# Patient Record
Sex: Female | Born: 1960 | Race: Black or African American | Hispanic: No | Marital: Single | Smoking: Never smoker
Health system: Southern US, Community
[De-identification: ages and names within clinical notes are randomized; demographics above are authoritative.]

## PROBLEM LIST (undated history)

## (undated) DIAGNOSIS — A048 Other specified bacterial intestinal infections: Secondary | ICD-10-CM

## (undated) DIAGNOSIS — M1990 Unspecified osteoarthritis, unspecified site: Secondary | ICD-10-CM

## (undated) DIAGNOSIS — Z8711 Personal history of peptic ulcer disease: Secondary | ICD-10-CM

## (undated) DIAGNOSIS — Z5189 Encounter for other specified aftercare: Secondary | ICD-10-CM

## (undated) DIAGNOSIS — I639 Cerebral infarction, unspecified: Secondary | ICD-10-CM

## (undated) DIAGNOSIS — E119 Type 2 diabetes mellitus without complications: Secondary | ICD-10-CM

## (undated) DIAGNOSIS — M5430 Sciatica, unspecified side: Secondary | ICD-10-CM

## (undated) DIAGNOSIS — E785 Hyperlipidemia, unspecified: Secondary | ICD-10-CM

## (undated) DIAGNOSIS — K219 Gastro-esophageal reflux disease without esophagitis: Secondary | ICD-10-CM

## (undated) DIAGNOSIS — Z8719 Personal history of other diseases of the digestive system: Secondary | ICD-10-CM

## (undated) HISTORY — PX: TUBAL LIGATION: SHX77

## (undated) HISTORY — DX: Encounter for other specified aftercare: Z51.89

## (undated) HISTORY — DX: Hyperlipidemia, unspecified: E78.5

## (undated) HISTORY — DX: Unspecified osteoarthritis, unspecified site: M19.90

---

## 2000-08-05 ENCOUNTER — Emergency Department (HOSPITAL_COMMUNITY): Admission: EM | Admit: 2000-08-05 | Discharge: 2000-08-05 | Payer: Self-pay | Admitting: Internal Medicine

## 2000-08-05 ENCOUNTER — Encounter: Payer: Self-pay | Admitting: Internal Medicine

## 2000-08-15 ENCOUNTER — Emergency Department (HOSPITAL_COMMUNITY): Admission: EM | Admit: 2000-08-15 | Discharge: 2000-08-15 | Payer: Self-pay | Admitting: Emergency Medicine

## 2004-04-22 ENCOUNTER — Emergency Department (HOSPITAL_COMMUNITY): Admission: EM | Admit: 2004-04-22 | Discharge: 2004-04-22 | Payer: Self-pay | Admitting: Emergency Medicine

## 2007-02-17 ENCOUNTER — Emergency Department (HOSPITAL_COMMUNITY): Admission: EM | Admit: 2007-02-17 | Discharge: 2007-02-17 | Payer: Self-pay | Admitting: Emergency Medicine

## 2007-10-08 ENCOUNTER — Emergency Department (HOSPITAL_COMMUNITY): Admission: EM | Admit: 2007-10-08 | Discharge: 2007-10-08 | Payer: Self-pay | Admitting: Emergency Medicine

## 2008-08-24 ENCOUNTER — Emergency Department (HOSPITAL_COMMUNITY): Admission: EM | Admit: 2008-08-24 | Discharge: 2008-08-24 | Payer: Self-pay | Admitting: Emergency Medicine

## 2010-05-11 ENCOUNTER — Emergency Department (HOSPITAL_COMMUNITY)
Admission: EM | Admit: 2010-05-11 | Discharge: 2010-05-11 | Payer: Self-pay | Source: Home / Self Care | Admitting: Emergency Medicine

## 2010-06-09 ENCOUNTER — Emergency Department (HOSPITAL_COMMUNITY)
Admission: EM | Admit: 2010-06-09 | Discharge: 2010-06-10 | Payer: Self-pay | Source: Home / Self Care | Admitting: Emergency Medicine

## 2010-06-14 LAB — CBC
HCT: 37.8 % (ref 36.0–46.0)
Hemoglobin: 12.4 g/dL (ref 12.0–15.0)
MCH: 27.8 pg (ref 26.0–34.0)
MCHC: 32.8 g/dL (ref 30.0–36.0)
MCV: 84.8 fL (ref 78.0–100.0)
Platelets: 233 10*3/uL (ref 150–400)
RBC: 4.46 MIL/uL (ref 3.87–5.11)
RDW: 13.3 % (ref 11.5–15.5)
WBC: 7.7 10*3/uL (ref 4.0–10.5)

## 2010-06-14 LAB — COMPREHENSIVE METABOLIC PANEL
ALT: 14 U/L (ref 0–35)
AST: 19 U/L (ref 0–37)
Albumin: 3.7 g/dL (ref 3.5–5.2)
Alkaline Phosphatase: 43 U/L (ref 39–117)
BUN: 6 mg/dL (ref 6–23)
CO2: 26 mEq/L (ref 19–32)
Calcium: 9.2 mg/dL (ref 8.4–10.5)
Chloride: 103 mEq/L (ref 96–112)
Creatinine, Ser: 0.81 mg/dL (ref 0.4–1.2)
GFR calc Af Amer: >60 mL/min (ref >60)
GFR calc non Af Amer: >60 mL/min (ref >60)
Glucose, Bld: 139 mg/dL — ABNORMAL HIGH (ref 70–99)
Potassium: 3.9 mEq/L (ref 3.5–5.1)
Sodium: 136 mEq/L (ref 135–145)
Total Bilirubin: 0.4 mg/dL (ref 0.3–1.2)
Total Protein: 7.2 g/dL (ref 6.0–8.3)

## 2010-06-14 LAB — DIFFERENTIAL
Basophils Absolute: 0 10*3/uL (ref 0.0–0.1)
Basophils Relative: 0 % (ref 0–1)
Eosinophils Absolute: 0.1 10*3/uL (ref 0.0–0.7)
Eosinophils Relative: 1 % (ref 0–5)
Lymphocytes Relative: 14 % (ref 12–46)
Lymphs Abs: 1.1 10*3/uL (ref 0.7–4.0)
Monocytes Absolute: 0.5 10*3/uL (ref 0.1–1.0)
Monocytes Relative: 7 % (ref 3–12)
Neutro Abs: 6.1 10*3/uL (ref 1.7–7.7)
Neutrophils Relative %: 79 % — ABNORMAL HIGH (ref 43–77)

## 2010-06-14 LAB — LIPASE, BLOOD: Lipase: 19 U/L (ref 11–59)

## 2011-03-10 LAB — COMPREHENSIVE METABOLIC PANEL
ALT: 21
AST: 32
Albumin: 4.3
Alkaline Phosphatase: 45
BUN: 4 — ABNORMAL LOW
CO2: 26
Calcium: 9.3
Chloride: 100
Creatinine, Ser: 0.88
GFR calc Af Amer: >60
GFR calc non Af Amer: >60
Glucose, Bld: 127 — ABNORMAL HIGH
Potassium: 4.6
Sodium: 134 — ABNORMAL LOW
Total Bilirubin: 1.1
Total Protein: 7.4

## 2011-03-10 LAB — DIFFERENTIAL
Basophils Absolute: 0
Basophils Relative: 0
Eosinophils Absolute: 0
Eosinophils Relative: 1
Lymphocytes Relative: 13
Lymphs Abs: 0.8
Monocytes Absolute: 0.2
Monocytes Relative: 4
Neutro Abs: 4.9
Neutrophils Relative %: 82 — ABNORMAL HIGH

## 2011-03-10 LAB — URINALYSIS, ROUTINE W REFLEX MICROSCOPIC
Bilirubin Urine: NEGATIVE
Glucose, UA: NEGATIVE
Hgb urine dipstick: NEGATIVE
Ketones, ur: >80 — AB
Nitrite: NEGATIVE
Protein, ur: 30 — AB
Specific Gravity, Urine: 1.03
Urobilinogen, UA: 1
pH: 7.5

## 2011-03-10 LAB — CBC
HCT: 40
Hemoglobin: 13.1
MCHC: 32.7
MCV: 85.1
Platelets: 214
RBC: 4.71
RDW: 13.8
WBC: 6

## 2011-03-10 LAB — LIPASE, BLOOD: Lipase: 16

## 2011-03-10 LAB — URINE MICROSCOPIC-ADD ON

## 2012-01-10 ENCOUNTER — Emergency Department (HOSPITAL_COMMUNITY)
Admission: EM | Admit: 2012-01-10 | Discharge: 2012-01-10 | Disposition: A | Discharge status: Home / Self Care | Payer: Self-pay | Attending: Emergency Medicine | Admitting: Emergency Medicine

## 2012-01-10 ENCOUNTER — Encounter (HOSPITAL_COMMUNITY): Payer: Self-pay | Admitting: Emergency Medicine

## 2012-01-10 ENCOUNTER — Emergency Department (HOSPITAL_COMMUNITY): Payer: Self-pay

## 2012-01-10 DIAGNOSIS — K219 Gastro-esophageal reflux disease without esophagitis: Secondary | ICD-10-CM | POA: Insufficient documentation

## 2012-01-10 DIAGNOSIS — J189 Pneumonia, unspecified organism: Secondary | ICD-10-CM | POA: Insufficient documentation

## 2012-01-10 HISTORY — DX: Sciatica, unspecified side: M54.30

## 2012-01-10 HISTORY — DX: Personal history of other diseases of the digestive system: Z87.19

## 2012-01-10 HISTORY — DX: Gastro-esophageal reflux disease without esophagitis: K21.9

## 2012-01-10 HISTORY — DX: Personal history of peptic ulcer disease: Z87.11

## 2012-01-10 LAB — CBC WITH DIFFERENTIAL/PLATELET
Basophils Absolute: 0.1 10*3/uL (ref 0.0–0.1)
Basophils Relative: 1 % (ref 0–1)
Eosinophils Absolute: 0.4 10*3/uL (ref 0.0–0.7)
Eosinophils Relative: 5 % (ref 0–5)
HCT: 34.9 % — ABNORMAL LOW (ref 36.0–46.0)
Hemoglobin: 11.4 g/dL — ABNORMAL LOW (ref 12.0–15.0)
Lymphocytes Relative: 17 % (ref 12–46)
Lymphs Abs: 1.4 10*3/uL (ref 0.7–4.0)
MCH: 27.5 pg (ref 26.0–34.0)
MCHC: 32.7 g/dL (ref 30.0–36.0)
MCV: 84.1 fL (ref 78.0–100.0)
Monocytes Absolute: 0.7 10*3/uL (ref 0.1–1.0)
Monocytes Relative: 9 % (ref 3–12)
Neutro Abs: 5.8 10*3/uL (ref 1.7–7.7)
Neutrophils Relative %: 69 % (ref 43–77)
Platelets: 394 10*3/uL (ref 150–400)
RBC: 4.15 MIL/uL (ref 3.87–5.11)
RDW: 13.3 % (ref 11.5–15.5)
WBC: 8.4 10*3/uL (ref 4.0–10.5)

## 2012-01-10 LAB — BASIC METABOLIC PANEL
BUN: 6 mg/dL (ref 6–23)
CO2: 27 mEq/L (ref 19–32)
Calcium: 9.3 mg/dL (ref 8.4–10.5)
Chloride: 103 mEq/L (ref 96–112)
Creatinine, Ser: 0.75 mg/dL (ref 0.50–1.10)
GFR calc Af Amer: >90 mL/min (ref >90)
GFR calc non Af Amer: >90 mL/min (ref >90)
Glucose, Bld: 92 mg/dL (ref 70–99)
Potassium: 4.2 mEq/L (ref 3.5–5.1)
Sodium: 138 mEq/L (ref 135–145)

## 2012-01-10 MED ORDER — AZITHROMYCIN 250 MG PO TABS
500.0000 mg | ORAL_TABLET | Freq: Once | ORAL | Status: AC
  Administered 2012-01-10: 500 mg via ORAL
  Filled 2012-01-10: qty 2

## 2012-01-10 MED ORDER — AZITHROMYCIN 250 MG PO TABS: 500.0000 mg | ORAL_TABLET | Freq: Every day | ORAL | Status: DC

## 2012-01-10 MED ORDER — PREDNISONE 20 MG PO TABS
60.0000 mg | ORAL_TABLET | Freq: Once | ORAL | Status: AC
  Administered 2012-01-10: 60 mg via ORAL
  Filled 2012-01-10: qty 3

## 2012-01-10 MED ORDER — ALBUTEROL SULFATE HFA 108 (90 BASE) MCG/ACT IN AERS
2.0000 | INHALATION_SPRAY | RESPIRATORY_TRACT | Status: DC | PRN
  Filled 2012-01-10: qty 6.7

## 2012-01-10 MED ORDER — ALBUTEROL SULFATE (5 MG/ML) 0.5% IN NEBU
5.0000 mg | INHALATION_SOLUTION | Freq: Once | RESPIRATORY_TRACT | Status: AC
  Administered 2012-01-10: 5 mg via RESPIRATORY_TRACT
  Filled 2012-01-10: qty 1

## 2012-01-10 MED ORDER — AZITHROMYCIN 500 MG PO TABS: 500.0000 mg | ORAL_TABLET | Freq: Every day | ORAL | Status: DC

## 2012-01-10 MED ORDER — IPRATROPIUM BROMIDE 0.02 % IN SOLN
0.5000 mg | Freq: Once | RESPIRATORY_TRACT | Status: AC
  Administered 2012-01-10: 0.5 mg via RESPIRATORY_TRACT
  Filled 2012-01-10: qty 2.5

## 2012-01-10 MED ORDER — AZITHROMYCIN 500 MG PO TABS: 500.0000 mg | ORAL_TABLET | Freq: Every day | ORAL | Status: AC

## 2012-01-10 MED ORDER — DEXTROSE 5 % IV SOLN: 500.0000 mg | Freq: Once | INTRAVENOUS | Status: DC

## 2012-01-10 NOTE — ED Notes (Signed)
Tight sounding cough started 1 week ago, unable to get rid of it. Lungs diminished all lobes

## 2012-01-10 NOTE — Progress Notes (Signed)
CM spoke with pt who confirms self pay [facility] resident with no pcp. Discussed the importance of a pcp for f/u. Reviewed Health connect number to assist with finding self pay provider close to pt's residence. Reviewed resources for [facility], general medical clinics, medications-[email or web address], [facility] out patient [facility]ies, housing, DSS, health Department and other resources in [employer] [employer]. Pt voiced understanding and appreciation of resources provided Pt states she was told to return in 2 weeks for a follow up to the [facility] ED by ED staff

## 2012-01-10 NOTE — ED Notes (Signed)
Pt c/o of dry cough that started about a week ago but has progressively gotten worse with today being the worse day.  Pt denies coughing up anything.  Pt reports SOB, gasping for air, some weakness "felt like my body broke down".  Pt reports headache 10/10.

## 2012-01-10 NOTE — ED Provider Notes (Signed)
History     CSN: [id number]  Arrival date & time 01/10/12  1344   First MD Initiated Contact with Patient 01/10/12 1456      Chief Complaint  Patient presents with  . Cough    (Consider location/radiation/quality/duration/timing/severity/associated sxs/prior treatment) HPI  Patient presents to the emergency department with complaints of cough started one week ago that she has been unable to read. She states that and to attend the cough is an. However, that he she also developed a low-grade fever 100, and pick, and he can't D. She denies feeling weak or having nausea, vomiting, diarrhea, headaches, COPD. The patient informs me that she does not feel well and decided to come and get evaluated. The patient does have low-grade temp, but otherwise her for are stable.  Past Medical History  Diagnosis Date  . GERD (gastroesophageal reflux disease)   . History of stomach ulcers   . Sciatica     History reviewed. No pertinent past surgical history.  History reviewed. No pertinent family history.  History  Substance Use Topics  . Smoking status: Never Smoker   . Smokeless tobacco: Not on file  . Alcohol Use: No    OB History    Grav Para Term Preterm Abortions TAB SAB Ect Mult Living                  Review of Systems   HEENT: denies blurry vision or change in hearing PULMONARY: + wheezing CARDIAC: denies chest pain or heart palpitations MUSCULOSKELETAL:  denies being unable to ambulate ABDOMEN AL: denies abdominal pain GU: denies loss of bowel or urinary control NEURO: denies numbness and tingling in extremities SKIN: no new rashes PSYCH: patient denies anxiety or depression. NECK: Pt denies having neck pain     Allergies  Penicillins  Home Medications   Current Outpatient Rx  Name Route Sig Dispense Refill  . TYLENOL COLD/FLU SEVERE PO Oral Take 1 tablet by mouth every 6 (six) hours as needed. For cold symptoms.    . AZITHROMYCIN 250 MG PO TABS Oral Take 2  tablets (500 mg total) by mouth daily. Take first 2 tablets together, then 1 every day until finished. 2 tablet 0    BP 116/70  Pulse 107  Temp 99.8 F (37.7 C) (Oral)  Resp 18  SpO2 97%  LMP 01/02/2012  Physical Exam  Nursing note and vitals reviewed. Constitutional: She appears well-developed and well-nourished. No distress.  HENT:  Head: Normocephalic and atraumatic.  Eyes: Pupils are equal, round, and reactive to light.  Neck: Normal range of motion. Neck supple.  Cardiovascular: Normal rate, regular rhythm and normal heart sounds.   No murmur heard. Pulmonary/Chest: Effort normal. No respiratory distress. She has wheezes. She has rales (left lower lobe). She exhibits no tenderness.  Abdominal: Soft. There is no tenderness.  Neurological: She is alert.  Skin: Skin is warm and dry.    ED Course  Procedures (including critical care time)  Labs Reviewed  CBC WITH DIFFERENTIAL - Abnormal; Notable for the following:    Hemoglobin 11.4 (*)     HCT 34.9 (*)     All other components within normal limits  BASIC METABOLIC PANEL   Dg Chest 2 View  01/10/2012  *RADIOLOGY REPORT*  Clinical Data: Cough, shortness of breath.  CHEST - 2 VIEW  Comparison: 10/08/2007  Findings: Consolidation in the left lower lobe compatible with pneumonia.  Right lung is clear.  No effusions.  No acute bony abnormality.  Heart is normal size.  The  IMPRESSION: Left lower lobe pneumonia.  Original Report Authenticated By: Cyndie Chime, M.D.     1. CAP (community acquired pneumonia)       MDM  Patients xray diagnostic of pneumonia. Pt not having high fevers, SOB, her pulse ox is 100 % in room air. After resting in room she is no longer tachycardic. Patient declines IV abx, she also declines IM abx. I was concerned about her compliance but she refuses to stay. I have ordered 50mg  PO Azithromycin in the ED and Azithromycin PO 500mg  x 2 days starting tomorrow (per up-to-date guidelines) in hopes that  she will take it for two days.  Patient is in stable condition and appears well. She has been given a breathing tx in ED as well an inhaler to go home with. No PCP, will return to the ER.  Pt has been advised of the symptoms that warrant their return to the ED. Patient has voiced understanding and has agreed to follow-up with the PCP or specialist.         Dorthula Matas, PA 01/11/12 2030

## 2012-01-12 ENCOUNTER — Emergency Department (HOSPITAL_COMMUNITY): Payer: Self-pay

## 2012-01-12 ENCOUNTER — Encounter (HOSPITAL_COMMUNITY): Payer: Self-pay | Admitting: Emergency Medicine

## 2012-01-12 ENCOUNTER — Emergency Department (HOSPITAL_COMMUNITY)
Admission: EM | Admit: 2012-01-12 | Discharge: 2012-01-12 | Disposition: A | Discharge status: Home / Self Care | Payer: Self-pay | Attending: Emergency Medicine | Admitting: Emergency Medicine

## 2012-01-12 DIAGNOSIS — J189 Pneumonia, unspecified organism: Secondary | ICD-10-CM | POA: Insufficient documentation

## 2012-01-12 DIAGNOSIS — J9801 Acute bronchospasm: Secondary | ICD-10-CM | POA: Insufficient documentation

## 2012-01-12 DIAGNOSIS — K219 Gastro-esophageal reflux disease without esophagitis: Secondary | ICD-10-CM | POA: Insufficient documentation

## 2012-01-12 LAB — CBC WITH DIFFERENTIAL/PLATELET
Basophils Absolute: 0.1 10*3/uL (ref 0.0–0.1)
Basophils Relative: 1 % (ref 0–1)
Eosinophils Absolute: 0.4 10*3/uL (ref 0.0–0.7)
Eosinophils Relative: 4 % (ref 0–5)
HCT: 37 % (ref 36.0–46.0)
Hemoglobin: 12.1 g/dL (ref 12.0–15.0)
Lymphocytes Relative: 28 % (ref 12–46)
Lymphs Abs: 2.3 10*3/uL (ref 0.7–4.0)
MCH: 27.6 pg (ref 26.0–34.0)
MCHC: 32.7 g/dL (ref 30.0–36.0)
MCV: 84.3 fL (ref 78.0–100.0)
Monocytes Absolute: 0.6 10*3/uL (ref 0.1–1.0)
Monocytes Relative: 7 % (ref 3–12)
Neutro Abs: 5 10*3/uL (ref 1.7–7.7)
Neutrophils Relative %: 60 % (ref 43–77)
Platelets: 410 10*3/uL — ABNORMAL HIGH (ref 150–400)
RBC: 4.39 MIL/uL (ref 3.87–5.11)
RDW: 13.4 % (ref 11.5–15.5)
WBC: 8.4 10*3/uL (ref 4.0–10.5)

## 2012-01-12 LAB — COMPREHENSIVE METABOLIC PANEL
ALT: 16 U/L (ref 0–35)
AST: 18 U/L (ref 0–37)
Albumin: 3 g/dL — ABNORMAL LOW (ref 3.5–5.2)
Alkaline Phosphatase: 58 U/L (ref 39–117)
BUN: 9 mg/dL (ref 6–23)
CO2: 27 mEq/L (ref 19–32)
Calcium: 9.1 mg/dL (ref 8.4–10.5)
Chloride: 100 mEq/L (ref 96–112)
Creatinine, Ser: 0.74 mg/dL (ref 0.50–1.10)
GFR calc Af Amer: >90 mL/min (ref >90)
GFR calc non Af Amer: >90 mL/min (ref >90)
Glucose, Bld: 102 mg/dL — ABNORMAL HIGH (ref 70–99)
Potassium: 3.6 mEq/L (ref 3.5–5.1)
Sodium: 136 mEq/L (ref 135–145)
Total Bilirubin: 0.2 mg/dL — ABNORMAL LOW (ref 0.3–1.2)
Total Protein: 7.3 g/dL (ref 6.0–8.3)

## 2012-01-12 MED ORDER — IPRATROPIUM BROMIDE 0.02 % IN SOLN
0.5000 mg | Freq: Once | RESPIRATORY_TRACT | Status: AC
  Administered 2012-01-12: 0.5 mg via RESPIRATORY_TRACT
  Filled 2012-01-12: qty 2.5

## 2012-01-12 MED ORDER — ALBUTEROL SULFATE (5 MG/ML) 0.5% IN NEBU
5.0000 mg | INHALATION_SOLUTION | Freq: Once | RESPIRATORY_TRACT | Status: AC
  Administered 2012-01-12: 5 mg via RESPIRATORY_TRACT
  Filled 2012-01-12: qty 1

## 2012-01-12 MED ORDER — MOXIFLOXACIN HCL 400 MG PO TABS
400.0000 mg | ORAL_TABLET | Freq: Once | ORAL | Status: AC
  Administered 2012-01-12: 400 mg via ORAL
  Filled 2012-01-12: qty 1

## 2012-01-12 NOTE — ED Provider Notes (Signed)
History     CSN: [id number]  Arrival date & time 01/12/12  0222   First MD Initiated Contact with Patient 01/12/12 [phone]      Chief Complaint  Patient presents with  . Shortness of Breath    The history is provided by the patient.   the patient reports she was recently diagnosed with pneumonia and is currently on azithromycin.  She presents now because of persistent worsening cough.  She still has mild shortness of breath but this has not changed from her prior evaluation.  She reports she's been trying her albuterol inhaler that was given to her without any relief.  She only used it about 2 times yesterday.  She denies fevers and chills.  She has no unilateral leg swelling.  She has no prior history of DVT or pulmonary embolism.  She's tolerating her azithromycin without difficulty.  Her main reason for presenting today is because she had 2 episodes of posttussive emesis reported that she could not stop coughing.  Past Medical History  Diagnosis Date  . GERD (gastroesophageal reflux disease)   . History of stomach ulcers   . Sciatica     History reviewed. No pertinent past surgical history.  No family history on file.  History  Substance Use Topics  . Smoking status: Never Smoker   . Smokeless tobacco: Not on file  . Alcohol Use: No    OB History    Grav Para Term Preterm Abortions TAB SAB Ect Mult Living                  Review of Systems  Respiratory: Positive for shortness of breath.   All other systems reviewed and are negative.    Allergies  Penicillins  Home Medications   Current Outpatient Rx  Name Route Sig Dispense Refill  . ALBUTEROL SULFATE HFA 108 (90 BASE) MCG/ACT IN AERS Inhalation Inhale 2 puffs into the lungs every 6 (six) hours as needed. For shortness of breath    . AZITHROMYCIN 500 MG PO TABS Oral Take 1 tablet (500 mg total) by mouth daily. 2 tablet 0  . TYLENOL COLD/FLU SEVERE PO Oral Take 1 tablet by mouth once. For cold symptoms.       BP 129/70  Pulse 83  Temp 98.9 F (37.2 C) (Oral)  Resp 16  SpO2 99%  LMP 01/02/2012  Physical Exam  Nursing note and vitals reviewed. Constitutional: She is oriented to person, place, and time. She appears well-developed and well-nourished. No distress.  HENT:  Head: Normocephalic and atraumatic.  Eyes: EOM are normal.  Neck: Normal range of motion.  Cardiovascular: Normal rate, regular rhythm and normal heart sounds.   Pulmonary/Chest: Effort normal. No respiratory distress. She has wheezes.  Abdominal: Soft. She exhibits no distension. There is no tenderness.  Musculoskeletal: Normal range of motion.  Neurological: She is alert and oriented to person, place, and time.  Skin: Skin is warm and dry.  Psychiatric: She has a normal mood and affect. Judgment normal.    ED Course  Procedures (including critical care time)  Labs Reviewed  CBC WITH DIFFERENTIAL - Abnormal; Notable for the following:    Platelets 410 (*)     All other components within normal limits  COMPREHENSIVE METABOLIC PANEL - Abnormal; Notable for the following:    Glucose, Bld 102 (*)     Albumin 3.0 (*)     Total Bilirubin 0.2 (*)     All other components within normal limits  Dg Chest 2 View  01/12/2012  *RADIOLOGY REPORT*  Clinical Data: Short of breath.  Pneumonia.  CHEST - 2 VIEW  Comparison: 01/10/2012.  Findings: Unchanged left lower lobe pneumonia with dense retrocardiac consolidation.  Right lung appears similar. Cardiopericardial silhouette within normal limits. Follow-up to ensure radiographic clearing recommended.  Clearing is usually observed at 8 weeks.  IMPRESSION: Unchanged left lower lobe pneumonia, as expected for this time frame.  Original Report Authenticated By: Andreas Newport, M.D.   Dg Chest 2 View  01/10/2012  *RADIOLOGY REPORT*  Clinical Data: Cough, shortness of breath.  CHEST - 2 VIEW  Comparison: 10/08/2007  Findings: Consolidation in the left lower lobe compatible with  pneumonia.  Right lung is clear.  No effusions.  No acute bony abnormality.  Heart is normal size.  The  IMPRESSION: Left lower lobe pneumonia.  Original Report Authenticated By: Cyndie Chime, M.D.    I personally reviewed the imaging tests through PACS system  I reviewed available ER/hospitalization records thought the EMR   1. Bronchospasm   2. CAP (community acquired pneumonia)       MDM  The patient had a significant amount spasm on exam.  She feels much better after albuterol and Atrovent nebulizers in the emergency department.  I instructed her that she is to use her albuterol inhaler every 4 hours for the next 2 days and as needed in between.  She reports she was not using it that often.  She does not smoke cigarettes.  She's given a dose of Avelox in the emergency department for what appears to be community-acquired pneumonia.  This should length another course of antibiotics for 24 hours.  She does not have insurance and reports she would not have the ability to pay for additional antibiotics and therefore cannot prescribe the patient Levaquin or Avelox.  Azithromycin shared he has and this is the best that she'll be able to afford.  Chest x-ray is unchanged.  O2 sats are 99% on room air   Lyanne Co, MD 01/12/12 [phone]

## 2012-01-12 NOTE — ED Notes (Signed)
Pt c/o SHOB x 1 week, emesis, cough. Pt seen here 8/13, pt states she is worse now.

## 2012-01-13 NOTE — ED Provider Notes (Signed)
Medical screening examination/treatment/procedure(s) were performed by non-physician practitioner and as supervising physician I was immediately available for consultation/collaboration.   Suzi Roots, MD 01/13/12 0600

## 2013-03-14 ENCOUNTER — Encounter (HOSPITAL_COMMUNITY): Payer: Self-pay | Admitting: Emergency Medicine

## 2013-03-14 ENCOUNTER — Emergency Department (HOSPITAL_COMMUNITY)
Admission: EM | Admit: 2013-03-14 | Discharge: 2013-03-14 | Disposition: A | Discharge status: Home / Self Care | Payer: No Typology Code available for payment source | Attending: Emergency Medicine | Admitting: Emergency Medicine

## 2013-03-14 ENCOUNTER — Emergency Department (HOSPITAL_COMMUNITY): Payer: No Typology Code available for payment source

## 2013-03-14 DIAGNOSIS — S46909A Unspecified injury of unspecified muscle, fascia and tendon at shoulder and upper arm level, unspecified arm, initial encounter: Secondary | ICD-10-CM | POA: Insufficient documentation

## 2013-03-14 DIAGNOSIS — Y92410 Unspecified street and highway as the place of occurrence of the external cause: Secondary | ICD-10-CM | POA: Insufficient documentation

## 2013-03-14 DIAGNOSIS — Y9389 Activity, other specified: Secondary | ICD-10-CM | POA: Insufficient documentation

## 2013-03-14 DIAGNOSIS — IMO0002 Reserved for concepts with insufficient information to code with codable children: Secondary | ICD-10-CM | POA: Insufficient documentation

## 2013-03-14 DIAGNOSIS — Z8739 Personal history of other diseases of the musculoskeletal system and connective tissue: Secondary | ICD-10-CM | POA: Insufficient documentation

## 2013-03-14 DIAGNOSIS — Z880 Allergy status to penicillin: Secondary | ICD-10-CM | POA: Insufficient documentation

## 2013-03-14 DIAGNOSIS — S298XXA Other specified injuries of thorax, initial encounter: Secondary | ICD-10-CM | POA: Insufficient documentation

## 2013-03-14 DIAGNOSIS — S4980XA Other specified injuries of shoulder and upper arm, unspecified arm, initial encounter: Secondary | ICD-10-CM | POA: Insufficient documentation

## 2013-03-14 DIAGNOSIS — Z8719 Personal history of other diseases of the digestive system: Secondary | ICD-10-CM | POA: Insufficient documentation

## 2013-03-14 MED ORDER — NAPROXEN SODIUM 220 MG PO TABS: 220.0000 mg | ORAL_TABLET | Freq: Two times a day (BID) | ORAL | Status: DC | PRN

## 2013-03-14 MED ORDER — KETOROLAC TROMETHAMINE 60 MG/2ML IM SOLN
60.0000 mg | Freq: Once | INTRAMUSCULAR | Status: DC
  Filled 2013-03-14: qty 2

## 2013-03-14 MED ORDER — IBUPROFEN 400 MG PO TABS
600.0000 mg | ORAL_TABLET | Freq: Once | ORAL | Status: AC
  Administered 2013-03-14: 600 mg via ORAL
  Filled 2013-03-14 (×2): qty 1

## 2013-03-14 MED ORDER — CYCLOBENZAPRINE HCL 10 MG PO TABS: 10.0000 mg | ORAL_TABLET | Freq: Two times a day (BID) | ORAL | Status: DC | PRN
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
## 2013-03-14 NOTE — ED Notes (Signed)
Pt returned from xray
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
## 2013-03-19 NOTE — ED Provider Notes (Signed)
Medical screening examination/treatment/procedure(s) were performed by non-physician practitioner and as supervising physician I was immediately available for consultation/collaboration.  Severo Beber Y. Breck [place], MD 03/19/13 0043 

## 2013-10-12 ENCOUNTER — Encounter (HOSPITAL_COMMUNITY): Payer: Self-pay | Admitting: Emergency Medicine

## 2013-10-12 ENCOUNTER — Emergency Department (HOSPITAL_COMMUNITY)
Admission: EM | Admit: 2013-10-12 | Discharge: 2013-10-12 | Disposition: A | Discharge status: Home / Self Care | Payer: No Typology Code available for payment source | Attending: Emergency Medicine | Admitting: Emergency Medicine

## 2013-10-12 DIAGNOSIS — Z880 Allergy status to penicillin: Secondary | ICD-10-CM | POA: Insufficient documentation

## 2013-10-12 DIAGNOSIS — Z8739 Personal history of other diseases of the musculoskeletal system and connective tissue: Secondary | ICD-10-CM | POA: Insufficient documentation

## 2013-10-12 DIAGNOSIS — K0381 Cracked tooth: Secondary | ICD-10-CM | POA: Insufficient documentation

## 2013-10-12 DIAGNOSIS — K089 Disorder of teeth and supporting structures, unspecified: Secondary | ICD-10-CM | POA: Insufficient documentation

## 2013-10-12 DIAGNOSIS — H9209 Otalgia, unspecified ear: Secondary | ICD-10-CM | POA: Insufficient documentation

## 2013-10-12 DIAGNOSIS — H9202 Otalgia, left ear: Secondary | ICD-10-CM

## 2013-10-12 DIAGNOSIS — K0889 Other specified disorders of teeth and supporting structures: Secondary | ICD-10-CM

## 2013-10-12 DIAGNOSIS — Z8719 Personal history of other diseases of the digestive system: Secondary | ICD-10-CM | POA: Insufficient documentation

## 2013-10-12 MED ORDER — ANTIPYRINE-BENZOCAINE 5.4-1.4 % OT SOLN
3.0000 [drp] | Freq: Once | OTIC | Status: AC
  Administered 2013-10-12: 4 [drp] via OTIC
  Filled 2013-10-12: qty 10

## 2013-10-12 MED ORDER — CLINDAMYCIN HCL 150 MG PO CAPS: 300.0000 mg | ORAL_CAPSULE | Freq: Three times a day (TID) | ORAL | Status: DC

## 2013-10-12 MED ORDER — TRAMADOL HCL 50 MG PO TABS: 50.0000 mg | ORAL_TABLET | Freq: Four times a day (QID) | ORAL | Status: DC | PRN

## 2013-10-12 NOTE — Discharge Instructions (Signed)
Dental Pain A tooth ache may be caused by cavities (tooth decay). Cavities expose the nerve of the tooth to air and hot or cold temperatures. It may come from an infection or abscess (also called a boil or furuncle) around your tooth. It is also often caused by dental caries (tooth decay). This causes the pain you are having. DIAGNOSIS  Your caregiver can diagnose this problem by exam. TREATMENT   If caused by an infection, it may be treated with medications which kill germs (antibiotics) and pain medications as prescribed by your caregiver. Take medications as directed.  Only take over-the-counter or prescription medicines for pain, discomfort, or fever as directed by your caregiver.  Whether the tooth ache today is caused by infection or dental disease, you should see your dentist as soon as possible for further care. SEEK MEDICAL CARE IF: The exam and treatment you received today has been provided on an emergency basis only. This is not a substitute for complete medical or dental care. If your problem worsens or new problems (symptoms) appear, and you are unable to meet with your dentist, call or return to this location. SEEK IMMEDIATE MEDICAL CARE IF:   You have a fever.  You develop redness and swelling of your face, jaw, or neck.  You are unable to open your mouth.  You have severe pain uncontrolled by pain medicine. MAKE SURE YOU:   Understand these instructions.  Will watch your condition.  Will get help right away if you are not doing well or get worse. Document Released: 05/16/2005 Document Revised: 08/08/2011 Document Reviewed: 01/02/2008 [facility] Patient Information 2014 [place], [place].  Otalgia The most common reason for this in children is an infection of the middle ear. Pain from the middle ear is usually caused by a build-up of fluid and pressure behind the eardrum. Pain from an earache can be sharp, dull, or burning. The pain may be temporary or constant. The middle  ear is connected to the nasal passages by a short narrow tube called the Eustachian tube. The Eustachian tube allows fluid to drain out of the middle ear, and helps keep the pressure in your ear equalized. CAUSES  A cold or allergy can block the Eustachian tube with inflammation and the build-up of secretions. This is especially likely in small children, because their Eustachian tube is shorter and more horizontal. When the Eustachian tube closes, the normal flow of fluid from the middle ear is stopped. Fluid can accumulate and cause stuffiness, pain, hearing loss, and an ear infection if germs start growing in this area. SYMPTOMS  The symptoms of an ear infection may include fever, ear pain, fussiness, increased crying, and irritability. Many children will have temporary and minor hearing loss during and right after an ear infection. Permanent hearing loss is rare, but the risk increases the more infections a child has. Other causes of ear pain include retained water in the outer ear canal from swimming and bathing. Ear pain in adults is less likely to be from an ear infection. Ear pain may be referred from other locations. Referred pain may be from the joint between your jaw and the skull. It may also come from a tooth problem or problems in the neck. Other causes of ear pain include:  A foreign body in the ear.  Outer ear infection.  Sinus infections.  Impacted ear wax.  Ear injury.  Arthritis of the jaw or TMJ problems.  Middle ear infection.  Tooth infections.  Sore throat with  pain to the ears. DIAGNOSIS  Your caregiver can usually make the diagnosis by examining you. Sometimes other special studies, including x-rays and lab work may be necessary. TREATMENT   If antibiotics were prescribed, use them as directed and finish them even if you or your child's symptoms seem to be improved.  Sometimes PE tubes are needed in children. These are little plastic tubes which are put into the  eardrum during a simple surgical procedure. They allow fluid to drain easier and allow the pressure in the middle ear to equalize. This helps relieve the ear pain caused by pressure changes. HOME CARE INSTRUCTIONS   Only take over-the-counter or prescription medicines for pain, discomfort, or fever as directed by your caregiver. DO NOT GIVE CHILDREN ASPIRIN because of the association of Reye's Syndrome in children taking aspirin.  Use a cold pack applied to the outer ear for 15-20 minutes, 03-04 times per day or as needed may reduce pain. Do not apply ice directly to the skin. You may cause frost bite.  Over-the-counter ear drops used as directed may be effective. Your caregiver may sometimes prescribe ear drops.  Resting in an upright position may help reduce pressure in the middle ear and relieve pain.  Ear pain caused by rapidly descending from high altitudes can be relieved by swallowing or chewing gum. Allowing infants to suck on a bottle during airplane travel can help.  Do not smoke in the house or near children. If you are unable to quit smoking, smoke outside.  Control allergies. SEEK IMMEDIATE MEDICAL CARE IF:   You or your child are becoming sicker.  Pain or fever relief is not obtained with medicine.  You or your child's symptoms (pain, fever, or irritability) do not improve within 24 to 48 hours or as instructed.  Severe pain suddenly stops hurting. This may indicate a ruptured eardrum.  You or your children develop new problems such as severe headaches, stiff neck, difficulty swallowing, or swelling of the face or around the ear. Document Released: 01/01/2004 Document Revised: 08/08/2011 Document Reviewed: 05/07/2008 [facility]Exit[facility] Patient Information 2014 [place]ExitCare, [place].

## 2013-10-12 NOTE — ED Notes (Signed)
Pt dissatisfied with discharge instructions and prescriptions.  Pt states she has had left ear pain x 5 weeks and wants to know what the cause is.  Advised PA, PA in room to talk with pt.

## 2013-10-12 NOTE — ED Provider Notes (Signed)
CSN: [id number]     Arrival date & time 10/12/13  1442 History  This chart was scribed for non-physician practitioner Junious SilkHannah Beckett Maden, PA-C working with Juliet RudeNathan R. Rubin PayorPickering, MD by Dorothey Basemania Sutton, ED Scribe. This patient was seen in room TR11C/TR11C and the patient's care was started at 3:49 PM.    Chief Complaint  Patient presents with  . Otalgia   The history is provided by the patient. No language interpreter was used.   HPI Comments: Heather Cook is a 53 y.o. female who presents to the Emergency Department complaining of an intermittent pain to the left ear that she states has been ongoing for the past 5 weeks, but has been progressively worsening since last night. Patient states that she notices the pain most after being outside. Patient reports some pain to the left, lower dentition. She denies taking any medications at home to treat her pain. She denies ear drainage, fever, chills, rhinorrhea, congestion, cough, shortness of breath. Patient has an allergy to penicillins. Patient has a history of sciatica, GERD, and stomach ulcers.   Past Medical History  Diagnosis Date  . GERD (gastroesophageal reflux disease)   . History of stomach ulcers   . Sciatica    History reviewed. No pertinent past surgical history. History reviewed. No pertinent family history. History  Substance Use Topics  . Smoking status: Never Smoker   . Smokeless tobacco: Not on file  . Alcohol Use: No   OB History   Grav Para Term Preterm Abortions TAB SAB Ect Mult Living                 Review of Systems  Constitutional: Negative for fever and chills.  HENT: Positive for dental problem and ear pain. Negative for congestion, ear discharge and rhinorrhea.   Respiratory: Negative for cough and shortness of breath.   All other systems reviewed and are negative.  Allergies  Other; Peanuts; Penicillins; and Strawberry  Home Medications   Prior to Admission medications   Not on File   Triage Vitals: BP 125/64   Pulse 94  Temp(Src) 98.2 F (36.8 C) (Oral)  Resp 16  Ht 5' 3.5" (1.613 m)  Wt 192 lb (87.091 kg)  BMI 33.47 kg/m2  SpO2 100%  LMP 09/28/2013  Physical Exam  Nursing note and vitals reviewed. Constitutional: She is oriented to person, place, and time. She appears well-developed and well-nourished. No distress.  HENT:  Head: Normocephalic and atraumatic.  Right Ear: Hearing, tympanic membrane, external ear and ear canal normal. No mastoid tenderness.  Left Ear: Hearing, tympanic membrane, external ear and ear canal normal. No mastoid tenderness.  Nose: Nose normal.  Mouth/Throat: Oropharynx is clear and moist. No oropharyngeal exudate.  Broken lower, left molar. No trismus, submental edema, or tongue elevation.   Eyes: Conjunctivae are normal.  Neck: Normal range of motion.  Cardiovascular: Normal rate, regular rhythm and normal heart sounds.   Pulmonary/Chest: Effort normal and breath sounds normal. No stridor. No respiratory distress. She has no wheezes. She has no rales.  Abdominal: Soft. She exhibits no distension.  Musculoskeletal: Normal range of motion.  Neurological: She is alert and oriented to person, place, and time. She has normal strength.  Skin: Skin is warm and dry. She is not diaphoretic. No erythema.  Psychiatric: She has a normal mood and affect. Her behavior is normal.    ED Course  Procedures (including critical care time)  DIAGNOSTIC STUDIES: Oxygen Saturation is 100% on room air, normal by my interpretation.  COORDINATION OF CARE: 3:54 PM- Discussed that there are no signs of infection at this time. Will discharge patient with Auralgan to manage symptoms. Advised patient to follow up with the referred ENT specialist. Discussed treatment plan with patient at bedside and patient verbalized agreement.     Labs Review Labs Reviewed - No data to display  Imaging Review No results found.   EKG Interpretation None      MDM   Final diagnoses:   Left ear pain  Pain, dental    Patient with toothache.  No gross abscess.  Exam unconcerning for Ludwig's angina or spread of infection.  Will treat with penicillin and pain medicine.  Urged patient to follow-up with dentist.  Patient also with otalgia. This could be radiating from tooth. Ear does not appear infected. No mastoid tenderness. Patient is unhappy with this answer. I offered to have my attending physician to evaluate her and offer a second opinion as she was very upset, but patient declined. She was given referral to ENT. Return instructions given. Vital signs stable for discharge. Patient / Family / Caregiver informed of clinical course, understand medical decision-making process, and agree with plan.   I personally performed the services described in this documentation, which was scribed in my presence. The recorded information has been reviewed and is accurate.    Mora BellmanHannah S Nicholas Ossa, PA-C 10/14/13 1246

## 2013-10-12 NOTE — ED Notes (Signed)
Per pt sts 5 weeks of left ear pain. sts pain got bad last night. sts she noticed that it happens after she has been outside.

## 2013-10-17 NOTE — ED Provider Notes (Signed)
Medical screening examination/treatment/procedure(s) were performed by non-physician practitioner and as supervising physician I was immediately available for consultation/collaboration.   EKG Interpretation None       Juliet RudeNathan R. Rubin PayorPickering, MD 10/17/13 [phone]

## 2014-03-14 ENCOUNTER — Encounter (HOSPITAL_COMMUNITY): Payer: Self-pay | Admitting: Emergency Medicine

## 2014-03-14 ENCOUNTER — Emergency Department (HOSPITAL_COMMUNITY): Payer: No Typology Code available for payment source

## 2014-03-14 ENCOUNTER — Emergency Department (HOSPITAL_COMMUNITY)
Admission: EM | Admit: 2014-03-14 | Discharge: 2014-03-15 | Disposition: A | Discharge status: Home / Self Care | Payer: No Typology Code available for payment source | Attending: Emergency Medicine | Admitting: Emergency Medicine

## 2014-03-14 DIAGNOSIS — R1032 Left lower quadrant pain: Secondary | ICD-10-CM | POA: Insufficient documentation

## 2014-03-14 DIAGNOSIS — Z8739 Personal history of other diseases of the musculoskeletal system and connective tissue: Secondary | ICD-10-CM | POA: Insufficient documentation

## 2014-03-14 DIAGNOSIS — R109 Unspecified abdominal pain: Secondary | ICD-10-CM

## 2014-03-14 DIAGNOSIS — Z79899 Other long term (current) drug therapy: Secondary | ICD-10-CM | POA: Insufficient documentation

## 2014-03-14 DIAGNOSIS — K219 Gastro-esophageal reflux disease without esophagitis: Secondary | ICD-10-CM | POA: Insufficient documentation

## 2014-03-14 DIAGNOSIS — R112 Nausea with vomiting, unspecified: Secondary | ICD-10-CM

## 2014-03-14 DIAGNOSIS — Z880 Allergy status to penicillin: Secondary | ICD-10-CM | POA: Insufficient documentation

## 2014-03-14 DIAGNOSIS — E860 Dehydration: Secondary | ICD-10-CM

## 2014-03-14 LAB — CBC WITH DIFFERENTIAL/PLATELET
Basophils Absolute: 0 10*3/uL (ref 0.0–0.1)
Basophils Relative: 1 % (ref 0–1)
EOS PCT: 0 % (ref 0–5)
Eosinophils Absolute: 0 10*3/uL (ref 0.0–0.7)
HEMATOCRIT: 39.4 % (ref 36.0–46.0)
Hemoglobin: 12.7 g/dL (ref 12.0–15.0)
LYMPHS ABS: 0.6 10*3/uL — AB (ref 0.7–4.0)
LYMPHS PCT: 14 % (ref 12–46)
MCH: 27.4 pg (ref 26.0–34.0)
MCHC: 32.2 g/dL (ref 30.0–36.0)
MCV: 85.1 fL (ref 78.0–100.0)
MONO ABS: 0.1 10*3/uL (ref 0.1–1.0)
Monocytes Relative: 2 % — ABNORMAL LOW (ref 3–12)
Neutro Abs: 3.8 10*3/uL (ref 1.7–7.7)
Neutrophils Relative %: 83 % — ABNORMAL HIGH (ref 43–77)
Platelets: 251 10*3/uL (ref 150–400)
RBC: 4.63 MIL/uL (ref 3.87–5.11)
RDW: 13.6 % (ref 11.5–15.5)
WBC: 4.6 10*3/uL (ref 4.0–10.5)

## 2014-03-14 MED ORDER — ONDANSETRON HCL 4 MG/2ML IJ SOLN
4.0000 mg | Freq: Once | INTRAMUSCULAR | Status: AC
  Administered 2014-03-15: 4 mg via INTRAVENOUS
  Filled 2014-03-14: qty 2

## 2014-03-14 MED ORDER — SODIUM CHLORIDE 0.9 % IV SOLN
1000.0000 mL | Freq: Once | INTRAVENOUS | Status: AC
  Administered 2014-03-15: 1000 mL via INTRAVENOUS

## 2014-03-14 MED ORDER — SODIUM CHLORIDE 0.9 % IV SOLN: 1000.0000 mL | INTRAVENOUS | Status: DC

## 2014-03-14 MED ORDER — HYDROMORPHONE HCL 1 MG/ML IJ SOLN
1.0000 mg | Freq: Once | INTRAMUSCULAR | Status: AC
Start: 2014-03-14 — End: 2014-03-15
  Administered 2014-03-15: 1 mg via INTRAVENOUS
  Filled 2014-03-14: qty 1

## 2014-03-14 NOTE — ED Notes (Signed)
Pt presents w/ LLQ pain 10/10 cramping.  Pt has constant nausea, emesis x 10 with strands of blood.

## 2014-03-14 NOTE — ED Notes (Signed)
CT tech brought pt PO contrast to drink while waiting.  Pt's family reports pt did not want to drink the contrast because it's cold.

## 2014-03-15 LAB — URINALYSIS, ROUTINE W REFLEX MICROSCOPIC
BILIRUBIN URINE: NEGATIVE
GLUCOSE, UA: NEGATIVE mg/dL
KETONES UR: 40 mg/dL — AB
LEUKOCYTES UA: NEGATIVE
Nitrite: NEGATIVE
PROTEIN: NEGATIVE mg/dL
Specific Gravity, Urine: >1.046 — ABNORMAL HIGH (ref 1.005–1.030)
Urobilinogen, UA: 1 mg/dL (ref 0.0–1.0)
pH: 5.5 (ref 5.0–8.0)

## 2014-03-15 LAB — COMPREHENSIVE METABOLIC PANEL
ALT: 8 U/L (ref 0–35)
AST: 13 U/L (ref 0–37)
Albumin: 3.8 g/dL (ref 3.5–5.2)
Alkaline Phosphatase: 47 U/L (ref 39–117)
Anion gap: 14 (ref 5–15)
BILIRUBIN TOTAL: 0.3 mg/dL (ref 0.3–1.2)
BUN: 4 mg/dL — ABNORMAL LOW (ref 6–23)
CALCIUM: 9.3 mg/dL (ref 8.4–10.5)
CO2: 23 meq/L (ref 19–32)
CREATININE: 0.84 mg/dL (ref 0.50–1.10)
Chloride: 105 mEq/L (ref 96–112)
GFR, EST NON AFRICAN AMERICAN: 78 mL/min — AB (ref >90)
GLUCOSE: 144 mg/dL — AB (ref 70–99)
Potassium: 3.9 mEq/L (ref 3.7–5.3)
SODIUM: 142 meq/L (ref 137–147)
Total Protein: 7.7 g/dL (ref 6.0–8.3)

## 2014-03-15 LAB — URINE MICROSCOPIC-ADD ON

## 2014-03-15 LAB — LIPASE, BLOOD: Lipase: 16 U/L (ref 11–59)

## 2014-03-15 MED ORDER — PROMETHAZINE HCL 25 MG RE SUPP: 25.0000 mg | Freq: Four times a day (QID) | RECTAL | Status: DC | PRN

## 2014-03-15 MED ORDER — IOHEXOL 300 MG/ML  SOLN
100.0000 mL | Freq: Once | INTRAMUSCULAR | Status: AC | PRN
  Administered 2014-03-15: 100 mL via INTRAVENOUS

## 2014-03-15 MED ORDER — ONDANSETRON 8 MG PO TBDP: 8.0000 mg | ORAL_TABLET | Freq: Three times a day (TID) | ORAL | Status: DC | PRN

## 2014-03-15 MED ORDER — IOHEXOL 300 MG/ML  SOLN
50.0000 mL | Freq: Once | INTRAMUSCULAR | Status: AC | PRN
  Administered 2014-03-15: 50 mL via ORAL

## 2014-03-15 NOTE — ED Provider Notes (Signed)
CSN: [id number]     Arrival date & time 03/14/14  2249 History   First MD Initiated Contact with Patient 03/14/14 2314     Chief Complaint  Patient presents with  . Abdominal Pain  . Nausea    HPI Patient presents to the emergency department complaining of left lower quadrant abdominal pain that is crampy in nature over the past 3 days.  She's had nausea and vomiting.  She reports his been stranding of blood within the vomit itself.  No gross blood.  No blood clots.  Denies diarrhea.  She reports a mild left flank pain as well.  The pain does not radiate.  She states she was seen in outside emergency department and a CT scan was performed and did not demonstrate any pathology.  This is greater than 48 hours ago.  Patient continues to have symptoms and have decreased oral intake per the patient and per the patient's family member.  No fevers or chills.  No urinary complaints.  Symptoms are moderate to severe in severity at this time.  Patient feels weak and lightheaded.  History of gastric ulcers as well as gastroesophageal reflux disease.   Past Medical History  Diagnosis Date  . GERD (gastroesophageal reflux disease)   . History of stomach ulcers   . Sciatica    History reviewed. No pertinent past surgical history. History reviewed. No pertinent family history. History  Substance Use Topics  . Smoking status: Never Smoker   . Smokeless tobacco: Not on file  . Alcohol Use: No   OB History   Grav Para Term Preterm Abortions TAB SAB Ect Mult Living                 Review of Systems  All other systems reviewed and are negative.     Allergies  Other; Peanuts; Penicillins; and Strawberry  Home Medications   Prior to Admission medications   Medication Sig Start Date End Date Taking? Authorizing Provider  acetaminophen (TYLENOL) 500 MG tablet Take 500-1,000 mg by mouth every 6 (six) hours as needed (for pain.).   Yes Historical Provider, MD  famotidine (PEPCID) 40 MG tablet  Take 40 mg by mouth 2 (two) times daily.   Yes Historical Provider, MD  promethazine (PHENERGAN) 25 MG tablet Take 25 mg by mouth every 6 (six) hours as needed for nausea or vomiting.   Yes Historical Provider, MD   BP 139/75  Pulse 84  Temp(Src) 99.5 F (37.5 C) (Oral)  Resp 18  SpO2 100%  LMP 03/11/2014 Physical Exam  Nursing note and vitals reviewed. Constitutional: She is oriented to person, place, and time. She appears well-developed and well-nourished. No distress.  HENT:  Head: Normocephalic and atraumatic.  Eyes: EOM are normal.  Neck: Normal range of motion.  Cardiovascular: Normal rate, regular rhythm and normal heart sounds.   Pulmonary/Chest: Effort normal and breath sounds normal.  Abdominal: Soft. She exhibits no distension. There is no tenderness.  Musculoskeletal: Normal range of motion.  Neurological: She is alert and oriented to person, place, and time.  Skin: Skin is warm and dry.  Psychiatric: She has a normal mood and affect. Judgment normal.    ED Course  Procedures (including critical care time) Labs Review Labs Reviewed  CBC WITH DIFFERENTIAL - Abnormal; Notable for the following:    Neutrophils Relative % 83 (*)    Lymphs Abs 0.6 (*)    Monocytes Relative 2 (*)    All other components within normal limits  COMPREHENSIVE METABOLIC PANEL - Abnormal; Notable for the following:    Glucose, Bld 144 (*)    BUN 4 (*)    GFR calc non Af Amer 78 (*)    All other components within normal limits  URINALYSIS, ROUTINE W REFLEX MICROSCOPIC - Abnormal; Notable for the following:    Specific Gravity, Urine >1.046 (*)    Hgb urine dipstick MODERATE (*)    Ketones, ur 40 (*)    All other components within normal limits  URINE MICROSCOPIC-ADD ON - Abnormal; Notable for the following:    Squamous Epithelial / LPF FEW (*)    All other components within normal limits  LIPASE, BLOOD    Imaging Review Ct Abdomen Pelvis W Contrast  03/15/2014   CLINICAL DATA:   Abdominal pain and nausea.  Initial encounter.  EXAM: CT ABDOMEN AND PELVIS WITH CONTRAST  TECHNIQUE: Multidetector CT imaging of the abdomen and pelvis was performed using the standard protocol following bolus administration of intravenous contrast.  CONTRAST:  100mL OMNIPAQUE IOHEXOL 300 MG/ML SOLN, 50mL OMNIPAQUE IOHEXOL 300 MG/ML SOLN  COMPARISON:  03/12/2014.  FINDINGS: BODY WALL: Unremarkable.  LOWER CHEST: Mild dependent atelectasis.  ABDOMEN/PELVIS:  Liver: No focal abnormality.  Biliary: Prominence of the biliary tree caliber which is stable from prior. Noted normal bilirubin.  Pancreas: Unremarkable.  Spleen: Unremarkable.  Adrenals: Unremarkable.  Kidneys and ureters: No hydronephrosis or evidence of stone. Note there is early excretion of urinary contrast which decreases sensitivity for detecting calculus.  Bladder: Unremarkable.  Reproductive: Unremarkable.  Bowel: Borderline thickening of the gastric antral wall, but not definitive. There is no gross ulceration. No evidence for obstruction or inflammation. Normal appendix.  Retroperitoneum: No mass or adenopathy.  Peritoneum: No ascites or pneumoperitoneum.  Vascular: No acute abnormality.  OSSEOUS: No acute abnormalities.  IMPRESSION: No acute intra-abdominal findings.   Electronically Signed   By: Tiburcio PeaJonathan  Watts M.D.   On: 03/15/2014 01:39  I personally reviewed the imaging tests through PACS system I reviewed available ER/hospitalization records through the EMR    EKG Interpretation None      MDM   Final diagnoses:  Abdominal pain, unspecified abdominal location  Nausea and vomiting, vomiting of unspecified type  Dehydration    4:15 AM Pt feels better at this time. Dc home in good condition. Outpatient GI follow up    Lyanne CoKevin M Bion Todorov, MD 03/15/14 [phone]

## 2014-03-15 NOTE — Discharge Instructions (Signed)

## 2014-03-15 NOTE — ED Notes (Signed)
Patient left medications in room. Called patient, left message requesting a return phone call.

## 2014-03-21 ENCOUNTER — Encounter (HOSPITAL_COMMUNITY): Payer: Self-pay | Admitting: Emergency Medicine

## 2014-03-21 ENCOUNTER — Emergency Department (HOSPITAL_COMMUNITY): Payer: Self-pay

## 2014-03-21 ENCOUNTER — Emergency Department (HOSPITAL_COMMUNITY)
Admission: EM | Admit: 2014-03-21 | Discharge: 2014-03-22 | Disposition: A | Discharge status: Home / Self Care | Payer: Self-pay | Attending: Emergency Medicine | Admitting: Emergency Medicine

## 2014-03-21 DIAGNOSIS — Z880 Allergy status to penicillin: Secondary | ICD-10-CM | POA: Insufficient documentation

## 2014-03-21 DIAGNOSIS — R1012 Left upper quadrant pain: Secondary | ICD-10-CM | POA: Insufficient documentation

## 2014-03-21 DIAGNOSIS — R1013 Epigastric pain: Secondary | ICD-10-CM | POA: Insufficient documentation

## 2014-03-21 DIAGNOSIS — R112 Nausea with vomiting, unspecified: Secondary | ICD-10-CM | POA: Insufficient documentation

## 2014-03-21 DIAGNOSIS — R1033 Periumbilical pain: Secondary | ICD-10-CM | POA: Insufficient documentation

## 2014-03-21 DIAGNOSIS — M5430 Sciatica, unspecified side: Secondary | ICD-10-CM | POA: Insufficient documentation

## 2014-03-21 DIAGNOSIS — K219 Gastro-esophageal reflux disease without esophagitis: Secondary | ICD-10-CM | POA: Insufficient documentation

## 2014-03-21 DIAGNOSIS — R109 Unspecified abdominal pain: Secondary | ICD-10-CM

## 2014-03-21 DIAGNOSIS — R1032 Left lower quadrant pain: Secondary | ICD-10-CM | POA: Insufficient documentation

## 2014-03-21 DIAGNOSIS — Z79899 Other long term (current) drug therapy: Secondary | ICD-10-CM | POA: Insufficient documentation

## 2014-03-21 LAB — COMPREHENSIVE METABOLIC PANEL
ALK PHOS: 49 U/L (ref 39–117)
ALT: 59 U/L — ABNORMAL HIGH (ref 0–35)
ANION GAP: 17 — AB (ref 5–15)
AST: 92 U/L — ABNORMAL HIGH (ref 0–37)
Albumin: 4 g/dL (ref 3.5–5.2)
BILIRUBIN TOTAL: 0.4 mg/dL (ref 0.3–1.2)
BUN: 5 mg/dL — AB (ref 6–23)
CHLORIDE: 97 meq/L (ref 96–112)
CO2: 20 meq/L (ref 19–32)
CREATININE: 0.72 mg/dL (ref 0.50–1.10)
Calcium: 9.4 mg/dL (ref 8.4–10.5)
GFR calc Af Amer: >90 mL/min (ref >90)
Glucose, Bld: 138 mg/dL — ABNORMAL HIGH (ref 70–99)
POTASSIUM: 3.9 meq/L (ref 3.7–5.3)
Sodium: 134 mEq/L — ABNORMAL LOW (ref 137–147)
Total Protein: 7.7 g/dL (ref 6.0–8.3)

## 2014-03-21 LAB — CBC WITH DIFFERENTIAL/PLATELET
Basophils Absolute: 0 10*3/uL (ref 0.0–0.1)
Basophils Relative: 0 % (ref 0–1)
Eosinophils Absolute: 0 10*3/uL (ref 0.0–0.7)
Eosinophils Relative: 1 % (ref 0–5)
HEMATOCRIT: 39.8 % (ref 36.0–46.0)
HEMOGLOBIN: 13.2 g/dL (ref 12.0–15.0)
LYMPHS ABS: 0.7 10*3/uL (ref 0.7–4.0)
LYMPHS PCT: 13 % (ref 12–46)
MCH: 27.8 pg (ref 26.0–34.0)
MCHC: 33.2 g/dL (ref 30.0–36.0)
MCV: 84 fL (ref 78.0–100.0)
MONO ABS: 0.4 10*3/uL (ref 0.1–1.0)
MONOS PCT: 7 % (ref 3–12)
NEUTROS ABS: 4.6 10*3/uL (ref 1.7–7.7)
Neutrophils Relative %: 79 % — ABNORMAL HIGH (ref 43–77)
Platelets: 232 10*3/uL (ref 150–400)
RBC: 4.74 MIL/uL (ref 3.87–5.11)
RDW: 13.6 % (ref 11.5–15.5)
WBC: 5.7 10*3/uL (ref 4.0–10.5)

## 2014-03-21 LAB — URINALYSIS, ROUTINE W REFLEX MICROSCOPIC
Bilirubin Urine: NEGATIVE
GLUCOSE, UA: NEGATIVE mg/dL
HGB URINE DIPSTICK: NEGATIVE
LEUKOCYTES UA: NEGATIVE
Nitrite: NEGATIVE
Protein, ur: NEGATIVE mg/dL
Specific Gravity, Urine: 1.016 (ref 1.005–1.030)
Urobilinogen, UA: 1 mg/dL (ref 0.0–1.0)
pH: 7.5 (ref 5.0–8.0)

## 2014-03-21 LAB — I-STAT CG4 LACTIC ACID, ED: Lactic Acid, Venous: 1.89 mmol/L (ref 0.5–2.2)

## 2014-03-21 LAB — LIPASE, BLOOD: Lipase: 22 U/L (ref 11–59)

## 2014-03-21 MED ORDER — STERILE WATER FOR INJECTION IJ SOLN
10.0000 mL | Freq: Once | INTRAMUSCULAR | Status: AC
  Administered 2014-03-21: 10 mL via INTRAMUSCULAR

## 2014-03-21 MED ORDER — ONDANSETRON HCL 4 MG/2ML IJ SOLN
4.0000 mg | Freq: Once | INTRAMUSCULAR | Status: AC
  Administered 2014-03-21: 4 mg via INTRAVENOUS
  Filled 2014-03-21: qty 2

## 2014-03-21 MED ORDER — HYDROCODONE-ACETAMINOPHEN 5-325 MG PO TABS
1.0000 | ORAL_TABLET | Freq: Once | ORAL | Status: AC
Start: 2014-03-21 — End: 2014-03-22
  Administered 2014-03-22: 1 via ORAL
  Filled 2014-03-21: qty 1

## 2014-03-21 MED ORDER — SODIUM CHLORIDE 0.9 % IV BOLUS (SEPSIS)
1000.0000 mL | Freq: Once | INTRAVENOUS | Status: AC
  Administered 2014-03-21: 1000 mL via INTRAVENOUS

## 2014-03-21 MED ORDER — HYDROMORPHONE HCL 1 MG/ML IJ SOLN
1.0000 mg | Freq: Once | INTRAMUSCULAR | Status: AC
  Administered 2014-03-21: 1 mg via INTRAVENOUS
  Filled 2014-03-21: qty 1

## 2014-03-21 MED ORDER — PANTOPRAZOLE SODIUM 40 MG IV SOLR
40.0000 mg | Freq: Once | INTRAVENOUS | Status: AC
  Administered 2014-03-21: 40 mg via INTRAVENOUS
  Filled 2014-03-21: qty 40

## 2014-03-21 MED ORDER — STERILE WATER FOR INJECTION IJ SOLN
INTRAMUSCULAR | Status: AC
  Administered 2014-03-21: 10 mL via INTRAMUSCULAR
  Filled 2014-03-21: qty 10

## 2014-03-21 MED ORDER — ONDANSETRON 8 MG PO TBDP
8.0000 mg | ORAL_TABLET | Freq: Once | ORAL | Status: DC
  Filled 2014-03-21: qty 1

## 2014-03-21 NOTE — ED Notes (Signed)
[facility] stating she was dx with ulcer, pt states she did not get per prescriptions filled d/t wait. Pt states she chewed several Advil PM today, has not taken antiacids. Pt tearful in triage.

## 2014-03-21 NOTE — ED Provider Notes (Signed)
CSN: [id number]     Arrival date & time 03/21/14  1954 History   First MD Initiated Contact with Patient 03/21/14 2025     Chief Complaint  Patient presents with  . Abdominal Pain     (Consider location/radiation/quality/duration/timing/severity/associated sxs/prior Treatment) HPI 53 year old female presents with continued left-sided abdominal pain. She states last week she had abdominal pain and was admitted [facility] and had a CT scan at that time. She was then discharged the next day and [facility] and [facility] scan and had her pain controlled. Earlier this week her pain got worse and she went to [facility] was admitted and had an EGD that showed an ulcer. She was okay as long as she had IV pain medicines. She was discharged yesterday was unable to fill her medicines as they're too expensive at one pharmacy and she went to lay to another. She was at [facility] try to get her prescriptions today but started having too much nausea and vomiting so she came to the ER for pain and nausea control. She states this pain feels worse than the pain she had when she was admitted a couple days ago. No constipation or diarrhea. No blood in her stools or vomit.  Past Medical History  Diagnosis Date  . GERD (gastroesophageal reflux disease)   . History of stomach ulcers   . Sciatica    History reviewed. No pertinent past surgical history. No family history on file. History  Substance Use Topics  . Smoking status: Never Smoker   . Smokeless tobacco: Not on file  . Alcohol Use: No   OB History   Grav Para Term Preterm Abortions TAB SAB Ect Mult Living                 Review of Systems  Constitutional: Negative for fever.  Gastrointestinal: Positive for nausea, vomiting and abdominal pain. Negative for diarrhea, constipation and blood in stool.  All other systems reviewed and are negative.     Allergies  Other; Almond oil; Peanuts; Penicillins; and Strawberry  Home  Medications   Prior to Admission medications   Medication Sig Start Date End Date Taking? Authorizing Provider  acetaminophen (TYLENOL) 500 MG tablet Take 500-1,000 mg by mouth every 6 (six) hours as needed (for pain.).    Historical Provider, MD  famotidine (PEPCID) 40 MG tablet Take 40 mg by mouth 2 (two) times daily.    Historical Provider, MD  ondansetron (ZOFRAN ODT) 8 MG disintegrating tablet Take 1 tablet (8 mg total) by mouth every 8 (eight) hours as needed for nausea or vomiting. 03/15/14   Lyanne CoKevin M Campos, MD  promethazine (PHENERGAN) 25 MG suppository Place 1 suppository (25 mg total) rectally every 6 (six) hours as needed for nausea or vomiting. 03/15/14   Lyanne CoKevin M Campos, MD  promethazine (PHENERGAN) 25 MG tablet Take 25 mg by mouth every 6 (six) hours as needed for nausea or vomiting.    Historical Provider, MD   BP 161/82  Pulse 85  Temp(Src) 98.5 F (36.9 C) (Oral)  Resp 20  Ht 5\' 3"  (1.6 m)  Wt 180 lb (81.647 kg)  BMI 31.89 kg/m2  SpO2 100%  LMP 03/11/2014 Physical Exam  Nursing note and vitals reviewed. Constitutional: She is oriented to person, place, and time. She appears well-developed and well-nourished.  Moaning in bed, crying  HENT:  Head: Normocephalic and atraumatic.  Right Ear: External ear normal.  Left Ear: External ear normal.  Nose: Nose normal.  Eyes: Right eye exhibits no discharge. Left eye exhibits no discharge.  Cardiovascular: Normal rate, regular rhythm and normal heart sounds.   Pulmonary/Chest: Effort normal and breath sounds normal.  Abdominal: Soft. She exhibits no distension. There is tenderness in the epigastric area, periumbilical area, left upper quadrant and left lower quadrant. There is no rigidity, no rebound and no guarding.  Neurological: She is alert and oriented to person, place, and time.  Skin: Skin is warm and dry.    ED Course  Procedures (including critical care time) Labs Review Labs Reviewed  CBC WITH DIFFERENTIAL -  Abnormal; Notable for the following:    Neutrophils Relative % 79 (*)    All other components within normal limits  COMPREHENSIVE METABOLIC PANEL - Abnormal; Notable for the following:    Sodium 134 (*)    Glucose, Bld 138 (*)    BUN 5 (*)    AST 92 (*)    ALT 59 (*)    Anion gap 17 (*)    All other components within normal limits  URINALYSIS, ROUTINE W REFLEX MICROSCOPIC - Abnormal; Notable for the following:    APPearance CLOUDY (*)    Ketones, ur >80 (*)    All other components within normal limits  LIPASE, BLOOD  I-STAT CG4 LACTIC ACID, ED    Imaging Review Dg Abd Acute W/chest  03/21/2014   CLINICAL DATA:  53 year old female recently diagnosed with an ulcer. Abdominal pain.  EXAM: ACUTE ABDOMEN SERIES (ABDOMEN 2 VIEW & CHEST 1 VIEW)  COMPARISON:  CT of the abdomen and pelvis 03/15/2014.  FINDINGS: Lung volumes are normal. No consolidative airspace disease. No pleural effusions. No pneumothorax. No pulmonary nodule or mass noted. Pulmonary vasculature and the cardiomediastinal silhouette are within normal limits. Atherosclerosis in the thoracic aorta.  Gas and stool are seen scattered throughout the colon extending to the level of the distal rectum. No pathologic distension of small bowel is noted. A few nondilated loops of gas-filled small bowel are noted in the central abdomen. No gross evidence of pneumoperitoneum.  IMPRESSION: 1. Nonspecific, nonobstructive bowel gas pattern. 2. No pneumoperitoneum. 3. No radiographic evidence of acute cardiopulmonary disease.   Electronically Signed   By: Trudie Reedaniel  Entrikin M.D.   On: 03/21/2014 21:56     EKG Interpretation None      MDM   Final diagnoses:  Abdominal pain    Patient with continued and recurrent upper abd pain she attributes to ulcers found on EGD a few days ago. Has not picked up her meds from pharmacy yet, including her antacids and narcotics. Pain controlled with IV narcotics here, given IV fluids and no vomiting while in  ED. Patient is going to pick up her meds in AM, will give oral dose of narcotics here and recommend picking up meds ASAP and keeping close f/u with PCP and GI. Outside hospital records have been reviewed, and with 2 neg CT scans in past 1-2 weeks I do not feel she needs repeat CT imaging at this time.    Audree CamelScott T Kathryn Linarez, MD 03/22/14 [phone]

## 2014-03-21 NOTE — ED Notes (Signed)
Pt. Was upset that this Nurse told  the pt.'s daughter of charging her phone on the other counter instead of the pt.'s table where it can be seen so it wont drop  . Pt. Told this Nurse to " SHUT UP and just do your job and that's not necessary!'

## 2014-03-22 ENCOUNTER — Encounter (HOSPITAL_COMMUNITY): Payer: Self-pay | Admitting: Emergency Medicine

## 2014-03-22 ENCOUNTER — Emergency Department (HOSPITAL_COMMUNITY)
Admission: EM | Admit: 2014-03-22 | Discharge: 2014-03-23 | Disposition: A | Discharge status: Home / Self Care | Payer: No Typology Code available for payment source | Attending: Emergency Medicine | Admitting: Emergency Medicine

## 2014-03-22 DIAGNOSIS — R1084 Generalized abdominal pain: Secondary | ICD-10-CM

## 2014-03-22 DIAGNOSIS — R112 Nausea with vomiting, unspecified: Secondary | ICD-10-CM | POA: Insufficient documentation

## 2014-03-22 DIAGNOSIS — Z880 Allergy status to penicillin: Secondary | ICD-10-CM | POA: Insufficient documentation

## 2014-03-22 DIAGNOSIS — Z8739 Personal history of other diseases of the musculoskeletal system and connective tissue: Secondary | ICD-10-CM | POA: Insufficient documentation

## 2014-03-22 DIAGNOSIS — Z79899 Other long term (current) drug therapy: Secondary | ICD-10-CM | POA: Insufficient documentation

## 2014-03-22 DIAGNOSIS — K219 Gastro-esophageal reflux disease without esophagitis: Secondary | ICD-10-CM | POA: Insufficient documentation

## 2014-03-22 MED ORDER — SODIUM CHLORIDE 0.9 % IV BOLUS (SEPSIS)
1000.0000 mL | Freq: Once | INTRAVENOUS | Status: AC
  Administered 2014-03-22: 1000 mL via INTRAVENOUS

## 2014-03-22 MED ORDER — HYDROMORPHONE HCL 1 MG/ML IJ SOLN
1.0000 mg | Freq: Once | INTRAMUSCULAR | Status: AC
  Administered 2014-03-22: 1 mg via INTRAVENOUS
  Filled 2014-03-22: qty 1

## 2014-03-22 MED ORDER — ONDANSETRON 4 MG PO TBDP
8.0000 mg | ORAL_TABLET | Freq: Once | ORAL | Status: AC
  Administered 2014-03-22: 8 mg via ORAL
  Filled 2014-03-22: qty 2

## 2014-03-22 MED ORDER — ONDANSETRON HCL 4 MG/2ML IJ SOLN
4.0000 mg | Freq: Once | INTRAMUSCULAR | Status: AC
Start: 2014-03-22 — End: 2014-03-22
  Administered 2014-03-22: 4 mg via INTRAVENOUS
  Filled 2014-03-22: qty 2

## 2014-03-22 MED ORDER — MORPHINE SULFATE 4 MG/ML IJ SOLN: 4.0000 mg | Freq: Once | INTRAMUSCULAR | Status: DC

## 2014-03-22 NOTE — Discharge Instructions (Signed)

## 2014-03-22 NOTE — ED Provider Notes (Signed)
CSN: [id number]     Arrival date & time 03/22/14  2012 History   First MD Initiated Contact with Patient 03/22/14 2126     Chief Complaint  Patient presents with  . Emesis  . Abdominal Pain     (Consider location/radiation/quality/duration/timing/severity/associated sxs/prior Treatment) HPI Heather Cook is a 53 y.o. female with PMH of GERD and ulcer presenting with severe abdominal pain for four days with intermittent nausea and vomiting. Patient endorses multiple episodes of emesis today with small amount of red in first episode but none in subsequent ones. Patient states that she feels fine when she has IV pain medications but as soon as she is sent home the pain and the nausea returns. Patient was seen last night at [facility] and given scripts for pepcid, pain medications and zofran. Patient filled the scripts but states she was not given the pain medication by the pharmacist even though the pharmacist had spoke her her about the medications. Patient with scheduled GI appointment NOV 2. Patient with two negative CT scans in the past 2 weeks. Patient with EGD earlier this week which revealed an ulcer. Patient denies fevers, chills. Last BM today and normal without blood or melena. Patient denies vaginal or urinary complaints.   Past Medical History  Diagnosis Date  . GERD (gastroesophageal reflux disease)   . History of stomach ulcers   . Sciatica    History reviewed. No pertinent past surgical history. No family history on file. History  Substance Use Topics  . Smoking status: Never Smoker   . Smokeless tobacco: Not on file  . Alcohol Use: No   OB History   Grav Para Term Preterm Abortions TAB SAB Ect Mult Living                 Review of Systems  Constitutional: Negative for fever and chills.  HENT: Negative for congestion and rhinorrhea.   Eyes: Negative for visual disturbance.  Respiratory: Negative for cough and shortness of breath.   Cardiovascular: Negative for chest pain and  palpitations.  Gastrointestinal: Positive for nausea, vomiting and abdominal pain. Negative for diarrhea.  Genitourinary: Negative for dysuria and hematuria.  Musculoskeletal: Negative for back pain and gait problem.  Skin: Negative for rash.  Neurological: Negative for weakness and headaches.      Allergies  Other; Almond oil; Peanuts; Penicillins; and Strawberry  Home Medications   Prior to Admission medications   Medication Sig Start Date End Date Taking? Authorizing Provider  acetaminophen (TYLENOL) 500 MG tablet Take 500-1,000 mg by mouth every 6 (six) hours as needed (for pain.).   Yes Historical Provider, MD  famotidine (PEPCID) 40 MG tablet Take 40 mg by mouth 2 (two) times daily.   Yes Historical Provider, MD  Ibuprofen-Diphenhydramine Cit (ADVIL PM PO) Take 1 tablet by mouth daily as needed (pain).   Yes Historical Provider, MD  ondansetron (ZOFRAN ODT) 8 MG disintegrating tablet Take 1 tablet (8 mg total) by mouth every 8 (eight) hours as needed for nausea or vomiting. 03/15/14  Yes Lyanne Co, MD  promethazine (PHENERGAN) 25 MG tablet Take 25 mg by mouth every 6 (six) hours as needed for nausea or vomiting.   Yes Historical Provider, MD  HYDROcodone-acetaminophen (NORCO/VICODIN) 5-325 MG per tablet Take 1 tablet by mouth every 4 (four) hours as needed for moderate pain or severe pain. 03/23/14   Louann Sjogren, PA-C  promethazine (PHENERGAN) 25 MG suppository Place 1 suppository (25 mg total) rectally every 6 (six) hours  as needed for nausea or vomiting. 03/23/14   Louann SjogrenVictoria L Mikhayla Phillis, PA-C   BP 123/70  Pulse 78  Temp(Src) 99.5 F (37.5 C) (Oral)  Resp 14  Ht 5\' 3"  (1.6 m)  Wt 180 lb (81.647 kg)  BMI 31.89 kg/m2  SpO2 98%  LMP 03/11/2014 Physical Exam  Nursing note and vitals reviewed. Constitutional: She appears well-developed and well-nourished. No distress.  HENT:  Head: Normocephalic and atraumatic.  Eyes: Conjunctivae and EOM are normal. Right eye  exhibits no discharge. Left eye exhibits no discharge.  Cardiovascular: Normal rate, regular rhythm and normal heart sounds.   Pulmonary/Chest: Effort normal and breath sounds normal. No respiratory distress. She has no wheezes.  Abdominal: Soft. She exhibits no distension.  Diffuse abdominal tenderness worse in LLQ and LUQ. No rebound, guarding or rigidity. +BS in all four quadrants. No CVA tenderness.  Neurological: She is alert. She exhibits normal muscle tone. Coordination normal.  Skin: Skin is warm and dry. She is not diaphoretic.    ED Course  Procedures (including critical care time) Labs Review Labs Reviewed - No data to display  Imaging Review Dg Abd Acute W/chest  03/21/2014   CLINICAL DATA:  53 year old female recently diagnosed with an ulcer. Abdominal pain.  EXAM: ACUTE ABDOMEN SERIES (ABDOMEN 2 VIEW & CHEST 1 VIEW)  COMPARISON:  CT of the abdomen and pelvis 03/15/2014.  FINDINGS: Lung volumes are normal. No consolidative airspace disease. No pleural effusions. No pneumothorax. No pulmonary nodule or mass noted. Pulmonary vasculature and the cardiomediastinal silhouette are within normal limits. Atherosclerosis in the thoracic aorta.  Gas and stool are seen scattered throughout the colon extending to the level of the distal rectum. No pathologic distension of small bowel is noted. A few nondilated loops of gas-filled small bowel are noted in the central abdomen. No gross evidence of pneumoperitoneum.  IMPRESSION: 1. Nonspecific, nonobstructive bowel gas pattern. 2. No pneumoperitoneum. 3. No radiographic evidence of acute cardiopulmonary disease.   Electronically Signed   By: Trudie Reedaniel  Entrikin M.D.   On: 03/21/2014 21:56     EKG Interpretation   Date/Time:  Saturday March 22 2014 20:48:35 EDT Ventricular Rate:  89 PR Interval:  128 QRS Duration: 72 QT Interval:  422 QTC Calculation: 513 R Axis:   76 Text Interpretation:  Normal sinus rhythm Septal infarct , age   undetermined Prolonged QT T wave inversion more evident in Anterior leads  T wave inversion no longer evident in Lateral leads Since last tracing 17 Feb 2007 Confirmed by [facility]  MD-I, IVA ([id number]) on 03/22/2014 9:43:38 PM       MDM   Final diagnoses:  Diffuse abdominal pain  Nausea and vomiting, vomiting of unspecified type   Patient presenting with diffuse abdominal pain for the past 2 weeks with acute worsening in the last 4 days with associated nausea and vomiting. Patient pain adequately treated in ED. VSS. Diffuse abdominal tenderness without signs of peritonitis. Labs unremarkable yesterday and no indication to repeat due to the pain being identical per patient without changes in presentation. No imaging indicated at this time due to the two unremarkable CT in the last month. Patient tolerating PO fluids in ED. Patient to take zofran every 6 hours for the first 24 hours and then at the first sign of nausea. Slow progression of fluid and then brat diet. Patient given script for phenergan suppositories as needed. Pain medication script provided since it is unclear what happened to her pain medication script from yesterday  and the [facility] reporting system does not show any scripts in the past 6 months. Driving and sedation precautions provided. Patient with GI appointment November 2 and encouraged to call for an earlier appointment.   Discussed return precautions with patient. Discussed all results and patient verbalizes understanding and agrees with plan.  Case has been discussed with Dr. Lynelle DoctorKnapp who agrees with the above plan and to discharge.      Louann SjogrenVictoria L Novah Goza, PA-C 03/23/14 0115  Louann SjogrenVictoria L Yeira Gulden, PA-C 03/23/14 [phone]

## 2014-03-22 NOTE — ED Notes (Addendum)
[facility] dx'd with ulcer. C/o  abd pain and nv witgh "blood", currently spitting clear into emesis bag, no blood noted. Prescribed medicines, here for same sx, "medicines aren't helping, not able to keep meds down". Rates pain 10/10, pt tearful. "meds obtained at 1400 today, has had only one dose of nv med, multiple doses of muscle relaxant".

## 2014-03-22 NOTE — ED Notes (Signed)
Two nurses have attempted IVs unsuccessfully, paged IV team.

## 2014-03-22 NOTE — ED Notes (Signed)
IV team at bedside 

## 2014-03-23 MED ORDER — PROMETHAZINE HCL 25 MG RE SUPP: 25.0000 mg | Freq: Four times a day (QID) | RECTAL | Status: DC | PRN

## 2014-03-23 MED ORDER — HYDROCODONE-ACETAMINOPHEN 5-325 MG PO TABS: 1.0000 | ORAL_TABLET | ORAL | Status: DC | PRN

## 2014-03-23 MED ORDER — HYDROCODONE-ACETAMINOPHEN 5-325 MG PO TABS
2.0000 | ORAL_TABLET | Freq: Once | ORAL | Status: AC
  Administered 2014-03-23: 2 via ORAL
  Filled 2014-03-23: qty 2

## 2014-03-23 NOTE — ED Provider Notes (Signed)
Medical screening examination/treatment/procedure(s) were performed by non-physician practitioner and as supervising physician I was immediately available for consultation/collaboration.   EKG Interpretation   Date/Time:  Saturday March 22 2014 20:48:35 EDT Ventricular Rate:  89 PR Interval:  128 QRS Duration: 72 QT Interval:  422 QTC Calculation: 513 R Axis:   76 Text Interpretation:  Normal sinus rhythm Septal infarct , age  undetermined Prolonged QT T wave inversion more evident in Anterior leads  T wave inversion no longer evident in Lateral leads Since last tracing 17 Feb 2007 Confirmed by [facility]  MD-I, Naleigha Raimondi ([id number]) on 03/22/2014 9:43:38 PM      Devoria AlbeIva Vega Stare, MD, Franz DellFACEP   Marcelene Weidemann L Alvaro Aungst, MD 03/23/14 1459

## 2014-03-23 NOTE — Discharge Instructions (Signed)
Return to the emergency room with worsening of symptoms, new symptoms or with symptoms that are concerning, especially fevers, severe abdominal pain, vomiting and unable to keep fluids down. Call your gastroenterologist to set up an earlier appointment. Small sips. Take your zofran every 4-6 hours for the first 24 hours and then at the first sign of nausea.  Take small sips of fluids and then advance to brat diet. BRAT diet: bananas, rice, applesauce, toast. Advance diet as tolerated. norco only for severe pain. Do not operate machinery, drive or drink alcohol while taking narcotics or muscle relaxers. If you start to vomit. Take phenergan suppository for nausea and vomiting.    Abdominal Pain, Women Abdominal (stomach, pelvic, or belly) pain can be caused by many things. It is important to tell your doctor:  The location of the pain.  Does it come and go or is it present all the time?  Are there things that start the pain (eating certain foods, exercise)?  Are there other symptoms associated with the pain (fever, nausea, vomiting, diarrhea)? All of this is helpful to know when trying to find the cause of the pain. CAUSES   Stomach: virus or bacteria infection, or ulcer.  Intestine: appendicitis (inflamed appendix), regional ileitis (Crohn's disease), ulcerative colitis (inflamed colon), irritable bowel syndrome, diverticulitis (inflamed diverticulum of the colon), or cancer of the stomach or intestine.  Gallbladder disease or stones in the gallbladder.  Kidney disease, kidney stones, or infection.  Pancreas infection or cancer.  Fibromyalgia (pain disorder).  Diseases of the female organs:  Uterus: fibroid (non-cancerous) tumors or infection.  Fallopian tubes: infection or tubal pregnancy.  Ovary: cysts or tumors.  Pelvic adhesions (scar tissue).  Endometriosis (uterus lining tissue growing in the pelvis and on the pelvic organs).  Pelvic congestion syndrome (female  organs filling up with blood just before the menstrual period).  Pain with the menstrual period.  Pain with ovulation (producing an egg).  Pain with an IUD (intrauterine device, birth control) in the uterus.  Cancer of the female organs.  Functional pain (pain not caused by a disease, may improve without treatment).  Psychological pain.  Depression. DIAGNOSIS  Your doctor will decide the seriousness of your pain by doing an examination.  Blood tests.  X-rays.  Ultrasound.  CT scan (computed tomography, special type of X-ray).  MRI (magnetic resonance imaging).  Cultures, for infection.  Barium enema (dye inserted in the large intestine, to better view it with X-rays).  Colonoscopy (looking in intestine with a lighted tube).  Laparoscopy (minor surgery, looking in abdomen with a lighted tube).  Major abdominal exploratory surgery (looking in abdomen with a large incision). TREATMENT  The treatment will depend on the cause of the pain.   Many cases can be observed and treated at home.  Over-the-counter medicines recommended by your caregiver.  Prescription medicine.  Antibiotics, for infection.  Birth control pills, for painful periods or for ovulation pain.  Hormone treatment, for endometriosis.  Nerve blocking injections.  Physical therapy.  Antidepressants.  Counseling with a psychologist or psychiatrist.  Minor or major surgery. HOME CARE INSTRUCTIONS   Do not take laxatives, unless directed by your caregiver.  Take over-the-counter pain medicine only if ordered by your caregiver. Do not take aspirin because it can cause an upset stomach or bleeding.  Try a clear liquid diet (broth or water) as ordered by your caregiver. Slowly move to a bland diet, as tolerated, if the pain is related to the stomach or intestine.  Have a thermometer and take your temperature several times a day, and record it.  Bed rest and sleep, if it helps the  pain.  Avoid sexual intercourse, if it causes pain.  Avoid stressful situations.  Keep your follow-up appointments and tests, as your caregiver orders.  If the pain does not go away with medicine or surgery, you may try:  Acupuncture.  Relaxation exercises (yoga, meditation).  Group therapy.  Counseling. SEEK MEDICAL CARE IF:   You notice certain foods cause stomach pain.  Your home care treatment is not helping your pain.  You need stronger pain medicine.  You want your IUD removed.  You feel faint or lightheaded.  You develop nausea and vomiting.  You develop a rash.  You are having side effects or an allergy to your medicine. SEEK IMMEDIATE MEDICAL CARE IF:   Your pain does not go away or gets worse.  You have a fever.  Your pain is felt only in portions of the abdomen. The right side could possibly be appendicitis. The left lower portion of the abdomen could be colitis or diverticulitis.  You are passing blood in your stools (bright red or black tarry stools, with or without vomiting).  You have blood in your urine.  You develop chills, with or without a fever.  You pass out. MAKE SURE YOU:   Understand these instructions.  Will watch your condition.  Will get help right away if you are not doing well or get worse. Document Released: 03/13/2007 Document Revised: 09/30/2013 Document Reviewed: 04/02/2009 [facility] Patient Information 2015 [place], [place]. This information is not intended to replace advice given to you by your health care provider. Make sure you discuss any questions you have with your health care provider.

## 2014-08-12 ENCOUNTER — Emergency Department (HOSPITAL_COMMUNITY): Payer: Self-pay

## 2014-08-12 ENCOUNTER — Encounter (HOSPITAL_COMMUNITY): Payer: Self-pay | Admitting: Emergency Medicine

## 2014-08-12 ENCOUNTER — Emergency Department (HOSPITAL_COMMUNITY)
Admission: EM | Admit: 2014-08-12 | Discharge: 2014-08-12 | Disposition: A | Discharge status: Home / Self Care | Payer: Self-pay | Attending: Emergency Medicine | Admitting: Emergency Medicine

## 2014-08-12 DIAGNOSIS — N63 Unspecified lump in unspecified breast: Secondary | ICD-10-CM

## 2014-08-12 DIAGNOSIS — Z8719 Personal history of other diseases of the digestive system: Secondary | ICD-10-CM | POA: Insufficient documentation

## 2014-08-12 DIAGNOSIS — Z880 Allergy status to penicillin: Secondary | ICD-10-CM | POA: Insufficient documentation

## 2014-08-12 DIAGNOSIS — Z8739 Personal history of other diseases of the musculoskeletal system and connective tissue: Secondary | ICD-10-CM | POA: Insufficient documentation

## 2014-08-12 DIAGNOSIS — R0602 Shortness of breath: Secondary | ICD-10-CM | POA: Insufficient documentation

## 2014-08-12 LAB — BASIC METABOLIC PANEL
Anion gap: 12 (ref 5–15)
BUN: 11 mg/dL (ref 6–23)
CALCIUM: 9.2 mg/dL (ref 8.4–10.5)
CO2: 21 mmol/L (ref 19–32)
CREATININE: 0.85 mg/dL (ref 0.50–1.10)
Chloride: 102 mmol/L (ref 96–112)
GFR calc non Af Amer: 76 mL/min — ABNORMAL LOW (ref >90)
GFR, EST AFRICAN AMERICAN: 88 mL/min — AB (ref >90)
Glucose, Bld: 94 mg/dL (ref 70–99)
Potassium: 3.9 mmol/L (ref 3.5–5.1)
Sodium: 135 mmol/L (ref 135–145)

## 2014-08-12 LAB — I-STAT TROPONIN, ED: Troponin i, poc: 0.01 ng/mL (ref 0.00–0.08)

## 2014-08-12 LAB — CBC
HCT: 39.9 % (ref 36.0–46.0)
HEMOGLOBIN: 12.8 g/dL (ref 12.0–15.0)
MCH: 27.5 pg (ref 26.0–34.0)
MCHC: 32.1 g/dL (ref 30.0–36.0)
MCV: 85.6 fL (ref 78.0–100.0)
Platelets: 201 10*3/uL (ref 150–400)
RBC: 4.66 MIL/uL (ref 3.87–5.11)
RDW: 13.7 % (ref 11.5–15.5)
WBC: 8.3 10*3/uL (ref 4.0–10.5)

## 2014-08-12 LAB — D-DIMER, QUANTITATIVE (NOT AT ARMC): D-Dimer, Quant: 0.58 ug/mL-FEU — ABNORMAL HIGH (ref 0.00–0.48)

## 2014-08-12 MED ORDER — AZITHROMYCIN 250 MG PO TABS: 250.0000 mg | ORAL_TABLET | Freq: Every day | ORAL | Status: DC

## 2014-08-12 MED ORDER — ACETAMINOPHEN 325 MG PO TABS
650.0000 mg | ORAL_TABLET | Freq: Four times a day (QID) | ORAL | Status: DC | PRN
  Administered 2014-08-12: 650 mg via ORAL
  Filled 2014-08-12: qty 2

## 2014-08-12 MED ORDER — IOHEXOL 350 MG/ML SOLN
100.0000 mL | Freq: Once | INTRAVENOUS | Status: AC | PRN
  Administered 2014-08-12: 100 mL via INTRAVENOUS

## 2014-08-12 NOTE — ED Notes (Signed)
Patient is in x ray I will collect her labs when patient return.

## 2014-08-12 NOTE — Discharge Instructions (Signed)
Your exam shows no evidence of blood clot or infection.  Your CT scan shows a breast nodule.    It is recommended that you have an ultrasound and mammogram of your left breast in the next month.  Please follow-up with your Primary Care Provider, or see the attached resource guide.  Shortness of Breath Shortness of breath means you have trouble breathing. It could also mean that you have a medical problem. You should get immediate medical care for shortness of breath. CAUSES   Not enough oxygen in the air such as with high altitudes or a smoke-filled room.  Certain lung diseases, infections, or problems.  Heart disease or conditions, such as angina or heart failure.  Low red blood cells (anemia).  Poor physical fitness, which can cause shortness of breath when you exercise.  Chest or back injuries or stiffness.  Being overweight.  Smoking.  Anxiety, which can make you feel like you are not getting enough air. DIAGNOSIS  Serious medical problems can often be found during your physical exam. Tests may also be done to determine why you are having shortness of breath. Tests may include:  Chest X-rays.  Lung function tests.  Blood tests.  An electrocardiogram (ECG).  An ambulatory electrocardiogram. An ambulatory ECG records your heartbeat patterns over a 24-hour period.  Exercise testing.  A transthoracic echocardiogram (TTE). During echocardiography, sound waves are used to evaluate how blood flows through your heart.  A transesophageal echocardiogram (TEE).  Imaging scans. Your health care provider may not be able to find a cause for your shortness of breath after your exam. In this case, it is important to have a follow-up exam with your health care provider as directed.  TREATMENT  Treatment for shortness of breath depends on the cause of your symptoms and can vary greatly. HOME CARE INSTRUCTIONS   Do not smoke. Smoking is a common cause of shortness of breath. If you  smoke, ask for help to quit.  Avoid being around chemicals or things that may bother your breathing, such as paint fumes and dust.  Rest as needed. Slowly resume your usual activities.  If medicines were prescribed, take them as directed for the full length of time directed. This includes oxygen and any inhaled medicines.  Keep all follow-up appointments as directed by your health care provider. SEEK MEDICAL CARE IF:   Your condition does not improve in the time expected.  You have a hard time doing your normal activities even with rest.  You have any new symptoms. SEEK IMMEDIATE MEDICAL CARE IF:   Your shortness of breath gets worse.  You feel light-headed, faint, or develop a cough not controlled with medicines.  You start coughing up blood.  You have pain with breathing.  You have chest pain or pain in your arms, shoulders, or abdomen.  You have a fever.  You are unable to walk up stairs or exercise the way you normally do. MAKE SURE YOU:  Understand these instructions.  Will watch your condition.  Will get help right away if you are not doing well or get worse. Document Released: 02/08/2001 Document Revised: 05/21/2013 Document Reviewed: 08/01/2011 [facility] Patient Information 2015 [place], [place]. This information is not intended to replace advice given to you by your health care provider. Make sure you discuss any questions you have with your health care provider.

## 2014-08-12 NOTE — ED Notes (Signed)
Pt reports sob since 0100 yesterday along with lightheadness and non-productive cough. Pt speaking in full sentences without difficulty. Also reports intermittent L abd pain since October. No n/v/d.

## 2014-08-12 NOTE — ED Provider Notes (Signed)
CSN: [id number]     Arrival date & time 08/12/14  1355 History   First MD Initiated Contact with Patient 08/12/14 1552     Chief Complaint  Patient presents with  . Shortness of Breath     (Consider location/radiation/quality/duration/timing/severity/associated sxs/prior Treatment) HPI Comments: Patient presents to the ED with a chief complaint of SOB.  Patient states that the symptoms started yesterday.  She reports associated lightheadedness and nonproductive cough.  She states that this feel similar to when she had pneumonia.  She denies any history of ACS or PE/DVT.  She states that she has been under a lot of stress in dealing with the death of a family member this weekend.  She denies any chest pain.  She reports that her symptoms of SOB worsened when she ran up the stairs.    The history is provided by the patient. No language interpreter was used.    Past Medical History  Diagnosis Date  . GERD (gastroesophageal reflux disease)   . History of stomach ulcers   . Sciatica    History reviewed. No pertinent past surgical history. History reviewed. No pertinent family history. History  Substance Use Topics  . Smoking status: Never Smoker   . Smokeless tobacco: Not on file  . Alcohol Use: No   OB History    No data available     Review of Systems  Constitutional: Negative for fever and chills.  Respiratory: Positive for cough and shortness of breath.   Cardiovascular: Negative for chest pain.  Gastrointestinal: Negative for nausea, vomiting, diarrhea and constipation.  Genitourinary: Negative for dysuria.  All other systems reviewed and are negative.     Allergies  Other; Almond oil; Peanuts; Penicillins; and Strawberry  Home Medications   Prior to Admission medications   Medication Sig Start Date End Date Taking? Authorizing Provider  HYDROcodone-acetaminophen (NORCO/VICODIN) 5-325 MG per tablet Take 1 tablet by mouth every 4 (four) hours as needed for moderate  pain or severe pain. Patient not taking: Reported on 08/12/2014 03/23/14   Oswaldo Conroy, PA-C  ondansetron (ZOFRAN ODT) 8 MG disintegrating tablet Take 1 tablet (8 mg total) by mouth every 8 (eight) hours as needed for nausea or vomiting. Patient not taking: Reported on 08/12/2014 03/15/14   Azalia Bilis, MD  promethazine (PHENERGAN) 25 MG suppository Place 1 suppository (25 mg total) rectally every 6 (six) hours as needed for nausea or vomiting. Patient not taking: Reported on 08/12/2014 03/23/14   Oswaldo Conroy, PA-C   BP 122/67 mmHg  Pulse 105  Temp(Src) 99.6 F (37.6 C) (Oral)  Resp 20  SpO2 95%  LMP 08/12/2014 (Exact Date) Physical Exam  Constitutional: She is oriented to person, place, and time. She appears well-developed and well-nourished.  HENT:  Head: Normocephalic and atraumatic.  Eyes: Conjunctivae and EOM are normal. Pupils are equal, round, and reactive to light.  Neck: Normal range of motion. Neck supple.  Cardiovascular: Regular rhythm.  Exam reveals no gallop and no friction rub.   No murmur heard. Mildly tachycardic  Pulmonary/Chest: Effort normal and breath sounds normal. No respiratory distress. She has no wheezes. She has no rales. She exhibits no tenderness.  Abdominal: Soft. Bowel sounds are normal. She exhibits no distension and no mass. There is no tenderness. There is no rebound and no guarding.  Musculoskeletal: Normal range of motion. She exhibits no edema or tenderness.  Neurological: She is alert and oriented to person, place, and time.  Skin: Skin is warm and dry.  Psychiatric: She has a normal mood and affect. Her behavior is normal. Judgment and thought content normal.  Nursing note and vitals reviewed.   ED Course  Procedures (including critical care time) Results for orders placed or performed during the hospital encounter of 08/12/14  CBC     (if pt has PMH of COPD)  Result Value Ref Range   WBC 8.3 4.0 - 10.5 K/uL   RBC 4.66 3.87 - 5.11  MIL/uL   Hemoglobin 12.8 12.0 - 15.0 g/dL   HCT 82.9 56.2 - 13.0 %   MCV 85.6 78.0 - 100.0 fL   MCH 27.5 26.0 - 34.0 pg   MCHC 32.1 30.0 - 36.0 g/dL   RDW 86.5 78.4 - 69.6 %   Platelets 201 150 - 400 K/uL  Basic metabolic panel    (if pt has PMH of COPD)  Result Value Ref Range   Sodium 135 135 - 145 mmol/L   Potassium 3.9 3.5 - 5.1 mmol/L   Chloride 102 96 - 112 mmol/L   CO2 21 19 - 32 mmol/L   Glucose, Bld 94 70 - 99 mg/dL   BUN 11 6 - 23 mg/dL   Creatinine, Ser 2.95 0.50 - 1.10 mg/dL   Calcium 9.2 8.4 - 28.4 mg/dL   GFR calc non Af Amer 76 (L) >90 mL/min   GFR calc Af Amer 88 (L) >90 mL/min   Anion gap 12 5 - 15  D-dimer, quantitative  Result Value Ref Range   D-Dimer, Quant 0.58 (H) 0.00 - 0.48 ug/mL-FEU  I-stat troponin, ED (if patient has history of COPD)  Result Value Ref Range   Troponin i, poc 0.01 0.00 - 0.08 ng/mL   Comment 3           Dg Chest 2 View (if Patient Has Fever And/or Copd)  08/12/2014   CLINICAL DATA:  Shortness of breath and chest tightness since yesterday. Trouble breathing.  EXAM: CHEST  2 VIEW  COMPARISON:  03/21/2014  FINDINGS: The heart size and mediastinal contours are within normal limits. Both lungs are clear. Thoracic spondylosis noted. Mild convex right scoliosis.  IMPRESSION: No active cardiopulmonary disease.   Electronically Signed   By: Norva Pavlov M.D.   On: 08/12/2014 15:00   Ct Angio Chest Pe W/cm &/or Wo Cm  08/12/2014   CLINICAL DATA:  Shortness of breath since 1 a.m. yesterday. Lightheadedness and nonproductive cough. Elevated D-dimer.  EXAM: CT ANGIOGRAPHY CHEST WITH CONTRAST  TECHNIQUE: Multidetector CT imaging of the chest was performed using the standard protocol during bolus administration of intravenous contrast. Multiplanar CT image reconstructions and MIPs were obtained to evaluate the vascular anatomy.  CONTRAST:  OMNIPAQUE IOHEXOL 350 MG/ML SOLN  COMPARISON:  Chest x-ray 08/12/2014  FINDINGS: Heart: Heart size is  normal. No pericardial effusions. No significant coronary artery disease.  Vascular structures: Pulmonary arteries are well opacified. No pulmonary embolus. No thoracic aortic aneurysm or dissection.  Mediastinum/thyroid: No mediastinal, hilar, or axillary adenopathy. The visualized portion of the thyroid gland has a normal appearance.  Lungs/Airways: Mild dependent changes are identified within the lung bases. No focal consolidations or pleural effusions. No pulmonary nodules.  Upper abdomen: Unremarkable.  Chest wall/osseous structures: In the lower outer quadrant of the left breast there is a small nodule measuring 1.4 cm. This is not further characterized.  Review of the MIP images confirms the above findings.  IMPRESSION: 1. Technically adequate exam showing no pulmonary embolus. 2. Mild dependent changes in the  lung bases. No focal consolidations. 3. Left breast nodule warranting further evaluation. Diagnostic mammogram and left breast ultrasound recommended.   Electronically Signed   By: Norva PavlovElizabeth  Brown M.D.   On: 08/12/2014 19:55      EKG Interpretation None      MDM   Final diagnoses:  SOB (shortness of breath)  Breast nodule    Patient with cough, SOB.  Labs are reassuring.  Troponin is negative. Low suspicion for ACS.  Patient thinks symptoms are related to anxiety with the death of a family member.  Patient did have a temp to 101 and was tachycardic.  Will check D-dimer to attempt to rule out PE.  5:43 PM D-dimer is elevated, will proceed with CT PE study to rule out pulmonary embolus.  8:02 PM CT is negative for PE. It is remarkable for a small rest nodule, will recommend outpatient ultrasound and mammogram. Patient understands and agrees with the plan.    Roxy Horsemanobert Roshanda Balazs, PA-C 08/12/14 2005  Linwood DibblesJon Knapp, MD 08/13/14 [id number]

## 2014-08-12 NOTE — Progress Notes (Addendum)
  CARE MANAGEMENT ED NOTE 08/12/2014  Patient:  Heather Cook,Heather Cook   Account Number:  [id number]  Date Initiated:  08/12/2014  Documentation initiated by:  Radford PaxFERRERO,Zonia Caplin  Subjective/Objective Assessment:   Patient presents to Ed with sob and lightheadedness with dry cough     Subjective/Objective Assessment Detail:     Action/Plan:   Action/Plan Detail:   Anticipated DC Date:  08/12/2014     Status Recommendation to Physician:   Result of Recommendation:    Other ED Services  Consult Working Plan    DC Planning Services  Other  PCP issues    Choice offered to / List presented to:            Status of service:  Completed, signed off  ED Comments:   ED Comments Detail:  EDCM spoke to patient at bedside.  Patient confirms she [place] nto have a pcp or [place]. Patient has recently lost her job.  EDCM spoke to patient at bedside. Patient confirms she does not have a pcp or [place].  EDCM [facility], informed patient of services there and walk in times.  EDCM also provided patient with list of pcps who accept self pay patients, list of discount pharmacies and websites needymeds.org and GoodRX.com for medication assistance, phone number to inquire about the orange card, phone number to inquire about Mediciad, phone number to inquire about the Affordable Care Act, financial resources in the community such as local churches, [employer], urban ministries, and dental assistance for uninsured patients.  Patient thankfulf or resources.  No further EDCM needs at this time.

## 2015-07-18 ENCOUNTER — Encounter (HOSPITAL_COMMUNITY): Payer: Self-pay | Admitting: Nurse Practitioner

## 2015-07-18 ENCOUNTER — Emergency Department (HOSPITAL_BASED_OUTPATIENT_CLINIC_OR_DEPARTMENT_OTHER)
Admit: 2015-07-18 | Discharge: 2015-07-18 | Disposition: A | Discharge status: Home / Self Care | Payer: BC Managed Care – PPO | Attending: Physician Assistant | Admitting: Physician Assistant

## 2015-07-18 ENCOUNTER — Emergency Department (HOSPITAL_COMMUNITY)
Admission: EM | Admit: 2015-07-18 | Discharge: 2015-07-18 | Disposition: A | Discharge status: Home / Self Care | Payer: BC Managed Care – PPO | Attending: Physician Assistant | Admitting: Physician Assistant

## 2015-07-18 DIAGNOSIS — M79662 Pain in left lower leg: Secondary | ICD-10-CM | POA: Insufficient documentation

## 2015-07-18 DIAGNOSIS — Z8719 Personal history of other diseases of the digestive system: Secondary | ICD-10-CM | POA: Insufficient documentation

## 2015-07-18 DIAGNOSIS — M79609 Pain in unspecified limb: Secondary | ICD-10-CM

## 2015-07-18 DIAGNOSIS — Z880 Allergy status to penicillin: Secondary | ICD-10-CM | POA: Insufficient documentation

## 2015-07-18 LAB — CBC WITH DIFFERENTIAL/PLATELET
Basophils Absolute: 0 10*3/uL (ref 0.0–0.1)
Basophils Relative: 1 %
EOS PCT: 7 %
Eosinophils Absolute: 0.4 10*3/uL (ref 0.0–0.7)
HCT: 40.3 % (ref 36.0–46.0)
Hemoglobin: 12.6 g/dL (ref 12.0–15.0)
LYMPHS PCT: 41 %
Lymphs Abs: 2.3 10*3/uL (ref 0.7–4.0)
MCH: 27 pg (ref 26.0–34.0)
MCHC: 31.3 g/dL (ref 30.0–36.0)
MCV: 86.5 fL (ref 78.0–100.0)
MONO ABS: 0.4 10*3/uL (ref 0.1–1.0)
MONOS PCT: 7 %
Neutro Abs: 2.5 10*3/uL (ref 1.7–7.7)
Neutrophils Relative %: 44 %
Platelets: 233 10*3/uL (ref 150–400)
RBC: 4.66 MIL/uL (ref 3.87–5.11)
RDW: 13.4 % (ref 11.5–15.5)
WBC: 5.7 10*3/uL (ref 4.0–10.5)

## 2015-07-18 LAB — BASIC METABOLIC PANEL
Anion gap: 11 (ref 5–15)
BUN: 11 mg/dL (ref 6–20)
CO2: 25 mmol/L (ref 22–32)
Calcium: 9.8 mg/dL (ref 8.9–10.3)
Chloride: 104 mmol/L (ref 101–111)
Creatinine, Ser: 0.81 mg/dL (ref 0.44–1.00)
GFR calc Af Amer: >60 mL/min (ref >60)
GFR calc non Af Amer: >60 mL/min (ref >60)
GLUCOSE: 95 mg/dL (ref 65–99)
POTASSIUM: 4.1 mmol/L (ref 3.5–5.1)
Sodium: 140 mmol/L (ref 135–145)

## 2015-07-18 NOTE — ED Notes (Signed)
Patient is alert and orientedx4.  Patient was explained discharge instructions and they understood them with no questions.   

## 2015-07-18 NOTE — Discharge Instructions (Signed)
Your blood work is normal and your ultrasound study shows no blood clot. Follow up with your doctor for further evaluation of your leg cramps. Take tylenol as needed for pain.

## 2015-07-18 NOTE — ED Provider Notes (Signed)
CSN: [id number]     Arrival date & time 07/18/15  1559 History  By signing my name below, I, Elon Spanner, attest that this documentation has been prepared under the direction and in the presence of Kerrie Buffalo, NP. Electronically Signed: Elon Spanner ED Scribe. 07/18/2015. 4:14 PM.    Chief Complaint  Patient presents with  . Leg Pain   Patient is a 55 y.o. female presenting with leg pain. The history is provided by the patient. No language interpreter was used.  Leg Pain Location:  Leg Time since incident:  2 weeks Injury: no   Leg location:  L leg Pain details:    Quality: intermittently sharp.   Severity:  Moderate   Onset quality:  Sudden   Duration:  2 weeks Chronicity:  New Dislocation: no   Foreign body present:  No foreign bodies Prior injury to area:  No Relieved by:  Nothing Exacerbated by: sitting after long periods of standing. Ineffective treatments:  Acetaminophen  HPI Comments: Kaidance Pantoja is a 55 y.o. female with no chronic conditions who presents to the Emergency Department complaining of left leg pain onset two weeks ago that wakes her from sleep.  She reports she initally awoke in the night with cramping pain in bilateral calves unrelieved by stretching.  The right calf pain resolved but the left has remained waxing and waning since onset.  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  She now now notes additional pain in the anterior thigh as well as intermittent sharp radiation from the left thigh to ankle.  The pain has been unrelieved by Tylenol.  She reports a hx of intermittent cramping in the legs that resolves with stretching but has not had these episodes in a long time and has never had episodes similar to today's complaint.  She reports hx of sciatica but states this pain feels differently and denies lower back pain.  She denies leg swelling, CP, SOB,  fever, chills.  NKA. Past Medical History  Diagnosis Date  . GERD (gastroesophageal reflux disease)   . History of stomach ulcers   . Sciatica    History reviewed. No pertinent past surgical history. History reviewed. No pertinent family history. Social History  Substance Use Topics  . Smoking status: Never Smoker   . Smokeless tobacco: None  . Alcohol Use: No   OB History    No data available     Review of Systems A complete 10 system review of systems was obtained and all systems are negative except as noted in the HPI and PMH.   Allergies  Other; Almond oil; Peanuts; Penicillins; and Strawberry extract  Home Medications   Prior to Admission medications   Medication Sig Start Date End Date Taking? Authorizing Provider  azithromycin (ZITHROMAX Z-PAK) 250 MG tablet Take 1 tablet (250 mg total) by mouth daily.  PO day 1, then  PO days 205 08/12/14   Roxy Horseman, PA-C  HYDROcodone-acetaminophen (NORCO/VICODIN) 5-325 MG per tablet Take 1 tablet by mouth every 4 (four) hours as needed for moderate pain or severe pain. Patient not taking: Reported on 08/12/2014 03/23/14   Oswaldo Conroy, PA-C  ondansetron (ZOFRAN ODT) 8 MG disintegrating tablet Take 1 tablet (8 mg total) by mouth every 8 (eight) hours as needed for nausea or vomiting. Patient not taking: Reported on 08/12/2014 03/15/14   Azalia Bilis, MD  promethazine [facility])  25 MG suppository Place 1 suppository (25 mg total) rectally every 6 (six) hours as needed for nausea or vomiting. Patient not taking: Reported on 08/12/2014 03/23/14   Oswaldo Conroy, PA-C   BP 130/77 mmHg  Pulse 72  Temp(Src) 98 F (36.7 C) (Oral)  Resp 18  SpO2 100% Physical Exam  Constitutional: She is oriented to person, place, and time. She appears well-developed and well-nourished. No distress.  HENT:  Head: Normocephalic and atraumatic.  Eyes: Conjunctivae and EOM are normal.  Neck: Neck supple. No tracheal deviation present.   Cardiovascular: Normal rate.   Pulmonary/Chest: Effort normal. No respiratory distress.  Musculoskeletal: Normal range of motion.  Pedal pulses 2+.  Adequate circulation.  No pitting edema of the LE's.  calves measured 36.5 cm bilaterally.  Left calf tenderness  Neurological: She is alert and oriented to person, place, and time.  Skin: Skin is warm and dry.  Psychiatric: She has a normal mood and affect. Her behavior is normal.  Nursing note and vitals reviewed.   ED Course  Procedures (including critical care time)  DIAGNOSTIC STUDIES: Oxygen Saturation is 97% on RA, normal by my interpretation.    COORDINATION OF CARE:  4:51 PM Will order DVT study of left calf.  Patient acknowledges and agrees with plan.     MDM  55 y.o. female with left calf pain and bilateral leg cramps off and on x 2 weeks stable for d/c without DVT on ultrasound. She will follow up with her PCP or return here as needed.   Final diagnoses:  Pain of left lower leg   I personally performed the services described in this documentation, which was scribed in my presence. The recorded information has been reviewed and is accurate.    [place]o, NP 07/23/15 1917  Abelino Derrick, MD 07/28/15 [phone]

## 2015-07-18 NOTE — ED Notes (Signed)
US tech at bedside

## 2015-07-18 NOTE — ED Notes (Signed)
Pt states she woke in the middle of the night 2 weeks ago with BLE pain that has persisted since. The R leg pain has decreased but the L leg pain has increased severely since yesterday and now she feels she can only stand or walk short distances due to pain. She denies any injuries or back pain. She has tried tylenol with no relief

## 2015-07-18 NOTE — Progress Notes (Signed)
Preliminary results by tech - Left Lower Ext. Venous Duplex Completed. Negative for deep and superficial vein thrombosis.  Raylen Ken, BS, RDMS, RVT  

## 2015-10-01 ENCOUNTER — Encounter (HOSPITAL_COMMUNITY): Payer: Self-pay

## 2015-10-01 ENCOUNTER — Emergency Department (HOSPITAL_COMMUNITY)
Admission: EM | Admit: 2015-10-01 | Discharge: 2015-10-01 | Disposition: A | Discharge status: Home / Self Care | Payer: BC Managed Care – PPO | Attending: Emergency Medicine | Admitting: Emergency Medicine

## 2015-10-01 ENCOUNTER — Emergency Department (HOSPITAL_COMMUNITY): Payer: BC Managed Care – PPO

## 2015-10-01 DIAGNOSIS — Z880 Allergy status to penicillin: Secondary | ICD-10-CM | POA: Diagnosis not present

## 2015-10-01 DIAGNOSIS — M79662 Pain in left lower leg: Secondary | ICD-10-CM | POA: Diagnosis present

## 2015-10-01 DIAGNOSIS — Z8719 Personal history of other diseases of the digestive system: Secondary | ICD-10-CM | POA: Insufficient documentation

## 2015-10-01 DIAGNOSIS — M5442 Lumbago with sciatica, left side: Secondary | ICD-10-CM

## 2015-10-01 DIAGNOSIS — M79605 Pain in left leg: Secondary | ICD-10-CM

## 2015-10-01 MED ORDER — PREDNISONE 20 MG PO TABS: ORAL_TABLET | ORAL | Status: DC

## 2015-10-01 MED ORDER — ACETAMINOPHEN 500 MG PO TABS
1000.0000 mg | ORAL_TABLET | Freq: Once | ORAL | Status: AC
  Administered 2015-10-01: 1000 mg via ORAL
  Filled 2015-10-01: qty 2

## 2015-10-01 NOTE — ED Notes (Signed)
Pt verbalized understanding of discharge instructions and follow up care

## 2015-10-01 NOTE — Discharge Instructions (Signed)
1. Medications: Acetaminophen 1000mg  4x/day for pain control; prednisone taper as directed, usual home medications 2. Treatment: rest, drink plenty of fluids, gentle stretching as discussed, alternate ice and heat 3. Follow Up: Please followup with orthopedics in 7-10 days for discussion of your diagnoses and further evaluation after today's visit; if you do not have a primary care doctor use the resource guide provided to find one;  Return to the ER for worsening back pain, difficulty walking, loss of bowel or bladder control or other concerning symptoms     Back Exercises The following exercises strengthen the muscles that help to support the back. They also help to keep the lower back flexible. Doing these exercises can help to prevent back pain or lessen existing pain. If you have back pain or discomfort, try doing these exercises 2-3 times each day or as told by your health care provider. When the pain goes away, do them once each day, but increase the number of times that you repeat the steps for each exercise (do more repetitions). If you do not have back pain or discomfort, do these exercises once each day or as told by your health care provider. EXERCISES Single Knee to Chest Repeat these steps 3-5 times for each leg: 1. Lie on your back on a firm bed or the floor with your legs extended. 2. Bring one knee to your chest. Your other leg should stay extended and in contact with the floor. 3. Hold your knee in place by grabbing your knee or thigh. 4. Pull on your knee until you feel a gentle stretch in your lower back. 5. Hold the stretch for 10-30 seconds. 6. Slowly release and straighten your leg. Pelvic Tilt Repeat these steps 5-10 times: 1. Lie on your back on a firm bed or the floor with your legs extended. 2. Bend your knees so they are pointing toward the ceiling and your feet are flat on the floor. 3. Tighten your lower abdominal muscles to press your lower back against the  floor. This motion will tilt your pelvis so your tailbone points up toward the ceiling instead of pointing to your feet or the floor. 4. With gentle tension and even breathing, hold this position for 5-10 seconds. Cat-Cow Repeat these steps until your lower back becomes more flexible: 1. Get into a hands-and-knees position on a firm surface. Keep your hands under your shoulders, and keep your knees under your hips. You may place padding under your knees for comfort. 2. Let your head hang down, and point your tailbone toward the floor so your lower back becomes rounded like the back of a cat. 3. Hold this position for 5 seconds. 4. Slowly lift your head and point your tailbone up toward the ceiling so your back forms a sagging arch like the back of a cow. 5. Hold this position for 5 seconds. Press-Ups Repeat these steps 5-10 times: 1. Lie on your abdomen (face-down) on the floor. 2. Place your palms near your head, about shoulder-width apart. 3. While you keep your back as relaxed as possible and keep your hips on the floor, slowly straighten your arms to raise the top half of your body and lift your shoulders. Do not use your back muscles to raise your upper torso. You may adjust the placement of your hands to make yourself more comfortable. 4. Hold this position for 5 seconds while you keep your back relaxed. 5. Slowly return to lying flat on the floor. Bridges Repeat these steps  10 times: 1. Lie on your back on a firm surface. 2. Bend your knees so they are pointing toward the ceiling and your feet are flat on the floor. 3. Tighten your buttocks muscles and lift your buttocks off of the floor until your waist is at almost the same height as your knees. You should feel the muscles working in your buttocks and the back of your thighs. If you do not feel these muscles, slide your feet 1-2 inches farther away from your buttocks. 4. Hold this position for 3-5 seconds. 5. Slowly lower your hips to  the starting position, and allow your buttocks muscles to relax completely. If this exercise is too easy, try doing it with your arms crossed over your chest. Abdominal Crunches Repeat these steps 5-10 times: 1. Lie on your back on a firm bed or the floor with your legs extended. 2. Bend your knees so they are pointing toward the ceiling and your feet are flat on the floor. 3. Cross your arms over your chest. 4. Tip your chin slightly toward your chest without bending your neck. 5. Tighten your abdominal muscles and slowly raise your trunk (torso) high enough to lift your shoulder blades a tiny bit off of the floor. Avoid raising your torso higher than that, because it can put too much stress on your low back and it does not help to strengthen your abdominal muscles. 6. Slowly return to your starting position. Back Lifts Repeat these steps 5-10 times: 1. Lie on your abdomen (face-down) with your arms at your sides, and rest your forehead on the floor. 2. Tighten the muscles in your legs and your buttocks. 3. Slowly lift your chest off of the floor while you keep your hips pressed to the floor. Keep the back of your head in line with the curve in your back. Your eyes should be looking at the floor. 4. Hold this position for 3-5 seconds. 5. Slowly return to your starting position. SEEK MEDICAL CARE IF:  Your back pain or discomfort gets much worse when you do an exercise.  Your back pain or discomfort does not lessen within 2 hours after you exercise. If you have any of these problems, stop doing these exercises right away. Do not do them again unless your health care provider says that you can. SEEK IMMEDIATE MEDICAL CARE IF:  You develop sudden, severe back pain. If this happens, stop doing the exercises right away. Do not do them again unless your health care provider says that you can.   This information is not intended to replace advice given to you by your health care provider. Make  sure you discuss any questions you have with your health care provider.   Document Released: 06/23/2004 Document Revised: 02/04/2015 Document Reviewed: 07/10/2014 Elsevier Interactive Patient Education [employer]

## 2015-10-01 NOTE — ED Notes (Signed)
Pt reports bilateral leg pain, worse on left leg. She was seen here a month ago for same and DVT was ruled out and with told her potassium was within defined limits. She states the pain just has not went away and is too painful to walk over the past 2 days.

## 2015-10-01 NOTE — ED Provider Notes (Signed)
CSN: [id number]     Arrival date & time 10/01/15  1631 History   First MD Initiated Contact with Patient 10/01/15 2058     Chief Complaint  Patient presents with  . Leg Pain     (Consider location/radiation/quality/duration/timing/severity/associated sxs/prior Treatment) The history is provided by the patient and medical records. No language interpreter was used.     Heather Cook is a 55 y.o. female  with a hx of GERD, Sciatica, PUD presents to the Emergency Department complaining of gradual, persistent, progressively worsening bilateral lower leg pain onset several months ago.  Pt describes the pain as a "cramp" in the calf, worse in the left leg.  She reports it was initially at night, but now it is all the time.  She reports standing and walking makes the pain significantly worse. She reports the pain is now from the middle of the plantar surface of the foot to the proximal calf.  She reports the pain begins in the leg and radiates to the feet.  Pt reports that resting the legs makes it better.  She reports mild swelling at the ankles of bilateral legs onset 1 week ago.  Associated symptoms include intermittent bilateral lower back pain worse with bending onset several weeks ago, but after the onset of the leg pain.  Pt denies fever, chills, headaches, neck pain, chest pain, SOB, abd pain, N/V/D, weakness, dizziness, numbness, dysuria, hematuria.  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    Record review shows that pt was seen on 07/18/15 for the same pain.  At that time a venous duplex was negative and her electrolytes were normal.  She reports no treatments have been tried since that time and she did not ever follow-up with anyone about the pain.   She reports that she "cannot" take medication because it makes her "sick."    Past Medical History  Diagnosis Date  . GERD (gastroesophageal reflux disease)   . History of stomach ulcers   . Sciatica    History reviewed.  No pertinent past surgical history. No family history on file. Social History  Substance Use Topics  . Smoking status: Never Smoker   . Smokeless tobacco: None  . Alcohol Use: No   OB History    No data available     Review of Systems  Constitutional: Negative for fever, diaphoresis, appetite change, fatigue and unexpected weight change.  HENT: Negative for mouth sores.   Eyes: Negative for visual disturbance.  Respiratory: Negative for cough, chest tightness, shortness of breath and wheezing.   Cardiovascular: Negative for chest pain.  Gastrointestinal: Negative for nausea, vomiting, abdominal pain, diarrhea and constipation.  Endocrine: Negative for polydipsia, polyphagia and polyuria.  Genitourinary: Negative for dysuria, urgency, frequency and hematuria.  Musculoskeletal: Positive for myalgias (bilateral leg pain) and back pain. Negative for neck stiffness.  Skin: Negative for rash.  Allergic/Immunologic: Negative for immunocompromised state.  Neurological: Negative for syncope, light-headedness and headaches.  Hematological: Does not bruise/bleed easily.  Psychiatric/Behavioral: Negative for sleep disturbance. The patient is not nervous/anxious.       Allergies  Other; Almond oil; Peanuts; Penicillins; and Strawberry extract  Home Medications   Prior to Admission medications   Medication Sig Start Date End Date Taking? Authorizing Provider  predniSONE (DELTASONE) 20 MG tablet 3 tabs po daily x 3 days, then 2 tabs x 3 days, then 1.5 tabs x 3 days, then 1 tab x 3 days, then 0.5  tabs x 3 days 10/01/15   Dahlia Client Calleen Alvis, PA-C   BP 127/65 mmHg  Pulse 62  Temp(Src) 98.5 F (36.9 C) (Oral)  Resp 16  SpO2 100%  LMP 08/12/2014 (Exact Date) Physical Exam  Constitutional: She appears well-developed and well-nourished. No distress.  HENT:  Head: Normocephalic and atraumatic.  Mouth/Throat: Oropharynx is clear and moist. No oropharyngeal exudate.  Eyes: Conjunctivae are  normal.  Neck: Normal range of motion. Neck supple.  Full ROM without pain  Cardiovascular: Normal rate, regular rhythm, normal heart sounds and intact distal pulses.   Pulmonary/Chest: Effort normal and breath sounds normal. No respiratory distress. She has no wheezes.  Abdominal: Soft. She exhibits no distension. There is no tenderness.  Musculoskeletal: Normal range of motion. She exhibits edema. She exhibits no tenderness.  Full range of motion of the T-spine and L-spine No tenderness to palpation of the spinous processes of the T-spine;  Mild tenderness to the midline spinous processes L-spine No tenderness to palpation of the paraspinous muscles of the L-spine Trace edema to the bilateral ankles without pitting No TTP of the calfs bilaterally  Lymphadenopathy:    She has no cervical adenopathy.  Neurological: She is alert. She has normal reflexes.  Reflex Scores:      Bicep reflexes are 2+ on the right side and 2+ on the left side.      Brachioradialis reflexes are 2+ on the right side and 2+ on the left side.      Patellar reflexes are 2+ on the right side and 2+ on the left side.      Achilles reflexes are 2+ on the right side and 2+ on the left side. Speech is clear and goal oriented, follows commands Normal 5/5 strength in upper and lower extremities bilaterally including dorsiflexion and plantar flexion, strong and equal grip strength Sensation normal to light and sharp touch Moves extremities without ataxia, coordination intact Normal gait Normal balance No Clonus   Skin: Skin is warm and dry. No rash noted. She is not diaphoretic. No erythema.  Psychiatric: She has a normal mood and affect. Her behavior is normal.  Nursing note and vitals reviewed.   ED Course  Procedures (including critical care time) Labs Review Labs Reviewed - No data to display  Imaging Review Dg Lumbar Spine Complete  10/01/2015  CLINICAL DATA:  Low back and leg pain for the past several  months. EXAM: LUMBAR SPINE - COMPLETE 4+ VIEW COMPARISON:  CT abdomen pelvis - 03/15/2014 FINDINGS: There are 5 non rib-bearing lumbar type vertebral bodies with diminutive ribs seen bilaterally at T12. There is a mild scoliotic curvature of the thoracolumbar spine with dominant caudal component convex to the left. There is mild straightening of the expected lumbar lordosis. No anterolisthesis or retrolisthesis. No definite pars defects. Lumbar vertebral body heights appear preserved. 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 Limited visualization of the bilateral SI joints and hips is normal. Moderate colonic stool burden without evidence of enteric obstruction. IMPRESSION: 1. No acute findings. 2. 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 Electronically Signed   By: Simonne Come M.D.   On: 10/01/2015 22:15   I have personally reviewed and evaluated these images and lab results as part of my medical decision-making.   MDM   Final diagnoses:  Left leg pain  Midline low back pain with left-sided sciatica    Heather Cook presents  for persistent bilateral leg pain, worse in the left.  Pt with Midline spinal tenderness. Pain does radiate from the low back through the buttock, posterior thigh, posterior calf and into the foot.  Full range of motion of the T-spine and L-spine.  Neurologic exam. Symptoms consistent with sciatica.    Record reviewed from previous visit with negative venous duplex and normal potassium.  History and physical is not consistent with DVT. Patient without increased risk for same.  Plain film of the L-spine shows degenerative disc disease but no cancerous lesions. Patient will need outpatient MRI. Recommend follow-up with orthopedics.  Patient can walk without difficulty or gait disturbance.  No loss of bowel or bladder control.  No concern for cauda equina.  No fever,  night sweats, weight loss, h/o cancer, IVDU.  RICE protocol, steroids and acetaminophen indicated and discussed with patient.   The patient was discussed with and seen by Dr. Criss Alvine who agrees with the treatment plan.   BP 125/78 mmHg  Pulse 79  Temp(Src) 98.5 F (36.9 C) (Oral)  Resp 18  SpO2 100%  LMP 08/12/2014 (Exact Date)   Dierdre Forth, PA-C 10/01/15 2254  Pricilla Loveless, MD 10/02/15 [phone]

## 2015-11-15 ENCOUNTER — Encounter (HOSPITAL_COMMUNITY): Payer: Self-pay | Admitting: Emergency Medicine

## 2015-11-15 ENCOUNTER — Emergency Department (HOSPITAL_COMMUNITY)
Admission: EM | Admit: 2015-11-15 | Discharge: 2015-11-16 | Disposition: A | Discharge status: Home / Self Care | Payer: BC Managed Care – PPO | Attending: Emergency Medicine | Admitting: Emergency Medicine

## 2015-11-15 DIAGNOSIS — M5431 Sciatica, right side: Secondary | ICD-10-CM

## 2015-11-15 DIAGNOSIS — M545 Low back pain: Secondary | ICD-10-CM | POA: Diagnosis present

## 2015-11-15 DIAGNOSIS — M5441 Lumbago with sciatica, right side: Secondary | ICD-10-CM | POA: Insufficient documentation

## 2015-11-15 MED ORDER — DEXAMETHASONE SODIUM PHOSPHATE 4 MG/ML IJ SOLN
10.0000 mg | Freq: Once | INTRAMUSCULAR | Status: DC
  Filled 2015-11-15: qty 3

## 2015-11-15 MED ORDER — MORPHINE SULFATE (PF) 4 MG/ML IV SOLN
4.0000 mg | Freq: Once | INTRAVENOUS | Status: DC
  Filled 2015-11-15: qty 1

## 2015-11-15 MED ORDER — GABAPENTIN 300 MG PO CAPS
300.0000 mg | ORAL_CAPSULE | Freq: Once | ORAL | Status: AC
  Administered 2015-11-16: 300 mg via ORAL
  Filled 2015-11-15: qty 1

## 2015-11-15 NOTE — ED Provider Notes (Signed)
CSN: [id number]     Arrival date & time 11/15/15  2127 History  By signing my name below, I, [name], attest that this documentation has been prepared under the direction and in the presence of Shon Batonourtney F Horton, MD. Electronically Signed: [name], ED Scribe. 11/15/2015. 11:33 PM.     Chief Complaint  Patient presents with  . Back Pain   The history is provided by the patient. No language interpreter was used.   HPI Comments: Heather Cook is a 55 y.o. female with h/o sciatica who presents to the Emergency Department complaining of moderate, 10/10 lower back pain with radiation to the right hip onset 2 months ago and worsened 3 days ago. Pt denies recent trauma, injury, heavy lifting, falls, twisting, bending. Pt states that pain is worsened with movement, ambulation and in certain positions. She reports that she was diagnosed with sciatica when she had right leg pain 2 months ago, but has not yet followed up with orthopedics. She states she was given a course of steroids for this pain that provided temporary relief. Per pt, her current pain feels similar to the pain she previously felt. She states she has been taking Tylenol Arthritis with temporary relief of pain. Pt is ambulatory with minimal difficulty. No h/o cancer, IVDU, DM, back surgery. Denies bowel or bladder incontinence, saddle anesthesia, fever, cough, abdominal pain, nausea, emesis, dysuria, hematuria, frequency, rash. Denies numbness, focal weakness or paresthesia of the lower extremities. Pt is not currently followed by a PCP.    Past Medical History  Diagnosis Date  . GERD (gastroesophageal reflux disease)   . History of stomach ulcers   . Sciatica    History reviewed. No pertinent past surgical history. Family History  Problem Relation Age of Onset  . Adopted: Yes  . Family history unknown: Yes   Social History  Substance Use Topics  . Smoking status: Never Smoker   . Smokeless tobacco: Never Used  . Alcohol Use:  No   OB History    Gravida Para Term Preterm AB TAB SAB Ectopic Multiple Living   3 2 2  1  1         Review of Systems  Constitutional: Negative for fever.  Respiratory: Negative for cough.   Gastrointestinal: Negative for nausea, vomiting and abdominal pain.       Negative for bowel incontinence.   Genitourinary: Negative for dysuria, frequency and hematuria.       Negative for bladder incontinence and saddle anesthesia.   Musculoskeletal: Positive for back pain.  Skin: Negative for rash.  Neurological: Negative for weakness and numbness.       Negative for paresthesias.   All other systems reviewed and are negative.  Allergies  Other; Almond oil; Peanuts; Penicillins; and Strawberry extract  Home Medications   Prior to Admission medications   Medication Sig Start Date End Date Taking? Authorizing Provider  gabapentin (NEURONTIN) 300 MG capsule Take 1 capsule (300 mg total) by mouth 2 (two) times daily. 11/16/15   Shon Batonourtney F Horton, MD  oxyCODONE-acetaminophen (PERCOCET/ROXICET) 5-325 MG tablet Take 1 tablet by mouth every 6 (six) hours as needed for moderate pain or severe pain. 11/16/15   Shon Batonourtney F Horton, MD  predniSONE (DELTASONE) 20 MG tablet 3 tabs po daily x 3 days, then 2 tabs x 3 days, then 1.5 tabs x 3 days, then 1 tab x 3 days, then 0.5 tabs x 3 days 11/16/15   Shon Batonourtney F Horton, MD   BP 146/63 mmHg  Pulse 79  Temp(Src) 98.5 F (36.9 C) (Oral)  Resp 18  Ht  (1.549 m)  Wt 180 lb (81.647 kg)  BMI 34.03 kg/m2  SpO2 100%  LMP 08/12/2014 (Exact Date) Physical Exam  Constitutional: She is oriented to person, place, and time. She appears well-developed and well-nourished.  HENT:  Head: Normocephalic and atraumatic.  Cardiovascular: Normal rate, regular rhythm and normal heart sounds.   No murmur heard. Pulmonary/Chest: Effort normal and breath sounds normal. No respiratory distress. She has no wheezes.  Abdominal: Soft. There is no tenderness.   Musculoskeletal:  Tenderness to palpation lower lumbar spine in the midline, no step-off or deformity noted, positive right straight leg raise  Neurological: She is alert and oriented to person, place, and time.  5 out of 5 strength bilateral lower extremities with plantar and dorsiflexion, hip flexion and extension, knee flexion and extension, symmetric reflexes bilaterally, no clonus  Skin: Skin is warm and dry.  Psychiatric: She has a normal mood and affect.  Nursing note and vitals reviewed.   ED Course  Procedures (including critical care time) DIAGNOSTIC STUDIES: Oxygen Saturation is 100% on RA, normal by my interpretation.    COORDINATION OF CARE: 11:31 PM Discussed treatment plan with pt at bedside which includes steroids, orthopedics f/u and pt agreed to plan.   Labs Review Labs Reviewed - No data to display  Imaging Review No results found. I have personally reviewed and evaluated these images and lab results as part of my medical decision-making.   EKG Interpretation None      MDM   Final diagnoses:  Sciatica of right side    She presents with back pain. Acute on chronic. Worsening of the last 3 days. History of sciatica. Has not followed up with orthopedics. No signs or symptoms at this time of cauda equina. Neurologically intact. Does not tolerate NSAIDs. Discussed at length with the patient that she needs to follow-up with orthopedics and likely need an outpatient MRI. Will discharge home with a Medrol Dosepak, gabapentin, And 2 day supply of oxycodone.  After history, exam, and medical workup I feel the patient has been appropriately medically screened and is safe for discharge home. Pertinent diagnoses were discussed with the patient. Patient was given return precautions.  I personally performed the services described in this documentation, which was scribed in my presence. The recorded information has been reviewed and is accurate.   Shon Baton,  MD 11/16/15 [phone]

## 2015-11-15 NOTE — ED Notes (Signed)
Pt c/o chronic back pain that has gotten worse and right hip pain, denies any injury, denies any problems with urination, pain is worse with movement,

## 2015-11-15 NOTE — ED Notes (Signed)
Pt with chronic back pain, states she has been worse over the last 3 days.

## 2015-11-16 MED ORDER — PREDNISONE 20 MG PO TABS: ORAL_TABLET | ORAL | Status: DC

## 2015-11-16 MED ORDER — OXYCODONE-ACETAMINOPHEN 5-325 MG PO TABS: 1.0000 | ORAL_TABLET | Freq: Four times a day (QID) | ORAL | Status: DC | PRN

## 2015-11-16 MED ORDER — OXYCODONE-ACETAMINOPHEN 5-325 MG PO TABS
1.0000 | ORAL_TABLET | Freq: Once | ORAL | Status: AC
  Administered 2015-11-16: 1 via ORAL
  Filled 2015-11-16: qty 1

## 2015-11-16 MED ORDER — GABAPENTIN 300 MG PO CAPS: 300.0000 mg | ORAL_CAPSULE | Freq: Two times a day (BID) | ORAL | Status: DC

## 2015-11-16 NOTE — Discharge Instructions (Signed)
It is very important that you follow up with orthopedics and have an outpatient MRI if pain persist.  Radicular Pain Radicular pain in either the arm or leg is usually from a bulging or herniated disk in the spine. A piece of the herniated disk may press against the nerves as the nerves exit the spine. This causes pain which is felt at the tips of the nerves down the arm or leg. Other causes of radicular pain may include:  Fractures.  Heart disease.  Cancer.  An abnormal and usually degenerative state of the nervous system or nerves (neuropathy). Diagnosis may require CT or MRI scanning to determine the primary cause.  Nerves that start at the neck (nerve roots) may cause radicular pain in the outer shoulder and arm. It can spread down to the thumb and fingers. The symptoms vary depending on which nerve root has been affected. In most cases radicular pain improves with conservative treatment. Neck problems may require physical therapy, a neck collar, or cervical traction. Treatment may take many weeks, and surgery may be considered if the symptoms do not improve.  Conservative treatment is also recommended for sciatica. Sciatica causes pain to radiate from the lower back or buttock area down the leg into the foot. Often there is a history of back problems. Most patients with sciatica are better after 2 to 4 weeks of rest and other supportive care. Short term bed rest can reduce the disk pressure considerably. Sitting, however, is not a good position since this increases the pressure on the disk. You should avoid bending, lifting, and all other activities which make the problem worse. Traction can be used in severe cases. Surgery is usually reserved for patients who do not improve within the first months of treatment. Only take over-the-counter or prescription medicines for pain, discomfort, or fever as directed by your caregiver. Narcotics and muscle relaxants may help by relieving more severe pain  and spasm and by providing mild sedation. Cold or massage can give significant relief. Spinal manipulation is not recommended. It can increase the degree of disc protrusion. Epidural steroid injections are often effective treatment for radicular pain. These injections deliver medicine to the spinal nerve in the space between the protective covering of the spinal cord and back bones (vertebrae). Your caregiver can give you more information about steroid injections. These injections are most effective when given within two weeks of the onset of pain.  You should see your caregiver for follow up care as recommended. A program for neck and back injury rehabilitation with stretching and strengthening exercises is an important part of management.  SEEK IMMEDIATE MEDICAL CARE IF:  You develop increased pain, weakness, or numbness in your arm or leg.  You develop difficulty with bladder or bowel control.  You develop abdominal pain.   This information is not intended to replace advice given to you by your health care provider. Make sure you discuss any questions you have with your health care provider.   Document Released: 06/23/2004 Document Revised: 06/06/2014 Document Reviewed: 12/10/2014 Elsevier Interactive Patient Education [employer]2016 [employer]

## 2016-04-24 IMAGING — CT CT ANGIO CHEST
1 of 2 series · 19 of 32 positions shown · IV contrast (OMNIPAQUE 350)
Comparison: Chest x-ray 08/12/2014

CLINICAL DATA: Shortness of breath since 1 a.m. yesterday.
Lightheadedness and nonproductive cough. Elevated D-dimer.

EXAM:
CT ANGIOGRAPHY CHEST WITH CONTRAST
TECHNIQUE: Multidetector CT imaging of the chest was performed using the
standard protocol during bolus administration of intravenous
contrast. Multiplanar CT image reconstructions and MIPs were
obtained to evaluate the vascular anatomy.
CONTRAST:  100mL OMNIPAQUE IOHEXOL 350 MG/ML SOLN

[Series 10: thins for pacs · axial · 0.65mm/px · z∈[-172,+6]mm · 19 of 198 slices shown]
[im 10/198  lung]
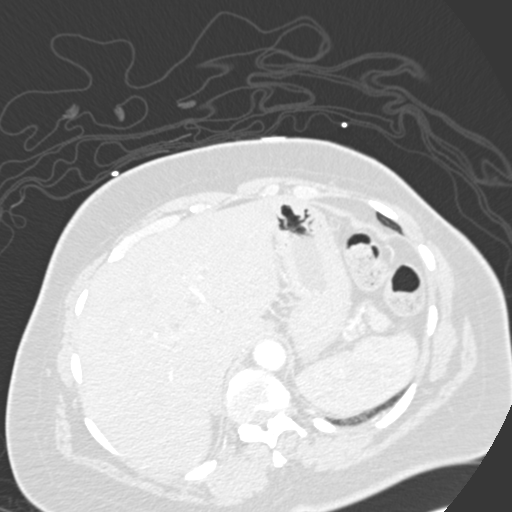
[im 20/198  mediastinal]
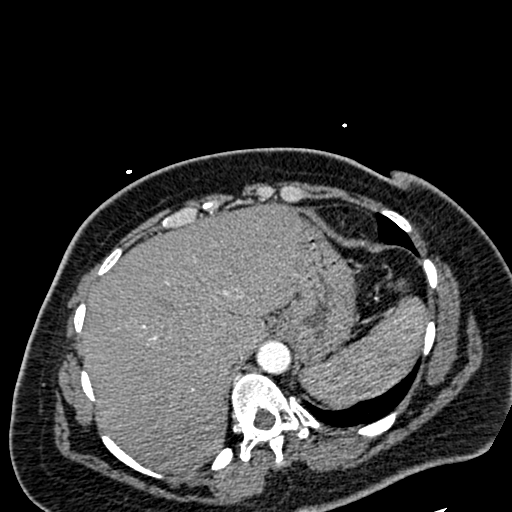
[im 30/198  lung]
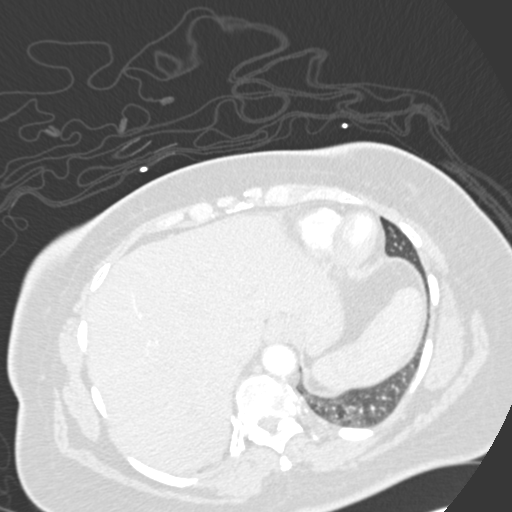
[im 50/198  mediastinal]
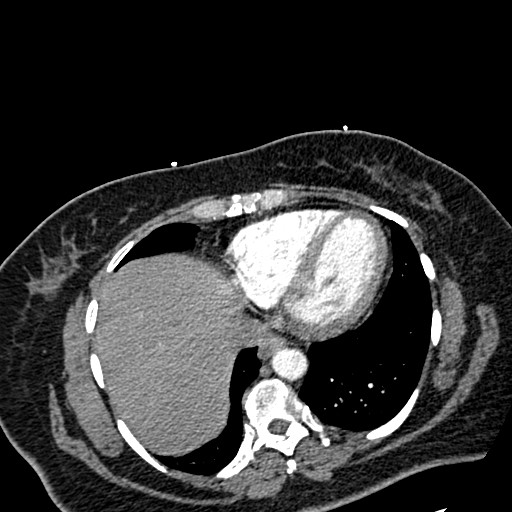
[im 60/198  lung]
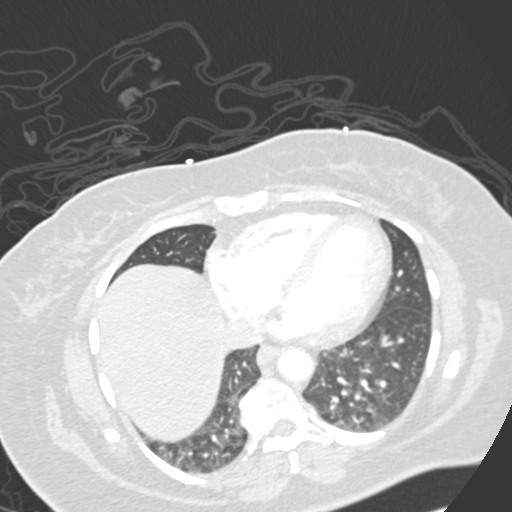
[im 66/198  mediastinal]
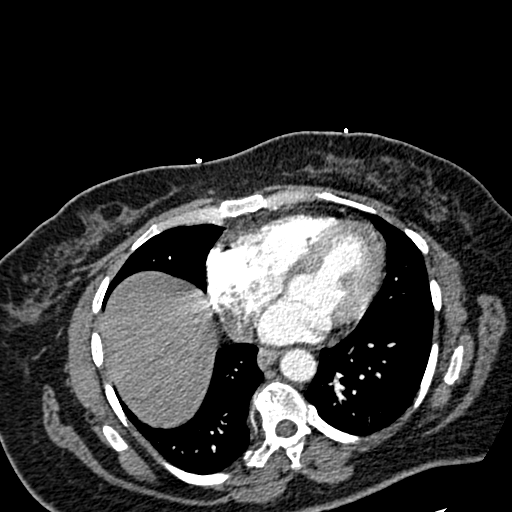
[im 69/198  lung]
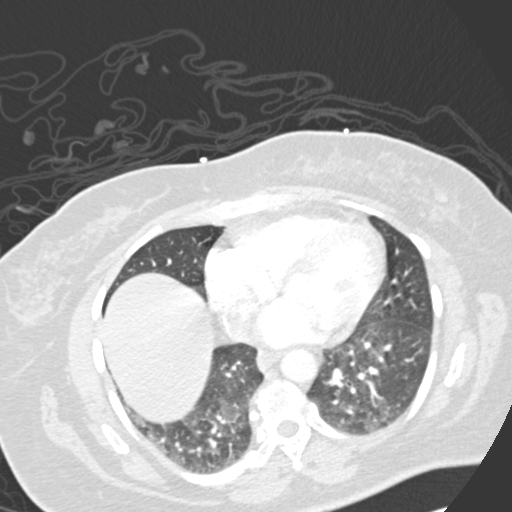
[im 79/198  mediastinal]
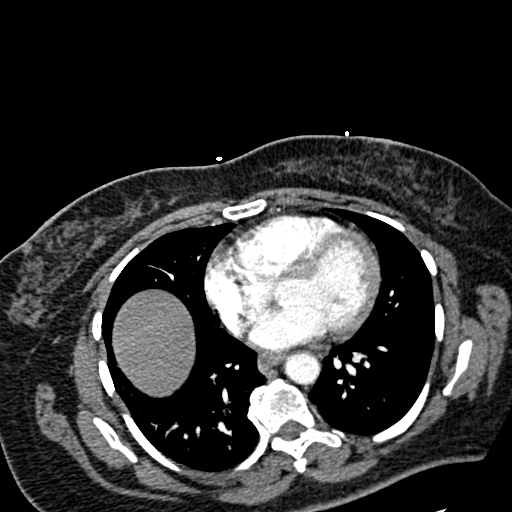
[im 89/198  lung]
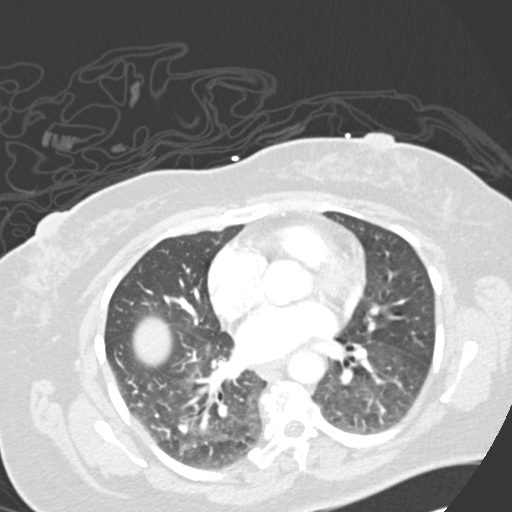
[im 99/198  mediastinal]
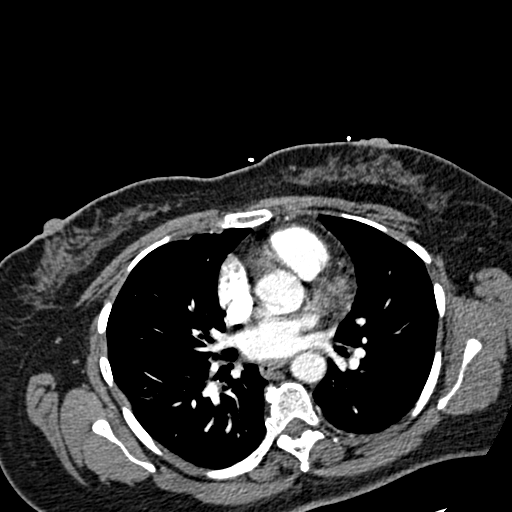
[im 109/198  lung]
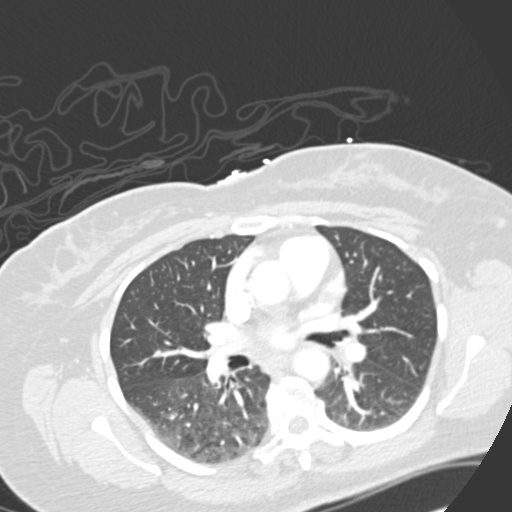
[im 119/198  mediastinal]
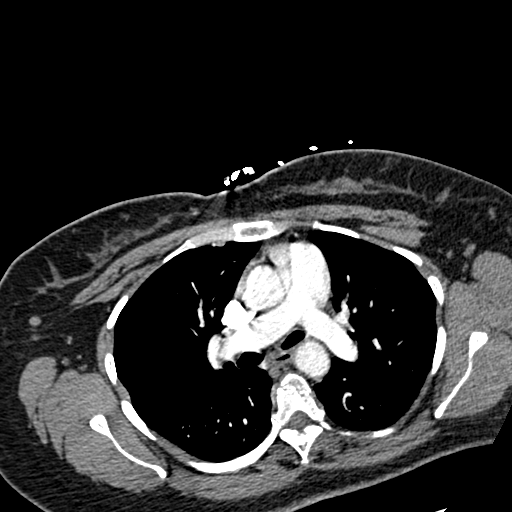
[im 129/198  lung]
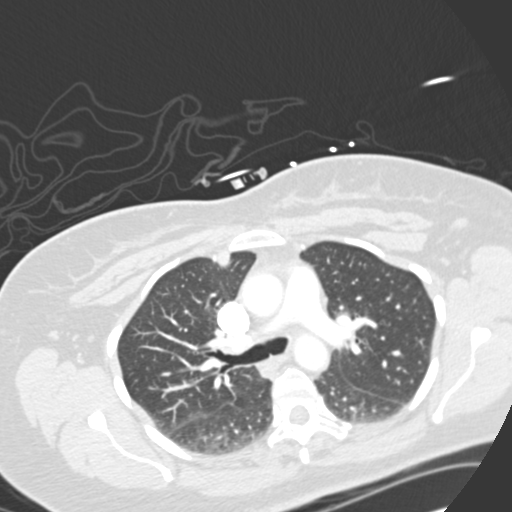
[im 132/198  mediastinal]
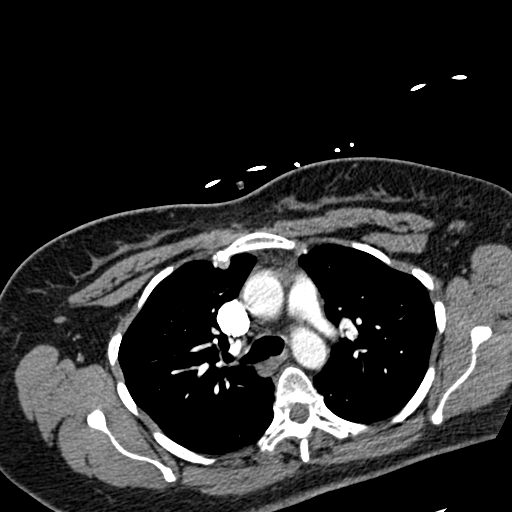
[im 138/198  lung]
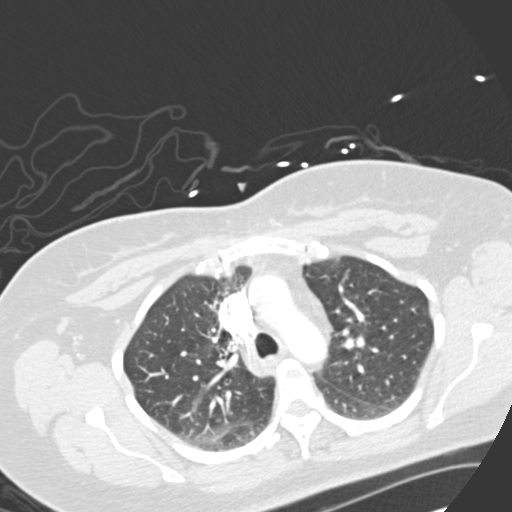
[im 148/198  mediastinal]
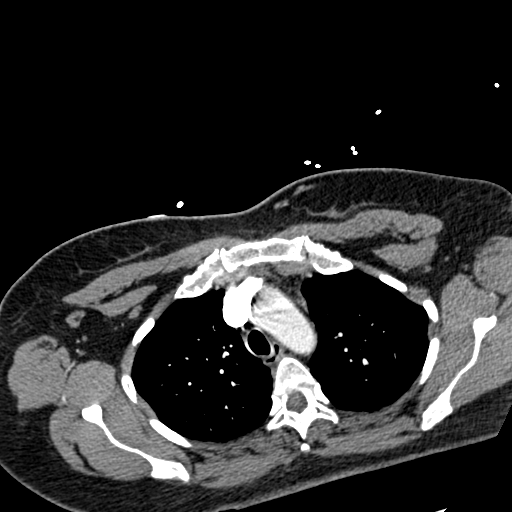
[im 168/198  lung]
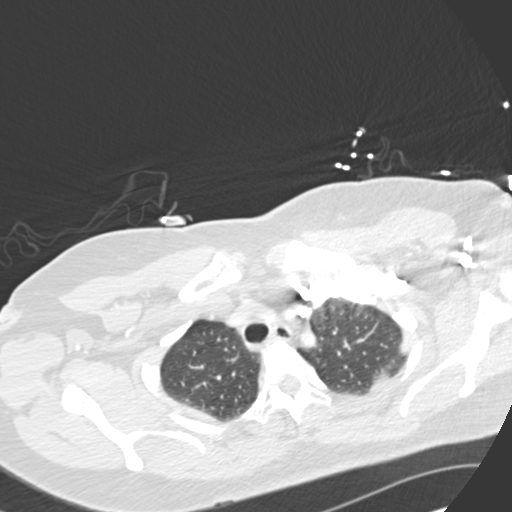
[im 178/198  mediastinal]
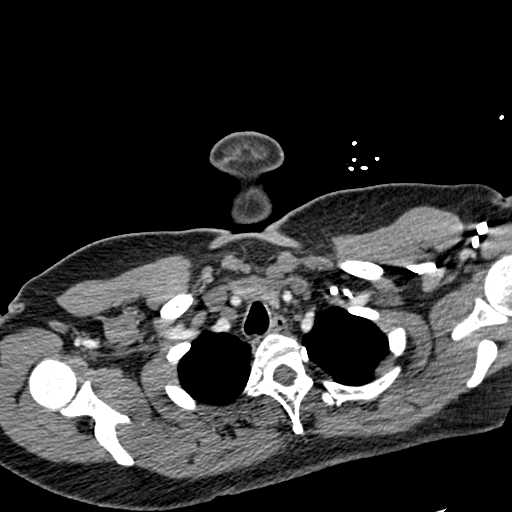
[im 188/198  lung]
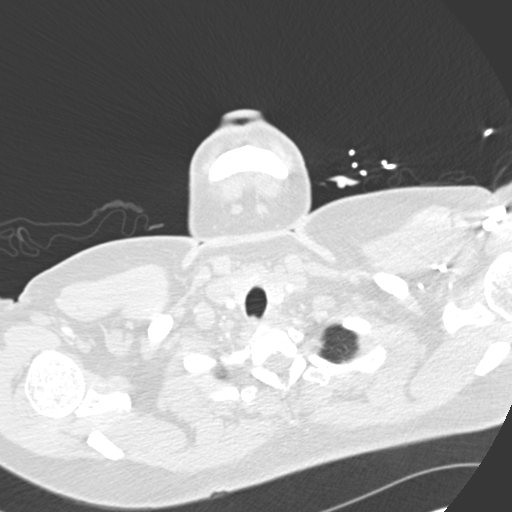

[19 of 32 positions shown; findings below may reference images not displayed]

FINDINGS: Heart: Heart size is normal. No pericardial effusions. No
significant coronary artery disease.

Vascular structures: Pulmonary arteries are well opacified. No
pulmonary embolus. No thoracic aortic aneurysm or dissection.

Mediastinum/thyroid: No mediastinal, hilar, or axillary adenopathy.
The visualized portion of the thyroid gland has a normal appearance.

Lungs/Airways: Mild dependent changes are identified within the lung
bases. No focal consolidations or pleural effusions. No pulmonary
nodules.

Upper abdomen: Unremarkable.

Chest wall/osseous structures: In the lower outer quadrant of the
left breast there is a small nodule measuring 1.4 cm. This is not
further characterized.

Review of the MIP images confirms the above findings.
IMPRESSION: 1. Technically adequate exam showing no pulmonary embolus.
2. Mild dependent changes in the lung bases. No focal
consolidations.
3. Left breast nodule warranting further evaluation. Diagnostic
mammogram and left breast ultrasound recommended.

## 2016-07-06 ENCOUNTER — Encounter (HOSPITAL_COMMUNITY): Payer: Self-pay | Admitting: *Deleted

## 2016-07-06 ENCOUNTER — Emergency Department (HOSPITAL_COMMUNITY)
Admission: EM | Admit: 2016-07-06 | Discharge: 2016-07-06 | Disposition: A | Discharge status: Home / Self Care | Payer: BC Managed Care – PPO | Attending: Emergency Medicine | Admitting: Emergency Medicine

## 2016-07-06 DIAGNOSIS — B029 Zoster without complications: Secondary | ICD-10-CM | POA: Diagnosis not present

## 2016-07-06 DIAGNOSIS — R21 Rash and other nonspecific skin eruption: Secondary | ICD-10-CM | POA: Diagnosis present

## 2016-07-06 DIAGNOSIS — B019 Varicella without complication: Secondary | ICD-10-CM

## 2016-07-06 MED ORDER — OXYCODONE-ACETAMINOPHEN 5-325 MG PO TABS: 1.0000 | ORAL_TABLET | ORAL | 0 refills | Status: DC | PRN

## 2016-07-06 MED ORDER — VALACYCLOVIR HCL 1 G PO TABS: 1000.0000 mg | ORAL_TABLET | Freq: Three times a day (TID) | ORAL | 0 refills | Status: DC

## 2016-07-06 NOTE — ED Provider Notes (Signed)
[facility]-EMERGENCY DEPT Provider Note   CSN: [id number] Arrival date & time: 07/06/16  1721     History   Chief Complaint Chief Complaint  Patient presents with  . Rash    HPI Heather Cook is a 56 y.o. female.  Pt presents to the ED today with a rash on the left side of her body.  The pt said it is very painful.  She denies any other sx.      Past Medical History:  Diagnosis Date  . GERD (gastroesophageal reflux disease)   . History of stomach ulcers   . Sciatica     There are no active problems to display for this patient.   Past Surgical History:  Procedure Laterality Date  . TUBAL LIGATION      OB History    Gravida Para Term Preterm AB Living   3 2 2   1      SAB TAB Ectopic Multiple Live Births   1               Home Medications    Prior to Admission medications   Medication Sig Start Date End Date Taking? Authorizing Provider  gabapentin (NEURONTIN) 300 MG capsule Take 1 capsule (300 mg total) by mouth 2 (two) times daily. 11/16/15   Shon Baton, MD  oxyCODONE-acetaminophen (PERCOCET/ROXICET) 5-325 MG tablet Take 1 tablet by mouth every 4 (four) hours as needed for severe pain. 07/06/16   Jacalyn Lefevre, MD  predniSONE (DELTASONE) 20 MG tablet 3 tabs po daily x 3 days, then 2 tabs x 3 days, then 1.5 tabs x 3 days, then 1 tab x 3 days, then 0.5 tabs x 3 days 11/16/15   Shon Baton, MD  valACYclovir (VALTREX) 1000 MG tablet Take 1 tablet (1,000 mg total) by mouth 3 (three) times daily. 07/06/16   Jacalyn Lefevre, MD    Family History Family History  Problem Relation Age of Onset  . Adopted: Yes  . Family history unknown: Yes    Social History Social History  Substance Use Topics  . Smoking status: Never Smoker  . Smokeless tobacco: Never Used  . Alcohol use No     Allergies   Other; Almond oil; Peanuts [peanut oil]; Penicillins; and Strawberry extract   Review of Systems Review of Systems  Skin: Positive for rash.  All other systems  reviewed and are negative.    Physical Exam Updated Vital Signs BP 132/75 (BP Location: Left Arm)   Pulse 87   Temp 98.3 F (36.8 C) (Oral)   Resp 18   Ht 5\' 4"  (1.626 m)   Wt 180 lb (81.6 kg)   LMP 08/12/2014 (Exact Date)   SpO2 100%   BMI 30.90 kg/m   Physical Exam  Constitutional: She is oriented to person, place, and time. She appears well-developed and well-nourished.  HENT:  Head: Normocephalic and atraumatic.  Right Ear: External ear normal.  Left Ear: External ear normal.  Nose: Nose normal.  Mouth/Throat: Oropharynx is clear and moist.  Eyes: Conjunctivae and EOM are normal. Pupils are equal, round, and reactive to light.  Neck: Normal range of motion. Neck supple.  Cardiovascular: Normal rate, regular rhythm, normal heart sounds and intact distal pulses.   Pulmonary/Chest: Effort normal and breath sounds normal.  Abdominal: Soft. Bowel sounds are normal.  Musculoskeletal: Normal range of motion.  Neurological: She is alert and oriented to person, place, and time.  Skin:     Psychiatric: She has a normal mood  and affect. Her behavior is normal. Judgment and thought content normal.  Nursing note and vitals reviewed.    ED Treatments / Results  Labs (all labs ordered are listed, but only abnormal results are displayed) Labs Reviewed - No data to display  EKG  EKG Interpretation None       Radiology No results found.  Procedures Procedures (including critical care time)  Medications Ordered in ED Medications - No data to display   Initial Impression / Assessment and Plan / ED Course  I have reviewed the triage vital signs and the nursing notes.  Pertinent labs & imaging results that were available during my care of the patient were reviewed by me and considered in my medical decision making (see chart for details).     Pt to be treated with valtrex and oxycodone.  She knows to return if worse.  Final Clinical Impressions(s) / ED Diagnoses     Final diagnoses:  Varicella zoster    New Prescriptions New Prescriptions   OXYCODONE-ACETAMINOPHEN (PERCOCET/ROXICET) 5-325 MG TABLET    Take 1 tablet by mouth every 4 (four) hours as needed for severe pain.   VALACYCLOVIR (VALTREX) 1000 MG TABLET    Take 1 tablet (1,000 mg total) by mouth 3 (three) times daily.     Jacalyn LefevreJulie Kanyon Seibold, MD 07/06/16 [phone]

## 2016-07-06 NOTE — ED Triage Notes (Signed)
Pt comes in with rash on left side starting 1 week ago. Pt states the area started as bumps and are now more whelp like. NAD noted.

## 2017-02-28 ENCOUNTER — Encounter (HOSPITAL_COMMUNITY): Payer: Self-pay | Admitting: *Deleted

## 2017-02-28 ENCOUNTER — Emergency Department (HOSPITAL_COMMUNITY)
Admission: EM | Admit: 2017-02-28 | Discharge: 2017-02-28 | Disposition: A | Discharge status: Home / Self Care | Payer: BC Managed Care – PPO | Attending: Emergency Medicine | Admitting: Emergency Medicine

## 2017-02-28 DIAGNOSIS — R5383 Other fatigue: Secondary | ICD-10-CM

## 2017-02-28 DIAGNOSIS — H9202 Otalgia, left ear: Secondary | ICD-10-CM | POA: Diagnosis present

## 2017-02-28 DIAGNOSIS — M25473 Effusion, unspecified ankle: Secondary | ICD-10-CM

## 2017-02-28 DIAGNOSIS — R2243 Localized swelling, mass and lump, lower limb, bilateral: Secondary | ICD-10-CM | POA: Insufficient documentation

## 2017-02-28 LAB — BASIC METABOLIC PANEL
Anion gap: 7 (ref 5–15)
BUN: 14 mg/dL (ref 6–20)
CALCIUM: 9 mg/dL (ref 8.9–10.3)
CO2: 25 mmol/L (ref 22–32)
Chloride: 102 mmol/L (ref 101–111)
Creatinine, Ser: 0.93 mg/dL (ref 0.44–1.00)
GFR calc Af Amer: >60 mL/min (ref >60)
GLUCOSE: 103 mg/dL — AB (ref 65–99)
Potassium: 3.7 mmol/L (ref 3.5–5.1)
Sodium: 134 mmol/L — ABNORMAL LOW (ref 135–145)

## 2017-02-28 LAB — CBC WITH DIFFERENTIAL/PLATELET
BASOS ABS: 0 10*3/uL (ref 0.0–0.1)
BASOS PCT: 0 %
EOS ABS: 0.3 10*3/uL (ref 0.0–0.7)
Eosinophils Relative: 4 %
HCT: 35.9 % — ABNORMAL LOW (ref 36.0–46.0)
Hemoglobin: 11.6 g/dL — ABNORMAL LOW (ref 12.0–15.0)
Lymphocytes Relative: 40 %
Lymphs Abs: 2.5 10*3/uL (ref 0.7–4.0)
MCH: 26.9 pg (ref 26.0–34.0)
MCHC: 32.3 g/dL (ref 30.0–36.0)
MCV: 83.3 fL (ref 78.0–100.0)
MONO ABS: 0.5 10*3/uL (ref 0.1–1.0)
Monocytes Relative: 7 %
Neutro Abs: 3 10*3/uL (ref 1.7–7.7)
Neutrophils Relative %: 49 %
PLATELETS: 208 10*3/uL (ref 150–400)
RBC: 4.31 MIL/uL (ref 3.87–5.11)
RDW: 13.7 % (ref 11.5–15.5)
WBC: 6.2 10*3/uL (ref 4.0–10.5)

## 2017-02-28 NOTE — Discharge Instructions (Signed)
You were seen in the ED today with ear pain, ankle swelling, and generalized fatigue. You lab work was normal. You have no obvious infection in the ear to explain your symptoms. Take Tylenol and/or Motrin for pain relief. Follow up with your PCP in the coming week to review your symptoms.   Return to the ED with any drainage from the ear, fever, chills, chest pain, or difficulty breathing.

## 2017-02-28 NOTE — ED Notes (Signed)
ED Provider at bedside. 

## 2017-02-28 NOTE — ED Triage Notes (Signed)
LEFT EARACHE, SWELLING RIGHT LEG

## 2017-02-28 NOTE — ED Provider Notes (Signed)
Emergency Department Provider Note   I have reviewed the triage vital signs and the nursing notes.   HISTORY  Chief Complaint Otalgia   HPI Heather Cook is a 56 y.o. female with PMH of GERD presents to the emergency department for evaluation of left earache starting today with new onset swelling in bilateral ankles. Patient states that her ear suddenly began to feel sore. She denies any drainage or bleeding from the ear. No ear trauma. No tenderness behind ear. She denies any vision changes, fevers, chills. Denies headache. Patient also notices mild swelling to both ankles but states it seems slightly worse on the right. No calf pain or swelling. No redness. No injury to the ankles. She denies any chest pain or shortness of breath. She also notes some generalized fatigue and cites the recent loss of her son.    Past Medical History:  Diagnosis Date  . GERD (gastroesophageal reflux disease)   . History of stomach ulcers   . Sciatica     There are no active problems to display for this patient.   Past Surgical History:  Procedure Laterality Date  . TUBAL LIGATION      Current Outpatient Rx  . Order #: [id number] Class: Historical Med    Allergies Other; Almond oil; Peanuts [peanut oil]; Penicillins; and Strawberry extract  Family History  Problem Relation Age of Onset  . Adopted: Yes  . Family history unknown: Yes    Social History Social History  Substance Use Topics  . Smoking status: Never Smoker  . Smokeless tobacco: Never Used  . Alcohol use No    Review of Systems  Constitutional: No fever/chills. Positive generalized fatigue.  Eyes: No visual changes. ENT: No sore throat. Positive left ear pain.  Cardiovascular: Denies chest pain. Respiratory: Denies shortness of breath. Gastrointestinal: No abdominal pain.  No nausea, no vomiting.  No diarrhea.  No constipation. Genitourinary: Negative for dysuria. Musculoskeletal: Negative for back pain. Positive  bilateral ankle swelling.  Skin: Negative for rash. Neurological: Negative for headaches, focal weakness or numbness.  10-point ROS otherwise negative.  ____________________________________________   PHYSICAL EXAM:  VITAL SIGNS: ED Triage Vitals [02/28/17 2111]  Enc Vitals Group     BP (!) 132/59     Pulse Rate 95     Resp 20     Temp 98.3 F (36.8 C)     Temp Source Oral     SpO2 95 %     Weight 190 lb (86.2 kg)     Height  (1.575 m)     Pain Score 6   Constitutional: Alert and oriented. Well appearing and in no acute distress. Eyes: Conjunctivae are normal. PERRL. EOMI. Head: Atraumatic. Ears:  Healthy appearing ear canals and TMs bilaterally. No mastoid tenderness.  Nose: No congestion/rhinnorhea. Mouth/Throat: Mucous membranes are moist.  Oropharynx non-erythematous. Neck: No stridor. Cardiovascular: Normal rate, regular rhythm. Good peripheral circulation. Grossly normal heart sounds.   Respiratory: Normal respiratory effort.  No retractions. Lungs CTAB. Gastrointestinal: Soft and nontender. No distention.  Musculoskeletal: No lower extremity tenderness. Mild swelling to both ankles with no leg/calf swelling or erythema. No gross deformities of extremities. Neurologic:  Normal speech and language. No gross focal neurologic deficits are appreciated.  Skin:  Skin is warm, dry and intact. No rash noted.  ____________________________________________   LABS (all labs ordered are listed, but only abnormal results are displayed)  Labs Reviewed  BASIC METABOLIC PANEL - Abnormal; Notable for the following:  Result Value   Sodium 134 (*)    Glucose, Bld 103 (*)    All other components within normal limits  CBC WITH DIFFERENTIAL/PLATELET - Abnormal; Notable for the following:    Hemoglobin 11.6 (*)    HCT 35.9 (*)    All other components within normal limits     ____________________________________________  RADIOLOGY  None ____________________________________________   PROCEDURES  Procedure(s) performed:   Procedures  None ____________________________________________   INITIAL IMPRESSION / ASSESSMENT AND PLAN / ED COURSE  Pertinent labs & imaging results that were available during my care of the patient were reviewed by me and considered in my medical decision making (see chart for details).  Patient presents to the emergency department with multiple medical complaints including generalized fatigue, left ear pain, bilateral ankle swelling. No signs or symptoms to suggest DVT. Edema is mild isolated to the ankles. Patient's ENT exam is unremarkable. No mastoid tenderness. No cranial nerve deficits. For baseline labs with the patient's complaint of generalized weakness and will reassess.   Labs reviewed and unremarkable. Plan for elevation of legs and compression stockings. Will establish care with PCP.   At this time, I do not feel there is any life-threatening condition present. I have reviewed and discussed all results (EKG, imaging, lab, urine as appropriate), exam findings with patient. I have reviewed nursing notes and appropriate previous records.  I feel the patient is safe to be discharged home without further emergent workup. Discussed usual and customary return precautions. Patient and family (if present) verbalize understanding and are comfortable with this plan.  Patient will follow-up with their primary care provider. If they do not have a primary care provider, information for follow-up has been provided to them. All questions have been answered.  ____________________________________________  FINAL CLINICAL IMPRESSION(S) / ED DIAGNOSES  Final diagnoses:  Otalgia of left ear  Ankle swelling, unspecified laterality  Fatigue, unspecified type     MEDICATIONS GIVEN DURING THIS VISIT:  Medications - No data to  display   NEW OUTPATIENT MEDICATIONS STARTED DURING THIS VISIT:  None   Note:  This document was prepared using Dragon voice recognition software and may include unintentional dictation errors.  Alona Bene, MD Emergency Medicine    Lexee Brashears, Arlyss Repress, MD 02/28/17 [phone]

## 2017-07-06 ENCOUNTER — Encounter (HOSPITAL_COMMUNITY): Payer: Self-pay

## 2017-07-06 ENCOUNTER — Other Ambulatory Visit: Payer: Self-pay

## 2017-07-06 ENCOUNTER — Emergency Department (HOSPITAL_COMMUNITY): Payer: BC Managed Care – PPO

## 2017-07-06 ENCOUNTER — Emergency Department (HOSPITAL_COMMUNITY)
Admission: EM | Admit: 2017-07-06 | Discharge: 2017-07-06 | Disposition: A | Discharge status: Home / Self Care | Payer: BC Managed Care – PPO | Attending: Emergency Medicine | Admitting: Emergency Medicine

## 2017-07-06 DIAGNOSIS — Y939 Activity, unspecified: Secondary | ICD-10-CM | POA: Insufficient documentation

## 2017-07-06 DIAGNOSIS — Z79899 Other long term (current) drug therapy: Secondary | ICD-10-CM | POA: Diagnosis not present

## 2017-07-06 DIAGNOSIS — Y929 Unspecified place or not applicable: Secondary | ICD-10-CM | POA: Insufficient documentation

## 2017-07-06 DIAGNOSIS — S39012A Strain of muscle, fascia and tendon of lower back, initial encounter: Secondary | ICD-10-CM | POA: Insufficient documentation

## 2017-07-06 DIAGNOSIS — Y999 Unspecified external cause status: Secondary | ICD-10-CM | POA: Insufficient documentation

## 2017-07-06 DIAGNOSIS — W010XXA Fall on same level from slipping, tripping and stumbling without subsequent striking against object, initial encounter: Secondary | ICD-10-CM | POA: Diagnosis not present

## 2017-07-06 DIAGNOSIS — S3992XA Unspecified injury of lower back, initial encounter: Secondary | ICD-10-CM | POA: Diagnosis present

## 2017-07-06 MED ORDER — IBUPROFEN 800 MG PO TABS: 800.0000 mg | ORAL_TABLET | Freq: Three times a day (TID) | ORAL | 0 refills | Status: DC

## 2017-07-06 MED ORDER — HYDROCODONE-ACETAMINOPHEN 5-325 MG PO TABS: 1.0000 | ORAL_TABLET | ORAL | 0 refills | Status: DC | PRN

## 2017-07-06 MED ORDER — OXYCODONE-ACETAMINOPHEN 5-325 MG PO TABS: 1.0000 | ORAL_TABLET | ORAL | 0 refills | Status: DC | PRN

## 2017-07-06 MED ORDER — KETOROLAC TROMETHAMINE 60 MG/2ML IM SOLN
30.0000 mg | Freq: Once | INTRAMUSCULAR | Status: AC
  Administered 2017-07-06: 30 mg via INTRAMUSCULAR
  Filled 2017-07-06: qty 2

## 2017-07-06 MED ORDER — OXYCODONE-ACETAMINOPHEN 5-325 MG PO TABS
1.0000 | ORAL_TABLET | Freq: Once | ORAL | Status: AC
  Administered 2017-07-06: 1 via ORAL
  Filled 2017-07-06: qty 1

## 2017-07-06 MED ORDER — DIAZEPAM 5 MG PO TABS: 5.0000 mg | ORAL_TABLET | Freq: Three times a day (TID) | ORAL | 0 refills | Status: DC | PRN

## 2017-07-06 NOTE — ED Triage Notes (Signed)
Pt fell on Saturday, states she fell onto her right arm which is not painful, but is now c/o pain to her lower back.  Pt denies pain immediately after the fall

## 2017-07-06 NOTE — ED Notes (Signed)
Patient given a prepackage of hydrocodone/acetaminophen quantity six and given instructions on use, patient verbally understands.

## 2017-07-06 NOTE — ED Provider Notes (Signed)
[facility] EMERGENCY DEPARTMENT Provider Note   CSN: [id number] Arrival date & time: 07/06/17  0117     History   Chief Complaint Chief Complaint  Patient presents with  . Fall    back pain    HPI Heather Cook is a 58 y.o. female.  Patient presents for evaluation of low back pain.  Patient reports that she fell several nights ago.  Initially she did not have any pain, but later in day she started to have lower back pain which has progressively worsened.  Pain is across the lower back, does not radiate.  She reports that she does have a history of sciatica in the past, this is different.  She does not have any numbness, tingling or weakness of the legs.  No change in bowel or bladder function.  She feels better in the morning, but after she goes to work and is on her feet all day she starts having increasing pain.  She has tried Tylenol without improvement.      Past Medical History:  Diagnosis Date  . GERD (gastroesophageal reflux disease)   . History of stomach ulcers   . Sciatica     There are no active problems to display for this patient.   Past Surgical History:  Procedure Laterality Date  . TUBAL LIGATION      OB History    Gravida Para Term Preterm AB Living   3 2 2   1      SAB TAB Ectopic Multiple Live Births   1               Home Medications    Prior to Admission medications   Medication Sig Start Date End Date Taking? Authorizing Provider  acetaminophen (TYLENOL) 500 MG tablet Take 500 mg by mouth every 6 (six) hours as needed for mild pain or moderate pain.    [provider]  diazepam (VALIUM) 5 MG tablet Take 1 tablet (5 mg total) by mouth every 8 (eight) hours as needed for muscle spasms. 07/06/17   Gilda Crease, MD  ibuprofen (ADVIL,MOTRIN) 800 MG tablet Take 1 tablet (800 mg total) by mouth 3 (three) times daily. 07/06/17   Gilda Crease, MD  oxyCODONE-acetaminophen (PERCOCET) 5-325 MG tablet Take 1 tablet by mouth every  4 (four) hours as needed. 07/06/17   Gilda Crease, MD  promethazine (PHENERGAN) 25 MG suppository Place 1 suppository (25 mg total) rectally every 6 (six) hours as needed for nausea or vomiting. Patient not taking: Reported on 08/12/2014 03/23/14 10/01/15  Oswaldo Conroy, PA-C    Family History Family History  Adopted: Yes  Family history unknown: Yes    Social History Social History   Tobacco Use  . Smoking status: Never Smoker  . Smokeless tobacco: Never Used  Substance Use Topics  . Alcohol use: No  . Drug use: No     Allergies   Other; Almond oil; Morphine and related; Peanuts [peanut oil]; Penicillins; Strawberry extract; and Tramadol   Review of Systems Review of Systems  Musculoskeletal: Positive for back pain.  All other systems reviewed and are negative.    Physical Exam Updated Vital Signs BP (!) 129/59 (BP Location: Left Arm)   Pulse 79   Temp 98.8 F (37.1 C) (Oral)   Resp 20   Ht 5\' 4"  (1.626 m)   Wt 90.7 kg (200 lb)   LMP 08/12/2014 (Exact Date)   SpO2 100%   BMI 34.33 kg/m   Physical  Exam  Constitutional: She is oriented to person, place, and time. She appears well-developed and well-nourished. No distress.  HENT:  Head: Normocephalic and atraumatic.  Right Ear: Hearing normal.  Left Ear: Hearing normal.  Nose: Nose normal.  Mouth/Throat: Oropharynx is clear and moist and mucous membranes are normal.  Eyes: Conjunctivae and EOM are normal. Pupils are equal, round, and reactive to light.  Neck: Normal range of motion. Neck supple.  Cardiovascular: Regular rhythm, S1 normal and S2 normal. Exam reveals no gallop and no friction rub.  No murmur heard. Pulmonary/Chest: Effort normal and breath sounds normal. No respiratory distress. She exhibits no tenderness.  Abdominal: Soft. Normal appearance and bowel sounds are normal. There is no hepatosplenomegaly. There is no tenderness. There is no rebound, no guarding, no tenderness at McBurney's  point and negative Murphy's sign. No hernia.  Musculoskeletal: Normal range of motion.       Lumbar back: She exhibits tenderness.       Back:  Neurological: She is alert and oriented to person, place, and time. She has normal strength. No cranial nerve deficit or sensory deficit. Coordination normal. GCS eye subscore is 4. GCS verbal subscore is 5. GCS motor subscore is 6.  Skin: Skin is warm, dry and intact. No rash noted. No cyanosis.  Psychiatric: She has a normal mood and affect. Her speech is normal and behavior is normal. Thought content normal.  Nursing note and vitals reviewed.    ED Treatments / Results  Labs (all labs ordered are listed, but only abnormal results are displayed) Labs Reviewed - No data to display  EKG  EKG Interpretation None       Radiology Dg Lumbar Spine Complete  Result Date: 07/06/2017 CLINICAL DATA:  Back pain after a fall EXAM: LUMBAR SPINE - COMPLETE 4+ VIEW COMPARISON:  CT 03/13/2017 FINDINGS: Lumbar alignment within normal limits. Mild degenerative changes with diffuse mild anterior spurring. Mild disc space narrowing at L2-L3. Vertebral body heights are normal IMPRESSION: No acute osseous abnormality Electronically Signed   By: Jasmine PangKim  Fujinaga M.D.   On: 07/06/2017 02:48    Procedures Procedures (including critical care time)  Medications Ordered in ED Medications  ketorolac (TORADOL) injection 30 mg (not administered)  oxyCODONE-acetaminophen (PERCOCET/ROXICET) 5-325 MG per tablet 1 tablet (1 tablet Oral Given 07/06/17 0211)     Initial Impression / Assessment and Plan / ED Course  I have reviewed the triage vital signs and the nursing notes.  Pertinent labs & imaging results that were available during my care of the patient were reviewed by me and considered in my medical decision making (see chart for details).     Patient complaining of back pain after a fall.  Fall occurred several days ago.  She initially did not have much pain,  but the pain has worsened.  She reports that when she is up and moving around during the day the pain eases off considerably, but then when she lies down and tries to sleep at night the pain worsens.  Pain is nonradicular.  She has no focal neurologic deficits.  X-ray of lumbar spine was normal.  Patient reassured, symptoms are muscular in nature.  Continue rest, warm compresses, muscle relaxer, analgesia.  Final Clinical Impressions(s) / ED Diagnoses   Final diagnoses:  Lumbar strain, initial encounter    ED Discharge Orders        Ordered    oxyCODONE-acetaminophen (PERCOCET) 5-325 MG tablet  Every 4 hours PRN     07/06/17  0315    ibuprofen (ADVIL,MOTRIN) 800 MG tablet  3 times daily     07/06/17 0316    diazepam (VALIUM) 5 MG tablet  Every 8 hours PRN     07/06/17 0316       Gilda Crease, MD 07/06/17 [phone]

## 2017-07-07 MED FILL — Hydrocodone-Acetaminophen Tab 5-325 MG: ORAL | Qty: 6 | Status: AC

## 2017-11-27 ENCOUNTER — Telehealth: Payer: Self-pay | Admitting: *Deleted

## 2017-11-27 NOTE — Telephone Encounter (Signed)
Erroneous encounter

## 2018-08-08 ENCOUNTER — Emergency Department (HOSPITAL_COMMUNITY): Payer: BC Managed Care – PPO

## 2018-08-08 ENCOUNTER — Encounter (HOSPITAL_COMMUNITY): Payer: Self-pay | Admitting: *Deleted

## 2018-08-08 ENCOUNTER — Other Ambulatory Visit: Payer: Self-pay

## 2018-08-08 ENCOUNTER — Emergency Department (HOSPITAL_COMMUNITY)
Admission: EM | Admit: 2018-08-08 | Discharge: 2018-08-08 | Disposition: A | Discharge status: Home / Self Care | Payer: BC Managed Care – PPO | Attending: Emergency Medicine | Admitting: Emergency Medicine

## 2018-08-08 DIAGNOSIS — Z79899 Other long term (current) drug therapy: Secondary | ICD-10-CM | POA: Insufficient documentation

## 2018-08-08 DIAGNOSIS — R6889 Other general symptoms and signs: Secondary | ICD-10-CM

## 2018-08-08 DIAGNOSIS — J111 Influenza due to unidentified influenza virus with other respiratory manifestations: Secondary | ICD-10-CM | POA: Insufficient documentation

## 2018-08-08 DIAGNOSIS — R509 Fever, unspecified: Secondary | ICD-10-CM | POA: Diagnosis present

## 2018-08-08 DIAGNOSIS — Z91010 Allergy to peanuts: Secondary | ICD-10-CM | POA: Diagnosis not present

## 2018-08-08 MED ORDER — BENZONATATE 100 MG PO CAPS
200.0000 mg | ORAL_CAPSULE | Freq: Once | ORAL | Status: AC
  Administered 2018-08-08: 200 mg via ORAL
  Filled 2018-08-08: qty 2

## 2018-08-08 MED ORDER — IBUPROFEN 400 MG PO TABS
600.0000 mg | ORAL_TABLET | Freq: Once | ORAL | Status: DC
  Filled 2018-08-08: qty 2

## 2018-08-08 MED ORDER — OSELTAMIVIR PHOSPHATE 75 MG PO CAPS: 75.0000 mg | ORAL_CAPSULE | Freq: Two times a day (BID) | ORAL | 0 refills | Status: DC

## 2018-08-08 NOTE — ED Triage Notes (Signed)
Sore throat, headache, feeling weak , onset yesterday

## 2018-08-18 NOTE — ED Provider Notes (Signed)
[facility] EMERGENCY DEPARTMENT Provider Note   CSN: [id number] Arrival date & time: 08/08/18  1036    History   Chief Complaint Chief Complaint  Patient presents with  . Sore Throat  . Headache    HPI Heather Cook is a 58 y.o. female.   HPI   58 year old female with subjective fever, generalized weakness, headache, sore throat.  Onset yesterday.  Persistent since then.  She feels like she has no energy.  Mild nausea.  No vomiting.  No urinary complaints.  No sick contacts.  No rash.  Past Medical History:  Diagnosis Date  . GERD (gastroesophageal reflux disease)   . History of stomach ulcers   . Sciatica     There are no active problems to display for this patient.   Past Surgical History:  Procedure Laterality Date  . TUBAL LIGATION       OB History    Gravida  3   Para  2   Term  2   Preterm      AB  1   Living        SAB  1   TAB      Ectopic      Multiple      Live Births               Home Medications    Prior to Admission medications   Medication Sig Start Date End Date Taking? Authorizing Provider  acetaminophen (TYLENOL) 500 MG tablet Take 500 mg by mouth every 6 (six) hours as needed for mild pain or moderate pain.    [provider]  diazepam (VALIUM) 5 MG tablet Take 1 tablet (5 mg total) by mouth every 8 (eight) hours as needed for muscle spasms. 07/06/17   Gilda Crease, MD  HYDROcodone-acetaminophen (NORCO/VICODIN) 5-325 MG tablet Take 1-2 tablets by mouth every 4 (four) hours as needed. 07/06/17   Gilda Crease, MD  ibuprofen (ADVIL,MOTRIN) 800 MG tablet Take 1 tablet (800 mg total) by mouth 3 (three) times daily. 07/06/17   Gilda Crease, MD  oseltamivir (TAMIFLU) 75 MG capsule Take 1 capsule (75 mg total) by mouth every 12 (twelve) hours. 08/08/18   Raeford Razor, MD    Family History Family History  Adopted: Yes  Family history unknown: Yes    Social History Social History    Tobacco Use  . Smoking status: Never Smoker  . Smokeless tobacco: Never Used  Substance Use Topics  . Alcohol use: No  . Drug use: No     Allergies   Other; Almond oil; Morphine and related; Peanuts [peanut oil]; Penicillins; Strawberry extract; and Tramadol   Review of Systems Review of Systems  All systems reviewed and negative, other than as noted in HPI.  Physical Exam Updated Vital Signs BP 117/61   Pulse 98   Temp 98.6 F (37 C) (Oral)   Resp 16   Ht 5\' 4"  (1.626 m)   Wt 90.7 kg   LMP 08/12/2014 (Exact Date)   SpO2 100%   BMI 34.33 kg/m   Physical Exam Vitals signs and nursing note reviewed.  Constitutional:      General: She is not in acute distress.    Appearance: She is well-developed.  HENT:     Head: Normocephalic and atraumatic.  Eyes:     General:        Right eye: No discharge.        Left eye: No discharge.  Conjunctiva/sclera: Conjunctivae normal.  Neck:     Musculoskeletal: Neck supple.  Cardiovascular:     Rate and Rhythm: Normal rate and regular rhythm.     Heart sounds: Normal heart sounds. No murmur. No friction rub. No gallop.   Pulmonary:     Effort: Pulmonary effort is normal. No respiratory distress.     Breath sounds: Normal breath sounds.  Abdominal:     General: There is no distension.     Palpations: Abdomen is soft.     Tenderness: There is no abdominal tenderness.  Musculoskeletal:        General: No tenderness.  Skin:    General: Skin is warm and dry.  Neurological:     Mental Status: She is alert.  Psychiatric:        Behavior: Behavior normal.        Thought Content: Thought content normal.      ED Treatments / Results  Labs (all labs ordered are listed, but only abnormal results are displayed) Labs Reviewed - No data to display  EKG None  Radiology No results found.  Procedures Procedures (including critical care time)  Medications Ordered in ED Medications  benzonatate (TESSALON) capsule 200  mg (200 mg Oral Given 08/08/18 1421)     Initial Impression / Assessment and Plan / ED Course  I have reviewed the triage vital signs and the nursing notes.  Pertinent labs & imaging results that were available during my care of the patient were reviewed by me and considered in my medical decision making (see chart for details).       58 year old female with likely influenza.  Feel she is appropriate for outpatient treatment.  Symptom onset less than 48 hours ago.  Provided prescription for Tamiflu.  Return precautions discussed.  Symptomatic treatment otherwise.  Plenty of rest, NSAIDs as needed for fever and aches/pains.  Drink plenty of fluids.  Final Clinical Impressions(s) / ED Diagnoses   Final diagnoses:  Flu-like symptoms    ED Discharge Orders         Ordered    oseltamivir (TAMIFLU) 75 MG capsule  Every 12 hours     08/08/18 1504           Raeford Razor, MD 08/18/18 1814

## 2020-09-06 ENCOUNTER — Other Ambulatory Visit: Payer: Self-pay

## 2020-09-06 ENCOUNTER — Emergency Department (HOSPITAL_COMMUNITY)
Admission: EM | Admit: 2020-09-06 | Discharge: 2020-09-06 | Disposition: A | Discharge status: Home / Self Care | Payer: BC Managed Care – PPO | Attending: Emergency Medicine | Admitting: Emergency Medicine

## 2020-09-06 ENCOUNTER — Encounter (HOSPITAL_COMMUNITY): Payer: Self-pay | Admitting: Emergency Medicine

## 2020-09-06 ENCOUNTER — Emergency Department (HOSPITAL_COMMUNITY): Payer: BC Managed Care – PPO

## 2020-09-06 DIAGNOSIS — K5909 Other constipation: Secondary | ICD-10-CM | POA: Diagnosis not present

## 2020-09-06 DIAGNOSIS — R1032 Left lower quadrant pain: Secondary | ICD-10-CM | POA: Diagnosis present

## 2020-09-06 DIAGNOSIS — R739 Hyperglycemia, unspecified: Secondary | ICD-10-CM

## 2020-09-06 DIAGNOSIS — E1165 Type 2 diabetes mellitus with hyperglycemia: Secondary | ICD-10-CM | POA: Insufficient documentation

## 2020-09-06 DIAGNOSIS — R300 Dysuria: Secondary | ICD-10-CM | POA: Insufficient documentation

## 2020-09-06 DIAGNOSIS — Z91010 Allergy to peanuts: Secondary | ICD-10-CM | POA: Insufficient documentation

## 2020-09-06 HISTORY — DX: Other specified bacterial intestinal infections: A04.8

## 2020-09-06 HISTORY — DX: Type 2 diabetes mellitus without complications: E11.9

## 2020-09-06 LAB — COMPREHENSIVE METABOLIC PANEL
ALT: 24 U/L (ref 0–44)
AST: 18 U/L (ref 15–41)
Albumin: 4 g/dL (ref 3.5–5.0)
Alkaline Phosphatase: 86 U/L (ref 38–126)
Anion gap: 9 (ref 5–15)
BUN: 9 mg/dL (ref 6–20)
CO2: 28 mmol/L (ref 22–32)
Calcium: 9.1 mg/dL (ref 8.9–10.3)
Chloride: 97 mmol/L — ABNORMAL LOW (ref 98–111)
Creatinine, Ser: 0.77 mg/dL (ref 0.44–1.00)
GFR, Estimated: >60 mL/min (ref >60)
Glucose, Bld: 342 mg/dL — ABNORMAL HIGH (ref 70–99)
Potassium: 4 mmol/L (ref 3.5–5.1)
Sodium: 134 mmol/L — ABNORMAL LOW (ref 135–145)
Total Bilirubin: 0.5 mg/dL (ref 0.3–1.2)
Total Protein: 7.5 g/dL (ref 6.5–8.1)

## 2020-09-06 LAB — URINALYSIS, ROUTINE W REFLEX MICROSCOPIC
Bilirubin Urine: NEGATIVE
Glucose, UA: 250 mg/dL — AB
Hgb urine dipstick: NEGATIVE
Ketones, ur: NEGATIVE mg/dL
Leukocytes,Ua: NEGATIVE
Nitrite: NEGATIVE
Protein, ur: NEGATIVE mg/dL
Specific Gravity, Urine: >1.03 — ABNORMAL HIGH (ref 1.005–1.030)
pH: 5.5 (ref 5.0–8.0)

## 2020-09-06 LAB — CBC WITH DIFFERENTIAL/PLATELET
Abs Immature Granulocytes: 0.02 10*3/uL (ref 0.00–0.07)
Basophils Absolute: 0.1 10*3/uL (ref 0.0–0.1)
Basophils Relative: 1 %
Eosinophils Absolute: 0.2 10*3/uL (ref 0.0–0.5)
Eosinophils Relative: 3 %
HCT: 44.1 % (ref 36.0–46.0)
Hemoglobin: 14 g/dL (ref 12.0–15.0)
Immature Granulocytes: 0 %
Lymphocytes Relative: 37 %
Lymphs Abs: 2.6 10*3/uL (ref 0.7–4.0)
MCH: 26.9 pg (ref 26.0–34.0)
MCHC: 31.7 g/dL (ref 30.0–36.0)
MCV: 84.8 fL (ref 80.0–100.0)
Monocytes Absolute: 0.5 10*3/uL (ref 0.1–1.0)
Monocytes Relative: 7 %
Neutro Abs: 3.7 10*3/uL (ref 1.7–7.7)
Neutrophils Relative %: 52 %
Platelets: ADEQUATE 10*3/uL (ref 150–400)
RBC: 5.2 MIL/uL — ABNORMAL HIGH (ref 3.87–5.11)
RDW: 12.9 % (ref 11.5–15.5)
WBC: 7.1 10*3/uL (ref 4.0–10.5)
nRBC: 0 % (ref 0.0–0.2)

## 2020-09-06 LAB — LIPASE, BLOOD: Lipase: 38 U/L (ref 11–51)

## 2020-09-06 LAB — CBG MONITORING, ED: Glucose-Capillary: 334 mg/dL — ABNORMAL HIGH (ref 70–99)

## 2020-09-06 MED ORDER — NITROFURANTOIN MONOHYD MACRO 100 MG PO CAPS
100.0000 mg | ORAL_CAPSULE | Freq: Once | ORAL | Status: AC
  Administered 2020-09-06: 100 mg via ORAL
  Filled 2020-09-06: qty 1

## 2020-09-06 MED ORDER — NITROFURANTOIN MONOHYD MACRO 100 MG PO CAPS: 100.0000 mg | ORAL_CAPSULE | Freq: Two times a day (BID) | ORAL | 0 refills | Status: DC

## 2020-09-06 MED ORDER — IOHEXOL 300 MG/ML  SOLN
100.0000 mL | Freq: Once | INTRAMUSCULAR | Status: AC | PRN
  Administered 2020-09-06: 100 mL via INTRAVENOUS

## 2020-09-06 MED ORDER — ACETAMINOPHEN 325 MG PO TABS
650.0000 mg | ORAL_TABLET | Freq: Once | ORAL | Status: AC
  Administered 2020-09-06: 650 mg via ORAL
  Filled 2020-09-06: qty 2

## 2020-09-06 NOTE — ED Notes (Addendum)
CT called and stated when they flushed pt's IV the second time it appeared infiltrated. Second nurse went to CT to place IV. Unable to obtain IV access after two additional attempts and after using [name] machine. MD made aware, orders modified.

## 2020-09-06 NOTE — ED Provider Notes (Addendum)
MSE was initiated and I personally evaluated the patient and placed orders (if any) at  1:46 PM on September 06, 2020.  Patient is dysuria as well as vaginal irritation for the past 4 days.  Consistent with prior UTI symptoms.  Reports chronic left abdominal pain that has not acutely worsened.  States that "she felt as if something was moving in there yesterday" and was concerned.  Also reports chronic intermittent lightheadedness as well as headaches.  She states she was diagnosed with diabetes recently and started on Metformin which she discontinued due to the diarrhea.  She states she is having chronic polydipsia and polyuria.  The patient appears stable so that the remainder of the MSE may be completed by another provider.   Placido Sou, PA-C 09/06/20 1346    Placido Sou, PA-C 09/06/20 1352    Bethann Berkshire, MD 09/08/20 [phone]

## 2020-09-06 NOTE — ED Notes (Signed)
Gold and green lab tube redrawn and sent to lab.

## 2020-09-06 NOTE — Discharge Instructions (Addendum)
  You have voiced concern for UTI.  Please take all of your antibiotics until finished!   You may develop abdominal discomfort or diarrhea from the antibiotic.  You may help offset this with probiotics which you can buy or get in yogurt. Do not eat or take the probiotics until 2 hours after your antibiotic.   Constipation  You have evidence of constipation. Please follow the instructions below:  Hydration: You should start drinking at least ten, 8 oz glasses of water a day to help relieve your constipation. Baseline hydration should be at least eight, 8 oz glasses of water a day. This is a daily amount that you should be drinking, even without your current issue. Proper hydration not only helps prevent constipation, it is essential for any of the following treatments to be effective.  Fiber: Begin taking the fiber supplement daily. You should also increase the fiber in your diet.  MiraLAX: You may begin taking MiraLAX daily until you are having at least 1 soft bowel movement a day.  Follow-up: Follow-up with your primary care provider as soon as possible for continued management of this issue.  May also follow-up with gastroenterology.  Return: Return to the ED should any symptoms worsen.  For prescription assistance, may try using prescription discount sites or apps, such as goodrx.com

## 2020-09-06 NOTE — ED Provider Notes (Signed)
[facility] EMERGENCY DEPARTMENT Provider Note   CSN: [id number] Arrival date & time: 09/06/20  1259     History Chief Complaint  Patient presents with  . Abdominal Pain    Heather Cook is a 60 y.o. female.  HPI      Heather Cook is a 60 y.o. female, with a history of DM, GERD, ulcer, presenting to the ED with abdominal pain. Patient endorses left flank and left lower quadrant abdominal pain, intermittent over the last 2 weeks, but more constant over the last couple days.  She states she is concerned about "cancer or worms." She also endorses dysuria over the last couple days.   She frequently has constipation.  Last bowel movement was yesterday and was hard. She also endorses a bilateral frontal headache, consistent with previous headaches, requesting Tylenol, which is usually what she takes for her headaches. Patient also states she was newly diagnosed with diabetes a few weeks ago at an urgent care.  She was started on Metformin, however, she has stopped taking this because she states it gave her diarrhea. Denies fever/chills, chest pain, shortness of breath, syncope, hematuria, diarrhea, hematochezia/melena, back pain, or any other complaints.   Past Medical History:  Diagnosis Date  . Diabetes mellitus without complication (HCC)   . GERD (gastroesophageal reflux disease)   . H. pylori infection   . History of stomach ulcers   . Sciatica     There are no problems to display for this patient.   Past Surgical History:  Procedure Laterality Date  . TUBAL LIGATION       OB History    Gravida  3   Para  2   Term  2   Preterm      AB  1   Living        SAB  1   IAB      Ectopic      Multiple      Live Births              Family History  Adopted: Yes  Family history unknown: Yes    Social History   Tobacco Use  . Smoking status: Never Smoker  . Smokeless tobacco: Never Used  Substance Use Topics  . Alcohol use: No  . Drug use: No     Home Medications Prior to Admission medications   Medication Sig Start Date End Date Taking? Authorizing Provider  acetaminophen (TYLENOL) 500 MG tablet Take 500 mg by mouth every 6 (six) hours as needed for mild pain or moderate pain.   Yes [provider]  nitrofurantoin, macrocrystal-monohydrate, (MACROBID) 100 MG capsule Take 1 capsule (100 mg total) by mouth 2 (two) times daily. 09/06/20  Yes Branston Halsted C, PA-C  diazepam (VALIUM) 5 MG tablet Take 1 tablet (5 mg total) by mouth every 8 (eight) hours as needed for muscle spasms. 07/06/17   Gilda Crease, MD  HYDROcodone-acetaminophen (NORCO/VICODIN) 5-325 MG tablet Take 1-2 tablets by mouth every 4 (four) hours as needed. 07/06/17   Gilda Crease, MD  ibuprofen (ADVIL,MOTRIN) 800 MG tablet Take 1 tablet (800 mg total) by mouth 3 (three) times daily. 07/06/17   Gilda Crease, MD  oseltamivir (TAMIFLU) 75 MG capsule Take 1 capsule (75 mg total) by mouth every 12 (twelve) hours. 08/08/18   Raeford Razor, MD    Allergies    Penicillins, Almond oil, Peanuts [peanut oil], Morphine and related, Strawberry extract, and Tramadol  Review of Systems  Review of Systems  Constitutional: Negative for chills, diaphoresis and fever.  Respiratory: Negative for shortness of breath.   Cardiovascular: Negative for chest pain.  Gastrointestinal: Positive for abdominal pain and constipation. Negative for blood in stool, diarrhea, nausea and vomiting.  Genitourinary: Positive for dysuria and flank pain. Negative for frequency, vaginal bleeding and vaginal discharge.  Musculoskeletal: Negative for back pain.  Neurological: Negative for syncope, weakness and numbness.  All other systems reviewed and are negative.   Physical Exam Updated Vital Signs BP 138/89 (BP Location: Right Arm)   Pulse 90   Temp 98.3 F (36.8 C) (Oral)   Resp 16   Ht 5\' 3"  (1.6 m)   Wt 90.7 kg   LMP 08/12/2014 (Exact Date)   SpO2 100%   BMI  35.43 kg/m   Physical Exam Vitals and nursing note reviewed.  Constitutional:      General: She is not in acute distress.    Appearance: She is well-developed. She is not diaphoretic.  HENT:     Head: Normocephalic and atraumatic.     Mouth/Throat:     Mouth: Mucous membranes are moist.     Pharynx: Oropharynx is clear.  Eyes:     Conjunctiva/sclera: Conjunctivae normal.  Cardiovascular:     Rate and Rhythm: Normal rate and regular rhythm.     Pulses: Normal pulses.          Radial pulses are 2+ on the right side and 2+ on the left side.       Posterior tibial pulses are 2+ on the right side and 2+ on the left side.     Heart sounds: Normal heart sounds.     Comments: Tactile temperature in the extremities appropriate and equal bilaterally. Pulmonary:     Effort: Pulmonary effort is normal. No respiratory distress.     Breath sounds: Normal breath sounds.  Abdominal:     Palpations: Abdomen is soft.     Tenderness: There is abdominal tenderness. There is no right CVA tenderness, left CVA tenderness or guarding.    Musculoskeletal:     Cervical back: Neck supple.     Right lower leg: No edema.     Left lower leg: No edema.     Comments: No tenderness, swelling, deformity, instability to the left hip.  No pain with range of motion of the left hip.  Lymphadenopathy:     Cervical: No cervical adenopathy.  Skin:    General: Skin is warm and dry.  Neurological:     Mental Status: She is alert.  Psychiatric:        Mood and Affect: Mood and affect normal.        Speech: Speech normal.        Behavior: Behavior normal.     ED Results / Procedures / Treatments   Labs (all labs ordered are listed, but only abnormal results are displayed) Labs Reviewed  URINALYSIS, ROUTINE W REFLEX MICROSCOPIC - Abnormal; Notable for the following components:      Result Value   APPearance HAZY (*)    Specific Gravity, Urine >1.030 (*)    Glucose, UA 250 (*)    All other components  within normal limits  CBC WITH DIFFERENTIAL/PLATELET - Abnormal; Notable for the following components:   RBC 5.20 (*)    All other components within normal limits  COMPREHENSIVE METABOLIC PANEL - Abnormal; Notable for the following components:   Sodium 134 (*)    Chloride 97 (*)  Glucose, Bld 342 (*)    All other components within normal limits  CBG MONITORING, ED - Abnormal; Notable for the following components:   Glucose-Capillary 334 (*)    All other components within normal limits  URINE CULTURE  LIPASE, BLOOD    EKG None  Radiology CT ABDOMEN PELVIS WO CONTRAST  Result Date: 09/06/2020 CLINICAL DATA:  Left lower quadrant abdominal pain, recurrent urinary tract infection EXAM: CT ABDOMEN AND PELVIS WITHOUT CONTRAST TECHNIQUE: Multidetector CT imaging of the abdomen and pelvis was performed following the standard protocol without IV contrast. COMPARISON:  03/13/2017 FINDINGS: Lower chest: The visualized lung bases are clear. The visualized heart and pericardium are unremarkable. Hepatobiliary: Moderate hepatic steatosis, similar to that noted on prior examination. Scattered areas of focal fatty sparing noted. No definite intrahepatic mass lesion identified. No intra or extrahepatic biliary ductal dilation. Gallbladder unremarkable. Pancreas: Unremarkable Spleen: Unremarkable Adrenals/Urinary Tract: Adrenal glands are unremarkable. Kidneys are normal, without renal calculi, focal lesion, or hydronephrosis. Bladder is unremarkable. Stomach/Bowel: Moderate stool seen throughout the colon without evidence of obstruction. The stomach, small bowel, and large bowel are otherwise unremarkable. Appendix normal. No free intraperitoneal gas or fluid. Vascular/Lymphatic: Minimal atherosclerotic calcification within the left common iliac artery. The abdominal vasculature is otherwise unremarkable on this noncontrast examination. No pathologic adenopathy within the abdomen and pelvis. Reproductive:  Uterus and bilateral adnexa are unremarkable. Other: Tiny broad-based fat containing umbilical hernia. The rectum is unremarkable. Musculoskeletal: The osseous structures are age-appropriate. No lytic or blastic bone lesions are seen. No acute bone abnormality. IMPRESSION: No acute intra-abdominal pathology identified. Moderate stool without evidence of obstruction. Moderate hepatic steatosis. Aortic Atherosclerosis (ICD10-I70.0). Electronically Signed   By: Helyn Numbers MD   On: 09/06/2020 23:04    Procedures Procedures   Medications Ordered in ED Medications  acetaminophen (TYLENOL) tablet 650 mg (650 mg Oral Given 09/06/20 1925)  iohexol (OMNIPAQUE) 300 MG/ML solution 100 mL (100 mLs Intravenous Contrast Given 09/06/20 2221)  nitrofurantoin (macrocrystal-monohydrate) (MACROBID) capsule 100 mg (100 mg Oral Given 09/06/20 2345)    ED Course  I have reviewed the triage vital signs and the nursing notes.  Pertinent labs & imaging results that were available during my care of the patient were reviewed by me and considered in my medical decision making (see chart for details).    MDM Rules/Calculators/A&P                          Patient presents with intermittent abdominal pain for the past couple weeks. Patient is nontoxic appearing, afebrile, not tachycardic, not tachypneic, not hypotensive, maintains excellent SPO2 on room air, and is in no apparent distress.   I have reviewed the patient's chart to obtain more information.   I reviewed and interpreted the patient's labs and radiological studies. No leukocytosis. Subsequent abdominal exams benign. Evidence of constipation on CT without other acute abnormalities. Since patient states she has symptoms consistent with previous UTIs, we will treat her as such. The patient was given instructions for home care as well as return precautions. Patient voices understanding of these instructions, accepts the plan, and is comfortable with  discharge.    I had a frank conversation with the patient regarding diabetes and the risks of allowing her diabetes to go untreated.  She does have an assigned PCP, however, she has not tried to make contact or an appointment with this person.   Final Clinical Impression(s) / ED Diagnoses Final diagnoses:  Other  constipation  Hyperglycemia    Rx / DC Orders ED Discharge Orders         Ordered    nitrofurantoin, macrocrystal-monohydrate, (MACROBID) 100 MG capsule  2 times daily        09/06/20 2331           Anselm Pancoast, PA-C 09/06/20 2354    Terald Sleeper, MD 09/07/20 1021

## 2020-09-06 NOTE — ED Triage Notes (Signed)
Pt c/o lower abdominal pain for the past two wks.  Pt states she may have a recurring UTI.

## 2020-09-08 LAB — URINE CULTURE: Culture: >=100000 — AB

## 2020-09-09 ENCOUNTER — Telehealth: Payer: Self-pay | Admitting: *Deleted

## 2020-09-09 NOTE — Telephone Encounter (Signed)
Post ED Visit - Positive Culture Follow-up  Culture report reviewed by antimicrobial stewardship pharmacist: [facility] []  , Pharm.D. []  Enzo Bi, Pharm.D., BCPS AQ-ID []  , Pharm.D., BCPS []  Celedonio Miyamoto, Pharm.D., BCPS []  [place], Garvin Fila.D., BCPS, AAHIVP []  , Pharm.D., BCPS, AAHIVP []  Georgina Pillion, PharmD, BCPS []  , PharmD, BCPS []  [place], PharmD, BCPS []  [address], PharmD []  , PharmD, BCPS []  Estella Husk, PharmD  Pharmacy Team []  Lysle Pearl, PharmD []  , PharmD []  Phillips Climes, PharmD []  , Rph []  Agapito Games) , PharmD []  Verlan Friends, PharmD []  , PharmD []  Mervyn Gay, PharmD []  , PharmD []  Vinnie Level, PharmD []  [facility], PharmD []  , PharmD []  Len Childs, PharmD   Positive urine culture Treated with Nitrofurantoin Monohyd Macro, organism sensitive to the same and no further patient follow-up is required at this time. , Pharm  Greer Pickerel [place] 09/09/2020, 10:26 AM

## 2021-03-02 ENCOUNTER — Encounter (HOSPITAL_COMMUNITY): Payer: Self-pay

## 2021-03-02 ENCOUNTER — Other Ambulatory Visit: Payer: Self-pay

## 2021-03-02 ENCOUNTER — Emergency Department (HOSPITAL_COMMUNITY)
Admission: EM | Admit: 2021-03-02 | Discharge: 2021-03-03 | Disposition: A | Discharge status: Home / Self Care | Payer: BC Managed Care – PPO | Attending: Emergency Medicine | Admitting: Emergency Medicine

## 2021-03-02 DIAGNOSIS — Z20822 Contact with and (suspected) exposure to covid-19: Secondary | ICD-10-CM | POA: Insufficient documentation

## 2021-03-02 DIAGNOSIS — E119 Type 2 diabetes mellitus without complications: Secondary | ICD-10-CM | POA: Insufficient documentation

## 2021-03-02 DIAGNOSIS — Z91010 Allergy to peanuts: Secondary | ICD-10-CM | POA: Insufficient documentation

## 2021-03-02 DIAGNOSIS — J029 Acute pharyngitis, unspecified: Secondary | ICD-10-CM | POA: Insufficient documentation

## 2021-03-02 LAB — GROUP A STREP BY PCR: Group A Strep by PCR: NOT DETECTED

## 2021-03-02 LAB — RESP PANEL BY RT-PCR (FLU A&B, COVID) ARPGX2
Influenza A by PCR: NEGATIVE
Influenza B by PCR: NEGATIVE
SARS Coronavirus 2 by RT PCR: NEGATIVE

## 2021-03-02 MED ORDER — AZITHROMYCIN 250 MG PO TABS: 250.0000 mg | ORAL_TABLET | Freq: Every day | ORAL | 0 refills | Status: DC

## 2021-03-02 MED ORDER — PREDNISONE 10 MG PO TABS: 20.0000 mg | ORAL_TABLET | Freq: Two times a day (BID) | ORAL | 0 refills | Status: DC

## 2021-03-02 MED ORDER — PREDNISONE 20 MG PO TABS
20.0000 mg | ORAL_TABLET | Freq: Once | ORAL | Status: AC
  Administered 2021-03-03: 20 mg via ORAL
  Filled 2021-03-02: qty 1

## 2021-03-02 NOTE — ED Provider Notes (Signed)
[facility] EMERGENCY DEPARTMENT Provider Note   CSN: [id number] Arrival date & time: 03/02/21  1941     History Chief Complaint  Patient presents with   Sore Throat    Heather Cook is a 60 y.o. female.  Patient is a 60 year old female with past medical history of diabetes and GERD.  Patient presenting today for evaluation of sore throat.  This has been worsening over the past several days.  She denies any fevers or chills.  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  She denies any cough.  She reports an episode in the past where her throat nearly "closed up".  She was concerned this was occurring again this evening.  The history is provided by the patient.  Sore Throat This is a new problem. The current episode started 2 days ago. The problem occurs constantly. The problem has been gradually worsening. The symptoms are aggravated by swallowing. Nothing relieves the symptoms. She has tried nothing for the symptoms.      Past Medical History:  Diagnosis Date   Diabetes mellitus without complication (HCC)    GERD (gastroesophageal reflux disease)    H. pylori infection    History of stomach ulcers    Sciatica     There are no problems to display for this patient.   Past Surgical History:  Procedure Laterality Date   TUBAL LIGATION       OB History     Gravida  3   Para  2   Term  2   Preterm      AB  1   Living         SAB  1   IAB      Ectopic      Multiple      Live Births              Family History  Adopted: Yes  Family history unknown: Yes    Social History   Tobacco Use   Smoking status: Never   Smokeless tobacco: Never  Substance Use Topics   Alcohol use: No   Drug use: No    Home Medications Prior to Admission medications   Medication Sig Start Date End Date Taking? Authorizing Provider  acetaminophen (TYLENOL) 500 MG tablet Take 500 mg by  mouth every 6 (six) hours as needed for mild pain or moderate pain.    [provider]  diazepam (VALIUM) 5 MG tablet Take 1 tablet (5 mg total) by mouth every 8 (eight) hours as needed for muscle spasms. 07/06/17   Gilda Crease, MD  HYDROcodone-acetaminophen (NORCO/VICODIN) 5-325 MG tablet Take 1-2 tablets by mouth every 4 (four) hours as needed. 07/06/17   Gilda Crease, MD  ibuprofen (ADVIL,MOTRIN) 800 MG tablet Take 1 tablet (800 mg total) by mouth 3 (three) times daily. 07/06/17   Gilda Crease, MD  nitrofurantoin, macrocrystal-monohydrate, (MACROBID) 100 MG capsule Take 1 capsule (100 mg total) by mouth 2 (two) times daily. 09/06/20   Joy, Shawn C, PA-C  oseltamivir (TAMIFLU) 75 MG capsule Take 1 capsule (75 mg total) by mouth every 12 (twelve) hours. 08/08/18   Raeford Razor, MD    Allergies    Penicillins, Almond oil, Peanuts [peanut oil], Morphine and related, Strawberry extract, and Tramadol  Review of Systems   Review of Systems  All other systems reviewed and are negative.  Physical Exam Updated Vital Signs  BP 139/78 (BP Location: Right Arm)   Pulse 87   Temp 99 F (37.2 C) (Oral)   Resp 20   Ht 5\' 3"  (1.6 m)   Wt 86.2 kg   LMP 08/12/2014 (Exact Date)   SpO2 100%   BMI 33.66 kg/m   Physical Exam Vitals and nursing note reviewed.  Constitutional:      General: She is not in acute distress.    Appearance: She is well-developed. She is not diaphoretic.  HENT:     Head: Normocephalic and atraumatic.     Mouth/Throat:     Mouth: Mucous membranes are moist.     Pharynx: Uvula midline. Pharyngeal swelling and posterior oropharyngeal erythema present. No oropharyngeal exudate.  Cardiovascular:     Rate and Rhythm: Normal rate and regular rhythm.     Heart sounds: No murmur heard.   No friction rub. No gallop.  Pulmonary:     Effort: Pulmonary effort is normal. No respiratory distress.     Breath sounds: Normal breath sounds. No wheezing.   Abdominal:     General: Bowel sounds are normal. There is no distension.     Palpations: Abdomen is soft.     Tenderness: There is no abdominal tenderness.  Musculoskeletal:        General: Normal range of motion.     Cervical back: Normal range of motion and neck supple.  Skin:    General: Skin is warm and dry.  Neurological:     General: No focal deficit present.     Mental Status: She is alert and oriented to person, place, and time.    ED Results / Procedures / Treatments   Labs (all labs ordered are listed, but only abnormal results are displayed) Labs Reviewed  GROUP A STREP BY PCR  RESP PANEL BY RT-PCR (FLU A&B, COVID) [id number]    EKG None  Radiology No results found.  Procedures Procedures   Medications Ordered in ED Medications  predniSONE (DELTASONE) tablet 20 mg (has no administration in time range)    ED Course  I have reviewed the triage vital signs and the nursing notes.  Pertinent labs & imaging results that were available during my care of the patient were reviewed by me and considered in my medical decision making (see chart for details).    MDM Rules/Calculators/A&P  Patient presenting here with complaints of sore throat as described in the HPI.  Both COVID and strep tests are negative.  Symptoms are most likely viral in nature.  Patient will be treated with prednisone and follow-up as needed.  She will also be provided with a rx for keflex if not improving with prednisone for what looks like tonsillitis.  Final Clinical Impression(s) / ED Diagnoses Final diagnoses:  None    Rx / DC Orders ED Discharge Orders     None        08/14/2014, MD 03/02/21 2336

## 2021-03-02 NOTE — Discharge Instructions (Addendum)
Begin taking prednisone as prescribed.  If not feeling better in the next 24 to 48 hours, fill the prescription for Zithromax you were provided this evening.  Return to the emergency department if symptoms significantly worsen or change.

## 2021-03-02 NOTE — ED Triage Notes (Addendum)
Pt. States their throat has been sore since yesterday. Pt. States today it feels like it is closing up. Pt. States it hurts to swallow.

## 2021-07-30 ENCOUNTER — Encounter (HOSPITAL_COMMUNITY): Payer: Self-pay

## 2021-07-30 ENCOUNTER — Emergency Department (HOSPITAL_COMMUNITY)
Admission: EM | Admit: 2021-07-30 | Discharge: 2021-07-30 | Disposition: A | Discharge status: Home / Self Care | Payer: No Typology Code available for payment source | Attending: Emergency Medicine | Admitting: Emergency Medicine

## 2021-07-30 DIAGNOSIS — R42 Dizziness and giddiness: Secondary | ICD-10-CM | POA: Diagnosis present

## 2021-07-30 DIAGNOSIS — R110 Nausea: Secondary | ICD-10-CM | POA: Diagnosis not present

## 2021-07-30 DIAGNOSIS — E119 Type 2 diabetes mellitus without complications: Secondary | ICD-10-CM | POA: Insufficient documentation

## 2021-07-30 DIAGNOSIS — Z91010 Allergy to peanuts: Secondary | ICD-10-CM | POA: Insufficient documentation

## 2021-07-30 LAB — CBC
HCT: 41.6 % (ref 36.0–46.0)
Hemoglobin: 13.1 g/dL (ref 12.0–15.0)
MCH: 26.9 pg (ref 26.0–34.0)
MCHC: 31.5 g/dL (ref 30.0–36.0)
MCV: 85.4 fL (ref 80.0–100.0)
Platelets: 225 10*3/uL (ref 150–400)
RBC: 4.87 MIL/uL (ref 3.87–5.11)
RDW: 13.1 % (ref 11.5–15.5)
WBC: 5.9 10*3/uL (ref 4.0–10.5)
nRBC: 0 % (ref 0.0–0.2)

## 2021-07-30 LAB — BASIC METABOLIC PANEL
Anion gap: 8 (ref 5–15)
BUN: 9 mg/dL (ref 8–23)
CO2: 25 mmol/L (ref 22–32)
Calcium: 9.1 mg/dL (ref 8.9–10.3)
Chloride: 100 mmol/L (ref 98–111)
Creatinine, Ser: 0.8 mg/dL (ref 0.44–1.00)
GFR, Estimated: >60 mL/min (ref >60)
Glucose, Bld: 247 mg/dL — ABNORMAL HIGH (ref 70–99)
Potassium: 4.2 mmol/L (ref 3.5–5.1)
Sodium: 133 mmol/L — ABNORMAL LOW (ref 135–145)

## 2021-07-30 MED ORDER — ONDANSETRON HCL 4 MG PO TABS: 4.0000 mg | ORAL_TABLET | Freq: Four times a day (QID) | ORAL | 0 refills | Status: DC

## 2021-07-30 MED ORDER — MECLIZINE HCL 25 MG PO TABS
25.0000 mg | ORAL_TABLET | Freq: Once | ORAL | Status: AC
  Administered 2021-07-30: 25 mg via ORAL
  Filled 2021-07-30: qty 1

## 2021-07-30 NOTE — ED Triage Notes (Signed)
Pt arrived via POV, c/o dizziness and nausea since waking this morning. Denies any SOB or CP. States intermittent LLQ abd pain.  ?

## 2021-07-30 NOTE — ED Provider Triage Note (Signed)
Emergency Medicine Provider Triage Evaluation Note ? ?Heather Cook , a 61 y.o. female  was evaluated in triage.  Pt complains of sudden onset of dizziness this am. ? ?Review of Systems  ?Positive: Worse with head movement ?Negative: Neg neuro sx ? ?Physical Exam  ?BP (!) 120/101 (BP Location: Left Arm)   Pulse 83   Temp 98.1 ?F (36.7 ?C) (Oral)   Resp 18   LMP 08/12/2014 (Exact Date)   SpO2 98%  ?Gen:   Awake, no distress   ?Resp:  Normal effort  ?MSK:   Moves extremities without difficulty  ?Other:   ? ?Medical Decision Making  ?Medically screening exam initiated at 11:42 AM.  Appropriate orders placed.  Heather Cook was informed that the remainder of the evaluation will be completed by another provider, this initial triage assessment does not replace that evaluation, and the importance of remaining in the ED until their evaluation is complete. ? ?Labs and antivert ordered ?  ?Lorre Nick, MD ?07/30/21 1143 ? ?

## 2021-07-30 NOTE — ED Provider Notes (Signed)
?[facility]-EMERGENCY DEPT ?Provider Note ? ? ?CSN: [id number] ?Arrival date & time: 07/30/21  1056 ? ?  ? ?History ? ?Chief Complaint  ?Patient presents with  ? Dizziness  ? Nausea  ? ? ?Heather Cook is a 61 y.o. female. ? ?62 year old female with prior medical history as detailed below who presents for evaluation.  Patient complains of transient dizziness this morning.  Symptoms are worse when she first woke up.  Symptoms now resolved completely.  She denies associated complaint.  She denies fever, visual changes, speech changes, focal weakness, or other complaint. ? ?Patient does report that she has been told that she has diabetes.  She has been prescribed metformin.  She apparently does not feel like she should take metformin. ? ?The history is provided by the patient and medical records.  ?Dizziness ?Quality:  Lightheadedness ?Severity:  Mild ?Onset quality:  Sudden ?Timing:  Rare ?Progression:  Resolved ?Chronicity:  New ? ?  ? ?Home Medications ?Prior to Admission medications   ?Medication Sig Start Date End Date Taking? Authorizing Provider  ?ondansetron (ZOFRAN) 4 MG tablet Take 1 tablet (4 mg total) by mouth every 6 (six) hours. 07/30/21  Yes Wynetta Fines, MD  ?acetaminophen (TYLENOL) 500 MG tablet Take 500 mg by mouth every 6 (six) hours as needed for mild pain or moderate pain.    [provider]  ?azithromycin (ZITHROMAX) 250 MG tablet Take 1 tablet (250 mg total) by mouth daily. Take first 2 tablets together, then 1 every day until finished. 03/02/21   Geoffery Lyons, MD  ?diazepam (VALIUM) 5 MG tablet Take 1 tablet (5 mg total) by mouth every 8 (eight) hours as needed for muscle spasms. 07/06/17   Gilda Crease, MD  ?HYDROcodone-acetaminophen (NORCO/VICODIN) 5-325 MG tablet Take 1-2 tablets by mouth every 4 (four) hours as needed. 07/06/17   Gilda Crease, MD  ?ibuprofen (ADVIL,MOTRIN) 800 MG tablet Take 1 tablet (800 mg total) by mouth 3 (three) times daily.  07/06/17   Gilda Crease, MD  ?nitrofurantoin, macrocrystal-monohydrate, (MACROBID) 100 MG capsule Take 1 capsule (100 mg total) by mouth 2 (two) times daily. 09/06/20   Joy, Shawn C, PA-C  ?oseltamivir (TAMIFLU) 75 MG capsule Take 1 capsule (75 mg total) by mouth every 12 (twelve) hours. 08/08/18   Raeford Razor, MD  ?predniSONE (DELTASONE) 10 MG tablet Take 2 tablets (20 mg total) by mouth 2 (two) times daily. 03/02/21   Geoffery Lyons, MD  ?   ? ?Allergies    ?Penicillins, Almond oil, Peanuts [peanut oil], Morphine and related, Strawberry extract, and Tramadol   ? ?Review of Systems   ?Review of Systems  ?Neurological:  Positive for dizziness.  ?All other systems reviewed and are negative. ? ?Physical Exam ?Updated Vital Signs ?BP (!) 141/76 (BP Location: Left Arm)   Pulse 79   Temp 98.1 ?F (36.7 ?C) (Oral)   Resp 18   LMP 08/12/2014 (Exact Date)   SpO2 100%  ?Physical Exam ?Vitals and nursing note reviewed.  ?Constitutional:   ?   General: She is not in acute distress. ?   Appearance: Normal appearance. She is well-developed.  ?HENT:  ?   Head: Normocephalic and atraumatic.  ?Eyes:  ?   Conjunctiva/sclera: Conjunctivae normal.  ?   Pupils: Pupils are equal, round, and reactive to light.  ?Cardiovascular:  ?   Rate and Rhythm: Normal rate and regular rhythm.  ?   Heart sounds: Normal heart sounds.  ?Pulmonary:  ?  Effort: Pulmonary effort is normal. No respiratory distress.  ?   Breath sounds: Normal breath sounds.  ?Abdominal:  ?   General: There is no distension.  ?   Palpations: Abdomen is soft.  ?   Tenderness: There is no abdominal tenderness.  ?Musculoskeletal:     ?   General: No deformity. Normal range of motion.  ?   Cervical back: Normal range of motion and neck supple.  ?Skin: ?   General: Skin is warm and dry.  ?Neurological:  ?   General: No focal deficit present.  ?   Mental Status: She is alert and oriented to person, place, and time. Mental status is at baseline.  ?   Cranial Nerves: No  cranial nerve deficit.  ?   Sensory: No sensory deficit.  ?   Motor: No weakness.  ?   Coordination: Coordination normal.  ? ? ?ED Results / Procedures / Treatments   ?Labs ?(all labs ordered are listed, but only abnormal results are displayed) ?Labs Reviewed  ?BASIC METABOLIC PANEL - Abnormal; Notable for the following components:  ?    Result Value  ? Sodium 133 (*)   ? Glucose, Bld 247 (*)   ? All other components within normal limits  ?CBC  ? ? ?EKG ?EKG Interpretation ? ?Date/Time:  Friday July 30 2021 11:06:33 EST ?Ventricular Rate:  80 ?PR Interval:  150 ?QRS Duration: 90 ?QT Interval:  415 ?QTC Calculation: 479 ?R Axis:   78 ?Text Interpretation: Sinus rhythm Low voltage, precordial leads Borderline T abnormalities, anterior leads Confirmed by Kristine Royal [phone]) on 07/30/2021 2:17:39 PM ? ?Radiology ?No results found. ? ?Procedures ?Procedures  ? ? ?Medications Ordered in ED ?Medications  ?meclizine (ANTIVERT) tablet 25 mg (25 mg Oral Given 07/30/21 1206)  ? ? ?ED Course/ Medical Decision Making/ A&P ?  ?                        ?Medical Decision Making ?Amount and/or Complexity of Data Reviewed ?Labs: ordered. ? ?Risk ?Prescription drug management. ? ? ? ?Medical Screen Complete ? ?This patient presented to the ED with complaint of transient dizziness. ? ?This complaint involves an extensive number of treatment options. The initial differential diagnosis includes, but is not limited to, hyperglycemia, metabolic abnormality, vertigo, etc. ? ?This presentation is: Acute, Self-Limited, Previously Undiagnosed, Uncertain Prognosis, and Complicated ? ?Patient is presenting with complaint of transient dizziness.  Symptoms were most significant this morning when she got up from sleep and tried to stand. ? ?Symptoms have completely resolved at time of evaluation. ? ?She denies significant concurrent symptoms that are suggestive of more acute pathology. ? ?Patient does have history of diabetes and is currently not  taking metformin as previously prescribed. ? ?Screening labs obtained are without significant abnormality.  Patient is advised to closely follow-up with her regular PCP.  She is advised that treating her diabetes is very important for long-term health. ? ?Strict return precautions given and understood.  Reports close follow-up is stressed. ? ?Co morbidities that complicated the patient's evaluation ? ?Poorly controlled diabetes ? ? ?Additional history obtained: ? ?External records from outside sources obtained and reviewed including prior ED visits and prior Inpatient records.  ? ? ?Lab Tests: ? ?I ordered and personally interpreted labs.  The pertinent results include: CBC, BMP ?Problem List / ED Course: ? ?Transient dizziness, mild hyperglycemia ? ? ?Reevaluation: ? ?After the interventions noted above, I reevaluated the patient and found  that they have: improved ? ? ?Disposition: ? ?After consideration of the diagnostic results and the patients response to treatment, I feel that the patent would benefit from close outpatient follow-up.  ? ? ? ? ? ? ? ? ?Final Clinical Impression(s) / ED Diagnoses ?Final diagnoses:  ?Dizziness  ? ? ?Rx / DC Orders ?ED Discharge Orders   ? ?      Ordered  ?  ondansetron (ZOFRAN) 4 MG tablet  Every 6 hours       ? 07/30/21 1445  ? ?  ?  ? ?  ? ? ?  ?Wynetta Fines, MD ?07/30/21 1448 ? ?

## 2021-07-30 NOTE — Discharge Instructions (Signed)
Return for any problem.  ?

## 2022-09-29 ENCOUNTER — Other Ambulatory Visit: Payer: Self-pay

## 2022-09-29 ENCOUNTER — Emergency Department (HOSPITAL_BASED_OUTPATIENT_CLINIC_OR_DEPARTMENT_OTHER): Payer: Medicaid Other

## 2022-09-29 ENCOUNTER — Observation Stay (HOSPITAL_COMMUNITY): Payer: Medicaid Other

## 2022-09-29 ENCOUNTER — Observation Stay (HOSPITAL_BASED_OUTPATIENT_CLINIC_OR_DEPARTMENT_OTHER)
Admission: EM | Admit: 2022-09-29 | Discharge: 2022-09-30 | Disposition: A | Discharge status: Home / Self Care | Payer: Medicaid Other | Attending: Internal Medicine | Admitting: Internal Medicine

## 2022-09-29 ENCOUNTER — Encounter (HOSPITAL_BASED_OUTPATIENT_CLINIC_OR_DEPARTMENT_OTHER): Payer: Self-pay | Admitting: Emergency Medicine

## 2022-09-29 DIAGNOSIS — Z79899 Other long term (current) drug therapy: Secondary | ICD-10-CM | POA: Insufficient documentation

## 2022-09-29 DIAGNOSIS — E119 Type 2 diabetes mellitus without complications: Secondary | ICD-10-CM | POA: Insufficient documentation

## 2022-09-29 DIAGNOSIS — I639 Cerebral infarction, unspecified: Secondary | ICD-10-CM

## 2022-09-29 DIAGNOSIS — R200 Anesthesia of skin: Secondary | ICD-10-CM | POA: Diagnosis not present

## 2022-09-29 DIAGNOSIS — G459 Transient cerebral ischemic attack, unspecified: Secondary | ICD-10-CM

## 2022-09-29 DIAGNOSIS — K219 Gastro-esophageal reflux disease without esophagitis: Secondary | ICD-10-CM | POA: Diagnosis present

## 2022-09-29 LAB — COMPREHENSIVE METABOLIC PANEL
ALT: 16 U/L (ref 0–44)
AST: 16 U/L (ref 15–41)
Albumin: 4.2 g/dL (ref 3.5–5.0)
Alkaline Phosphatase: 80 U/L (ref 38–126)
Anion gap: 9 (ref 5–15)
BUN: 10 mg/dL (ref 8–23)
CO2: 28 mmol/L (ref 22–32)
Calcium: 9.8 mg/dL (ref 8.9–10.3)
Chloride: 102 mmol/L (ref 98–111)
Creatinine, Ser: 0.89 mg/dL (ref 0.44–1.00)
GFR, Estimated: >60 mL/min (ref >60)
Glucose, Bld: 188 mg/dL — ABNORMAL HIGH (ref 70–99)
Potassium: 4.2 mmol/L (ref 3.5–5.1)
Sodium: 139 mmol/L (ref 135–145)
Total Bilirubin: 0.4 mg/dL (ref 0.3–1.2)
Total Protein: 7.9 g/dL (ref 6.5–8.1)

## 2022-09-29 LAB — DIFFERENTIAL
Abs Immature Granulocytes: 0.03 10*3/uL (ref 0.00–0.07)
Basophils Absolute: 0 10*3/uL (ref 0.0–0.1)
Basophils Relative: 1 %
Eosinophils Absolute: 0.2 10*3/uL (ref 0.0–0.5)
Eosinophils Relative: 4 %
Immature Granulocytes: 1 %
Lymphocytes Relative: 35 %
Lymphs Abs: 2.2 10*3/uL (ref 0.7–4.0)
Monocytes Absolute: 0.4 10*3/uL (ref 0.1–1.0)
Monocytes Relative: 6 %
Neutro Abs: 3.4 10*3/uL (ref 1.7–7.7)
Neutrophils Relative %: 53 %

## 2022-09-29 LAB — HEMOGLOBIN A1C
Hgb A1c MFr Bld: 8.9 % — ABNORMAL HIGH (ref 4.8–5.6)
Mean Plasma Glucose: 208.73 mg/dL

## 2022-09-29 LAB — URINALYSIS, ROUTINE W REFLEX MICROSCOPIC
Bilirubin Urine: NEGATIVE
Glucose, UA: NEGATIVE mg/dL
Hgb urine dipstick: NEGATIVE
Ketones, ur: NEGATIVE mg/dL
Leukocytes,Ua: NEGATIVE
Nitrite: POSITIVE — AB
Protein, ur: NEGATIVE mg/dL
Specific Gravity, Urine: 1.022 (ref 1.005–1.030)
pH: 5.5 (ref 5.0–8.0)

## 2022-09-29 LAB — CBC
HCT: 43.7 % (ref 36.0–46.0)
HCT: 38.8 % (ref 36.0–46.0)
Hemoglobin: 13.6 g/dL (ref 12.0–15.0)
Hemoglobin: 12.6 g/dL (ref 12.0–15.0)
MCH: 26.5 pg (ref 26.0–34.0)
MCH: 26.9 pg (ref 26.0–34.0)
MCHC: 31.1 g/dL (ref 30.0–36.0)
MCHC: 32.5 g/dL (ref 30.0–36.0)
MCV: 85 fL (ref 80.0–100.0)
MCV: 82.9 fL (ref 80.0–100.0)
Platelets: 230 10*3/uL (ref 150–400)
Platelets: 219 10*3/uL (ref 150–400)
RBC: 5.14 MIL/uL — ABNORMAL HIGH (ref 3.87–5.11)
RBC: 4.68 MIL/uL (ref 3.87–5.11)
RDW: 13.6 % (ref 11.5–15.5)
RDW: 13.3 % (ref 11.5–15.5)
WBC: 6.2 10*3/uL (ref 4.0–10.5)
WBC: 6.4 10*3/uL (ref 4.0–10.5)
nRBC: 0 % (ref 0.0–0.2)
nRBC: 0 % (ref 0.0–0.2)

## 2022-09-29 LAB — RAPID URINE DRUG SCREEN, HOSP PERFORMED
Amphetamines: NOT DETECTED
Barbiturates: NOT DETECTED
Benzodiazepines: NOT DETECTED
Cocaine: NOT DETECTED
Opiates: NOT DETECTED
Tetrahydrocannabinol: NOT DETECTED

## 2022-09-29 LAB — CREATININE, SERUM
Creatinine, Ser: 0.76 mg/dL (ref 0.44–1.00)
GFR, Estimated: >60 mL/min (ref >60)

## 2022-09-29 LAB — PROTIME-INR
INR: 1.1 (ref 0.8–1.2)
Prothrombin Time: 13.6 seconds (ref 11.4–15.2)

## 2022-09-29 LAB — CBG MONITORING, ED: Glucose-Capillary: 190 mg/dL — ABNORMAL HIGH (ref 70–99)

## 2022-09-29 LAB — ETHANOL: Alcohol, Ethyl (B): 11 mg/dL — ABNORMAL HIGH (ref <10)

## 2022-09-29 LAB — APTT: aPTT: 27 seconds (ref 24–36)

## 2022-09-29 LAB — GLUCOSE, CAPILLARY: Glucose-Capillary: 217 mg/dL — ABNORMAL HIGH (ref 70–99)

## 2022-09-29 LAB — HIV ANTIBODY (ROUTINE TESTING W REFLEX): HIV Screen 4th Generation wRfx: NONREACTIVE

## 2022-09-29 MED ORDER — SENNOSIDES-DOCUSATE SODIUM 8.6-50 MG PO TABS: 1.0000 | ORAL_TABLET | Freq: Every evening | ORAL | Status: DC | PRN

## 2022-09-29 MED ORDER — INSULIN ASPART 100 UNIT/ML IJ SOLN: 0.0000 [IU] | Freq: Every day | INTRAMUSCULAR | Status: DC

## 2022-09-29 MED ORDER — ACETAMINOPHEN 650 MG RE SUPP: 650.0000 mg | RECTAL | Status: DC | PRN

## 2022-09-29 MED ORDER — INSULIN ASPART 100 UNIT/ML IJ SOLN
0.0000 [IU] | Freq: Three times a day (TID) | INTRAMUSCULAR | Status: DC
  Administered 2022-09-30: 2 [IU] via SUBCUTANEOUS

## 2022-09-29 MED ORDER — PANTOPRAZOLE SODIUM 40 MG PO TBEC
40.0000 mg | DELAYED_RELEASE_TABLET | Freq: Every day | ORAL | Status: DC
  Administered 2022-09-29 – 2022-09-30 (×2): 40 mg via ORAL
  Filled 2022-09-29 (×2): qty 1

## 2022-09-29 MED ORDER — ASPIRIN 325 MG PO TBEC
650.0000 mg | DELAYED_RELEASE_TABLET | Freq: Once | ORAL | Status: AC
  Administered 2022-09-29: 650 mg via ORAL
  Filled 2022-09-29: qty 2

## 2022-09-29 MED ORDER — ACETAMINOPHEN 160 MG/5ML PO SOLN: 650.0000 mg | ORAL | Status: DC | PRN

## 2022-09-29 MED ORDER — ASPIRIN 325 MG PO TABS: 325.0000 mg | ORAL_TABLET | Freq: Every day | ORAL | Status: DC

## 2022-09-29 MED ORDER — ASPIRIN 81 MG PO TBEC
81.0000 mg | DELAYED_RELEASE_TABLET | Freq: Every day | ORAL | Status: DC
  Administered 2022-09-30: 81 mg via ORAL
  Filled 2022-09-29: qty 1

## 2022-09-29 MED ORDER — ASPIRIN 300 MG RE SUPP: 300.0000 mg | Freq: Every day | RECTAL | Status: DC

## 2022-09-29 MED ORDER — IOHEXOL 350 MG/ML SOLN
75.0000 mL | Freq: Once | INTRAVENOUS | Status: AC | PRN
  Administered 2022-09-29: 75 mL via INTRAVENOUS

## 2022-09-29 MED ORDER — ENOXAPARIN SODIUM 40 MG/0.4ML IJ SOSY
40.0000 mg | PREFILLED_SYRINGE | INTRAMUSCULAR | Status: DC
  Administered 2022-09-29: 40 mg via SUBCUTANEOUS
  Filled 2022-09-29: qty 0.4

## 2022-09-29 MED ORDER — STROKE: EARLY STAGES OF RECOVERY BOOK
Freq: Once | Status: AC
  Filled 2022-09-29: qty 1

## 2022-09-29 MED ORDER — SODIUM CHLORIDE 0.9 % IV SOLN: INTRAVENOUS | Status: DC

## 2022-09-29 MED ORDER — ACETAMINOPHEN 325 MG PO TABS
650.0000 mg | ORAL_TABLET | ORAL | Status: DC | PRN
  Administered 2022-09-29 – 2022-09-30 (×2): 650 mg via ORAL
  Filled 2022-09-29 (×3): qty 2

## 2022-09-29 NOTE — ED Provider Notes (Signed)
[place] EMERGENCY DEPARTMENT AT [facility] Provider Note   CSN: [id number] Arrival date & time: 09/29/22  1029     History  Chief Complaint  Patient presents with   Arm Numbness    Heather Cook is a 62 y.o. female.  HPI    62 year old female comes in with chief complaint of right arm numbness.  Patient has history of recently diagnosed diabetes.  She indicates that she was last known normal at 8 AM.  When she dropped her grandson off to school and was returning, she started noticing numbness in her right upper extremity.  She was sweating at some point before dropping her grandson off to school.   Review of system is negative for any headache, neck pain, nausea, vomiting, dizziness, vision change.  Home Medications Prior to Admission medications   Medication Sig Start Date End Date Taking? Authorizing Provider  acetaminophen (TYLENOL) 325 MG tablet Take 650 mg by mouth every 6 (six) hours as needed.   Yes [provider]  azithromycin (ZITHROMAX) 250 MG tablet Take 1 tablet (250 mg total) by mouth daily. Take first 2 tablets together, then 1 every day until finished. Patient not taking: Reported on 09/29/2022 03/02/21   Geoffery Lyons, MD  diazepam (VALIUM) 5 MG tablet Take 1 tablet (5 mg total) by mouth every 8 (eight) hours as needed for muscle spasms. Patient not taking: Reported on 09/29/2022 07/06/17   Gilda Crease, MD  HYDROcodone-acetaminophen (NORCO/VICODIN) 5-325 MG tablet Take 1-2 tablets by mouth every 4 (four) hours as needed. Patient not taking: Reported on 09/29/2022 07/06/17   Gilda Crease, MD  ibuprofen (ADVIL,MOTRIN) 800 MG tablet Take 1 tablet (800 mg total) by mouth 3 (three) times daily. 07/06/17   Gilda Crease, MD  nitrofurantoin, macrocrystal-monohydrate, (MACROBID) 100 MG capsule Take 1 capsule (100 mg total) by mouth 2 (two) times daily. Patient not taking: Reported on 09/29/2022 09/06/20   Joy, Shawn C, PA-C  ondansetron  (ZOFRAN) 4 MG tablet Take 1 tablet (4 mg total) by mouth every 6 (six) hours. Patient not taking: Reported on 09/29/2022 07/30/21   Wynetta Fines, MD  oseltamivir (TAMIFLU) 75 MG capsule Take 1 capsule (75 mg total) by mouth every 12 (twelve) hours. Patient not taking: Reported on 09/29/2022 08/08/18   Raeford Razor, MD  predniSONE (DELTASONE) 10 MG tablet Take 2 tablets (20 mg total) by mouth 2 (two) times daily. Patient not taking: Reported on 09/29/2022 03/02/21   Geoffery Lyons, MD      Allergies    Penicillins, Almond oil, Peanuts [peanut oil], Morphine and related, Strawberry extract, and Tramadol    Review of Systems   Review of Systems  All other systems reviewed and are negative.   Physical Exam Updated Vital Signs BP (!) 150/85   Pulse 80   Temp 98.6 F (37 C) (Oral)   Resp 17   Wt 88 kg   LMP 08/12/2014 (Exact Date)   SpO2 100%   BMI 34.37 kg/m  Physical Exam Vitals and nursing note reviewed.  Constitutional:      Appearance: She is well-developed.  HENT:     Head: Atraumatic.  Eyes:     Extraocular Movements: Extraocular movements intact.     Pupils: Pupils are equal, round, and reactive to light.  Cardiovascular:     Rate and Rhythm: Normal rate.  Pulmonary:     Effort: Pulmonary effort is normal.  Musculoskeletal:     Cervical back: Normal range of motion  and neck supple.  Skin:    General: Skin is warm and dry.  Neurological:     Mental Status: She is alert and oriented to person, place, and time.     Comments: Patient has numbness over the right upper extremity and she has slight weakness compared to the contralateral side both of her right upper and lower extremity.  LVO negative.  Visual fields are intact     ED Results / Procedures / Treatments   Labs (all labs ordered are listed, but only abnormal results are displayed) Labs Reviewed  ETHANOL - Abnormal; Notable for the following components:      Result Value   Alcohol, Ethyl (B) 11 (*)     All other components within normal limits  CBC - Abnormal; Notable for the following components:   RBC 5.14 (*)    All other components within normal limits  COMPREHENSIVE METABOLIC PANEL - Abnormal; Notable for the following components:   Glucose, Bld 188 (*)    All other components within normal limits  URINALYSIS, ROUTINE W REFLEX MICROSCOPIC - Abnormal; Notable for the following components:   Nitrite POSITIVE (*)    Bacteria, UA MANY (*)    All other components within normal limits  CBG MONITORING, ED - Abnormal; Notable for the following components:   Glucose-Capillary 190 (*)    All other components within normal limits  PROTIME-INR  APTT  DIFFERENTIAL  RAPID URINE DRUG SCREEN, HOSP PERFORMED    EKG None  Radiology CT HEAD CODE STROKE WO CONTRAST  Result Date: 09/29/2022 CLINICAL DATA:  Code stroke. Neuro deficit, acute, stroke suspected right numbness EXAM: CT HEAD WITHOUT CONTRAST TECHNIQUE: Contiguous axial images were obtained from the base of the skull through the vertex without intravenous contrast. RADIATION DOSE REDUCTION: This exam was performed according to the departmental dose-optimization program which includes automated exposure control, adjustment of the mA and/or kV according to patient size and/or use of iterative reconstruction technique. COMPARISON:  None Available. FINDINGS: Brain: Small, potentially acute perforator infarct in the left basal ganglia. No evidence of acute hemorrhage, hydrocephalus, extra-axial collection or mass lesion/mass effect. Vascular: No hyperdense vessel identified. Skull: No acute fracture. Sinuses/Orbits: Largely clear sinuses.  No acute findings. Other: No mastoid effusions. ASPECTS [facility] Stroke Program Early CT Score) total score (0-10 with 10 being normal): 9 at worst. IMPRESSION: 1. 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 Recommend MRI. 2. No acute hemorrhage. Code stroke imaging results were  communicated on 09/29/2022 at 11:18 am to provider [place] via telephone, who verbally acknowledged these results. Electronically Signed   By: Feliberto Harts M.D.   On: 09/29/2022 11:20    Procedures Procedures    Medications Ordered in ED Medications  aspirin EC tablet 81 mg (has no administration in time range)  aspirin EC tablet 650 mg (650 mg Oral Given 09/29/22 1217)    ED Course/ Medical Decision Making/ A&P                             Medical Decision Making Amount and/or Complexity of Data Reviewed Labs: ordered. Radiology: ordered.  Risk Decision regarding hospitalization.  62 year old female comes in with chief complaint of right upper extremity numbness.  Patient has history of diabetes.  Last known normal at 8 AM.  On exam, it appears that patient has subtle weakness or both right upper and lower extremity as well, but the grip strength is normal.  LVO screen is negative.  Patient seen by me right after 11 AM, code stroke was activated, although the symptoms are not significant/pronounced enough to require TNK in my opinion.  Differential diagnosis for this patient includes ischemic stroke, embolic stroke, electrolyte disturbance, cervical spine radiculopathy.  Neurology team was consulted as per stroke workup.  They recommend that patient be admitted to the hospital.  CT scan of the brain was independently interpreted.  There is no evidence of brain bleed.  I did receive a call from radiologist indicating that most likely there is a left-sided stroke.  MRI of the brain has been ordered -however the MRI tech indicated that patient has some kind of metal in her hair, which was detected when patient was scanned.  The patient states that there is no metal in her hair.  Final Clinical Impression(s) / ED Diagnoses Final diagnoses:  Cerebrovascular accident (CVA), unspecified mechanism (HCC)    Rx / DC Orders ED Discharge Orders     None         Derwood Kaplan, MD 09/29/22 1356

## 2022-09-29 NOTE — Plan of Care (Signed)

## 2022-09-29 NOTE — ED Triage Notes (Signed)
Pt arrives pov, steady gait, endorses acute RT arm numbness while driving at 5784. Diaphoresis at 0730. Last normal was just prior to numbness. Speech clear, bilaterally equal frip, pt endorses decreased sensation on RT arm. No facial droop noted.

## 2022-09-29 NOTE — Consult Note (Signed)
[facility] TeleNeurology Consult Services    Date of Service:  09/29/2022      Metrics: Last Known Well: 0800 Symptoms: As per HPI.  Patient is not a candidate for thrombolytic. Symptoms too mild to treat.   Location of the provider: [facility]  Location of the patient: [facility] Pre-Morbid Modified Rankin Scale: 0 Time Code Stroke Page received:  1114 Time neurologist arrived:  1117 Time NIHSS completed: 1133    This consult was provided via telemedicine with 2-way video and audio communication. The patient/family was informed that care would be provided in this way and agreed to receive care in this manner.   ED Physician notified of diagnostic impression and management plan at: 83   Assessment: 62 year old female presenting with RUE weakness and numbness. Code Stroke called after arrival to the [facility] ED  - Exam reveals bilateral altitudinal visual field defect that does not localize as a CNS lesion. There is RUE sensory numbness but no drift.  - CT head: Small, potentially acute perforator infarct in the left basal ganglia per official Radiology report. On personal review of the images, this appears more likely to be due to a chronic lacunar infarct or incidental neuroglial cyst.  - Stroke risk factors: DM - DDx for presentation includes acute lacunar infarction of the left thalamus versus stress-related symptoms or conversion disorder.  - Given equivocal etiology as well as mild symptoms/signs, risks of TNK are felt to significantly outweigh potential benefits.  - Presentation is not consistent with LVO.  - Will need stroke/TIA work up     Recommendations: - Stroke/TIA work up to include MRI brain and MRA of head and neck, as well as TTE and cardiac telemetry.  - ASA 650 mg crushed PO x 1 now.  - Start ASA 81 mg po qd - HgbA1c, fasting lipid panel - PT consult, OT consult, Speech consult - Consider initiation of a statin pending  results of work up. Obtain baseline CK level and transaminases if starting a statin.  - Permissive HTN x 24 hours:  - Risk factor modification - Frequent neuro checks - NPO until passes stroke swallow screen - Glycemic control       ------------------------------------------------------------------------------   History of Present Illness: The patient is a 62 year old female with a PMHx of DM, GERD and stomach ulcers as well as sciatica who presents with acute onset of LUE weakness and numbness. This occurred while driving home after dropping her grandson off to school. First noticed as difficulty holding the steering wheel with her right hand, using her right arm to steer and also with RUE numbness described as "it felt like it was not there". She had no RLE symptoms including no gait difficulty, but after arriving to [facility] her RLE was noted to drift when held antigravity. She also has a mild 3/10 headache but states that this is not unusual for her. She was dizzy previously but this did not coincide with the above symptoms.   ROS: Did have a green-colored bowel movement this morning. No other symptoms except as noted above, including no CP, SOB, facial droop, vision change, confusion, aphasia, ataxia, fever, abdominal pain or difficulty urinating.       Past Medical History: Past Medical History:  Diagnosis Date   Diabetes mellitus without complication (HCC)    GERD (gastroesophageal reflux disease)    H. pylori infection    History of stomach ulcers    Sciatica  Past Surgical History: Past Surgical History:  Procedure Laterality Date   TUBAL LIGATION         Medications:  No current facility-administered medications on file prior to encounter.   Current Outpatient Medications on File Prior to Encounter  Medication Sig Dispense Refill   acetaminophen (TYLENOL) 500 MG tablet Take 500 mg by mouth every 6 (six) hours as needed for mild pain or moderate pain.      azithromycin (ZITHROMAX) 250 MG tablet Take 1 tablet (250 mg total) by mouth daily. Take first 2 tablets together, then 1 every day until finished. (Patient not taking: Reported on 09/29/2022) 6 tablet 0   diazepam (VALIUM) 5 MG tablet Take 1 tablet (5 mg total) by mouth every 8 (eight) hours as needed for muscle spasms. 10 tablet 0   HYDROcodone-acetaminophen (NORCO/VICODIN) 5-325 MG tablet Take 1-2 tablets by mouth every 4 (four) hours as needed. 6 tablet 0   ibuprofen (ADVIL,MOTRIN) 800 MG tablet Take 1 tablet (800 mg total) by mouth 3 (three) times daily. 21 tablet 0   nitrofurantoin, macrocrystal-monohydrate, (MACROBID) 100 MG capsule Take 1 capsule (100 mg total) by mouth 2 (two) times daily. 10 capsule 0   ondansetron (ZOFRAN) 4 MG tablet Take 1 tablet (4 mg total) by mouth every 6 (six) hours. 12 tablet 0   oseltamivir (TAMIFLU) 75 MG capsule Take 1 capsule (75 mg total) by mouth every 12 (twelve) hours. (Patient not taking: Reported on 09/29/2022) 10 capsule 0   predniSONE (DELTASONE) 10 MG tablet Take 2 tablets (20 mg total) by mouth 2 (two) times daily. 20 tablet 0         Social History: Drug Use: None Nonsmoker No EtOH use   Family History:  Reviewed in Epic   ROS: As per HPI    Anticoagulant use:  No   Antiplatelet use: No   Examination:    BP (!) 150/85   Pulse 80   Temp 98.6 F (37 C) (Oral)   Resp 17   Wt 88 kg   LMP 08/12/2014 (Exact Date)   SpO2 100%   BMI 34.37 kg/m   1A: Level of Consciousness - 0 1B: Ask Month and Age - 0 1C: Blink Eyes & Squeeze Hands - 0 2: Test Horizontal Extraocular Movements - 0 3: Test Visual Fields - 1 (altitudinal defect of superior hemifields both eyes) 4: Test Facial Palsy (Use Grimace if Obtunded) - 0 5A: Test Left Arm Motor Drift - 0 5B: Test Right Arm Motor Drift - 0 6A: Test Left Leg Motor Drift - 0 6B: Test Right Leg Motor Drift - 0 7: Test Limb Ataxia (FNF/Heel-Shin) - 0 8: Test Sensation -  1 (RUE numbness) 9: Test  Language/Aphasia - 0 10: Test Dysarthria - 0 11: Test Extinction/Inattention - 0   NIHSS Score: 2     Patient/Family was informed the Neurology Consult would occur via TeleHealth consult by way of interactive audio and video telecommunications and consented to receiving care in this manner.   Patient is being evaluated for possible acute neurologic impairment and high pretest probability of imminent or life-threatening deterioration. I spent total of 40 minutes providing care to this patient, including time for face to face visit via telemedicine, review of medical records, imaging studies and discussion of findings with providers, the patient and/or family.   Electronically signed: Dr. Caryl Pina

## 2022-09-29 NOTE — ED Notes (Signed)
ED Provider at bedside. 

## 2022-09-29 NOTE — Progress Notes (Signed)
Telestroke cart was activated at 1104. Per treatment team, pt's LKW was at 0800 with c/o RUA numbness/ RLL weakness/drift, HA, Dizziness. Dr. Rhunette Croft, EDP at bedside assessing patient prior to cart activation. Pt transported to CT at 1107 and returned to room at 1115. [facility] was paged for code stroke at 1106 and 1112. Dr. Otelia Limes, [facility] appeared on telestroke cart at 1117 to assess the patient. Based on T[facility]D's assessment, pt does not meet criteria for emergent interventions at this time 2/2 no disabling symptoms. [facility] to f/u with EDP regarding recommendations. No further needs from Telestroke RN at this time. Telestroke cart disconnected at 1136.

## 2022-09-29 NOTE — H&P (Addendum)
History and Physical  Patient: Heather Cook ZOX:[id number] DOB: 1960-07-04 DOA: 09/29/2022 DOS: the patient was seen and examined on 09/29/2022 Patient coming from: Home  Chief Complaint:  Chief Complaint  Patient presents with   Arm Numbness   HPI: Heather Cook is a 62 y.o. female with PMH significant of diabetes mellitus, not on any medications, GERD, PUD, scented with acute onset of right arm weakness, numbness while driving.  Patient reported that this occurred around 8:10AM when she was driving home after dropping her grandson off to school.  She was 10 minutes away from her apartment when she noticed right arm weakness and difficulty holding the steering wheel.  She also felt numbness and tingling.  She was able to get home and called her god-daughter.  Also reported headache, dizziness but no blurry vision or vision changes, slurred speech or lower extremity weakness.  States right arm weakness improved after 10-15 minutes however right arm numbness still persisted. Of note she has a history of diabetes mellitus, could not tolerate metformin and has not been taking any medications.   ED course:  Afebrile, respiratory rate 17, pulse 80, BP 150/85 CMET unremarkable except glucose 188 CBC unremarkable UA showed positive nitrite, many bacteria, WBC 0-5, negative leukocytes CT head showed all potentially acute perforator infarct in the left basal ganglia, recommended MRI, no acute hemorrhage   Review of Systems: As mentioned in the history of present illness. All other systems reviewed and are negative. Past Medical History:  Diagnosis Date   Diabetes mellitus without complication (HCC)    GERD (gastroesophageal reflux disease)    H. pylori infection    History of stomach ulcers    Sciatica    Past Surgical History:  Procedure Laterality Date   TUBAL LIGATION     Social History:  reports that she has never smoked. She has never used smokeless tobacco. She reports that she does not drink  alcohol and does not use drugs. Allergies  Allergen Reactions   Penicillins Swelling    Has patient had a PCN reaction causing immediate rash, facial/tongue/throat swelling, SOB or lightheadedness with hypotension: Yes Has patient had a PCN reaction causing severe rash involving mucus membranes or skin necrosis: No Has patient had a PCN reaction that required hospitalization: No Has patient had a PCN reaction occurring within the last 10 years: No If all of the above answers are "NO", then may proceed with Cephalosporin use.    Childhood allergy.   Almond Oil Swelling   Peanuts [Peanut Oil] Swelling    Facial swelling   Morphine And Related Nausea And Vomiting   Strawberry Extract Other (See Comments)    Dizziness.    Tramadol Nausea Only   Family History  Adopted: Yes  Family history unknown: Yes   Prior to Admission medications   Medication Sig Start Date End Date Taking? Authorizing Provider  acetaminophen (TYLENOL) 325 MG tablet Take 650 mg by mouth every 6 (six) hours as needed.   Yes [provider]  azithromycin (ZITHROMAX) 250 MG tablet Take 1 tablet (250 mg total) by mouth daily. Take first 2 tablets together, then 1 every day until finished. Patient not taking: Reported on 09/29/2022 03/02/21   Geoffery Lyons, MD  diazepam (VALIUM) 5 MG tablet Take 1 tablet (5 mg total) by mouth every 8 (eight) hours as needed for muscle spasms. Patient not taking: Reported on 09/29/2022 07/06/17   Gilda Crease, MD  HYDROcodone-acetaminophen (NORCO/VICODIN) 5-325 MG tablet Take 1-2 tablets by mouth  every 4 (four) hours as needed. Patient not taking: Reported on 09/29/2022 07/06/17   Gilda Crease, MD  ibuprofen (ADVIL,MOTRIN) 800 MG tablet Take 1 tablet (800 mg total) by mouth 3 (three) times daily. 07/06/17   Gilda Crease, MD  nitrofurantoin, macrocrystal-monohydrate, (MACROBID) 100 MG capsule Take 1 capsule (100 mg total) by mouth 2 (two) times daily. Patient  not taking: Reported on 09/29/2022 09/06/20   Joy, Shawn C, PA-C  ondansetron (ZOFRAN) 4 MG tablet Take 1 tablet (4 mg total) by mouth every 6 (six) hours. Patient not taking: Reported on 09/29/2022 07/30/21   Wynetta Fines, MD  oseltamivir (TAMIFLU) 75 MG capsule Take 1 capsule (75 mg total) by mouth every 12 (twelve) hours. Patient not taking: Reported on 09/29/2022 08/08/18   Raeford Razor, MD  predniSONE (DELTASONE) 10 MG tablet Take 2 tablets (20 mg total) by mouth 2 (two) times daily. Patient not taking: Reported on 09/29/2022 03/02/21   Geoffery Lyons, MD   Physical Exam: Vitals:   09/29/22 1040 09/29/22 1045 09/29/22 1100 09/29/22 1112  BP:  (!) 144/69 (!) 150/85   Pulse:  79 80   Resp:  17 17   Temp:    98.6 F (37 C)  TempSrc:    Oral  SpO2:  100% 100%   Weight: 88 kg        General: Alert, awake, oriented x3, NAD Eyes: pink conjunctiva, anicteric sclera, PERLA HEENT: normocephalic, atraumatic, oropharynx clear Neck: supple, no masses or lymphadenopathy, no JVD CVS: Regular rate and rhythm, no murmurs, rubs or gallops. Resp : Clear to auscultation bilaterally, no wheezing, rales or rhonchi. GI : Soft, nontender, nondistended, positive bowel sounds. No hepatomegaly.  Ext: No lower extremity edema  Musculoskeletal: No clubbing or cyanosis,  ROM intact  Neuro: Grossly intact, strength 5/5 upper and lower extremities bilaterally, + Rt arm numbness Psych: alert and oriented x 3, normal mood and affect Skin: no rashes or lesions, warm and dry   Data Reviewed: I have reviewed ED notes, Vitals, Lab results and outpatient records.   Recent Labs  Lab 09/29/22 1133  NA 139  K 4.2  CL 102  CO2 28  GLUCOSE 188*  BUN 10  CREATININE 0.89  CALCIUM 9.8   Recent Labs  Lab 09/29/22 1133  WBC 6.2  NEUTROABS 3.4  HGB 13.6  HCT 43.7  MCV 85.0  PLT 230    Assessment and Plan Principal Problem:   Acute CVA (cerebrovascular accident) (HCC) -Presented with right arm numbness  and weakness, symptoms improving.   -CT head showed small potentially acute infarct in the left basal ganglia, recommended MRI, no acute hemorrhage. -tele-neurology consult obtained, note per Dr. Otelia Limes. I spoke to Dr Wilford Corner (neurology on-call, patient was unable to do MRI at [facility] due to ?some kind metal in her hair.  Recommended CTA head and neck, Stroke team will follow in a.m., may need to repeat CT head after 24 hours. Will follow stroke team recommendations -Continue aspirin 81 mg daily, - obtain HbA1c, lipid panel -Obtain 2D echo -PT OT, SLP eval -No reported prior history of hypertension. BP readings somewhat elevated, permissive hypertension due to acute stroke   Active Problems:   Controlled type 2 diabetes mellitus without complication, without long-term current use of insulin (HCC) -Obtain hemoglobin A1c, placed on sliding scale insulin while inpatient -Diabetic coordinator consult. -Patient has history of diabetes, not on any medications, states was started on metformin but could not tolerate it.  Will need a different agent at discharge.  GERD, PUD -Placed on Protonix 40 mg daily   Advance Care Planning:   Code Status: Full Code discussed with the patient Consults: Neurology Family Communication: Discussed with 2 daughters at the bedside Severity of Illness:      The appropriate patient status for this patient is OBSERVATION. Observation status is judged to be reasonable and necessary in order to provide the required intensity of service to ensure the patient's safety. The patient's presenting symptoms, physical exam findings, and initial radiographic and laboratory data in the context of their medical condition is felt to place them at decreased risk for further clinical deterioration. Furthermore, it is anticipated that the patient will be medically stable for discharge from the hospital within 2 midnights of admission.     Author: Thad Ranger,  MD 09/29/2022 4:56 PM For on call review [email or web address].

## 2022-09-29 NOTE — ED Notes (Signed)
ED Provider Nanavati at bedside.

## 2022-09-29 NOTE — ED Notes (Signed)
Called [facility] -- informed that pt bed assignment is ready 

## 2022-09-29 NOTE — ED Notes (Signed)
Pt transported to CT ?

## 2022-09-29 NOTE — ED Notes (Signed)
EDP Nanavati requested to see pt before calling code stroke

## 2022-09-30 ENCOUNTER — Observation Stay (HOSPITAL_BASED_OUTPATIENT_CLINIC_OR_DEPARTMENT_OTHER): Payer: Medicaid Other

## 2022-09-30 ENCOUNTER — Other Ambulatory Visit (HOSPITAL_COMMUNITY): Payer: Self-pay

## 2022-09-30 ENCOUNTER — Observation Stay (HOSPITAL_COMMUNITY): Payer: Medicaid Other

## 2022-09-30 DIAGNOSIS — G459 Transient cerebral ischemic attack, unspecified: Secondary | ICD-10-CM

## 2022-09-30 DIAGNOSIS — K2100 Gastro-esophageal reflux disease with esophagitis, without bleeding: Secondary | ICD-10-CM

## 2022-09-30 LAB — BASIC METABOLIC PANEL
Anion gap: 7 (ref 5–15)
BUN: 7 mg/dL — ABNORMAL LOW (ref 8–23)
CO2: 23 mmol/L (ref 22–32)
Calcium: 8.7 mg/dL — ABNORMAL LOW (ref 8.9–10.3)
Chloride: 104 mmol/L (ref 98–111)
Creatinine, Ser: 0.87 mg/dL (ref 0.44–1.00)
GFR, Estimated: >60 mL/min (ref >60)
Glucose, Bld: 206 mg/dL — ABNORMAL HIGH (ref 70–99)
Potassium: 3.9 mmol/L (ref 3.5–5.1)
Sodium: 134 mmol/L — ABNORMAL LOW (ref 135–145)

## 2022-09-30 LAB — CBC
HCT: 37.2 % (ref 36.0–46.0)
Hemoglobin: 12.1 g/dL (ref 12.0–15.0)
MCH: 26.8 pg (ref 26.0–34.0)
MCHC: 32.5 g/dL (ref 30.0–36.0)
MCV: 82.5 fL (ref 80.0–100.0)
Platelets: 223 10*3/uL (ref 150–400)
RBC: 4.51 MIL/uL (ref 3.87–5.11)
RDW: 13.5 % (ref 11.5–15.5)
WBC: 5 10*3/uL (ref 4.0–10.5)
nRBC: 0 % (ref 0.0–0.2)

## 2022-09-30 LAB — ECHOCARDIOGRAM COMPLETE
AR max vel: 2.88 cm2
AV Peak grad: 6.1 mmHg
Ao pk vel: 1.23 m/s
Area-P 1/2: 3.72 cm2
S' Lateral: 2.1 cm
Weight: 3104 oz

## 2022-09-30 LAB — LIPID PANEL
Cholesterol: 198 mg/dL (ref 0–200)
HDL: 43 mg/dL (ref >40)
LDL Cholesterol: 136 mg/dL — ABNORMAL HIGH (ref 0–99)
Total CHOL/HDL Ratio: 4.6 RATIO
Triglycerides: 94 mg/dL (ref <150)
VLDL: 19 mg/dL (ref 0–40)

## 2022-09-30 LAB — GLUCOSE, CAPILLARY
Glucose-Capillary: 167 mg/dL — ABNORMAL HIGH (ref 70–99)
Glucose-Capillary: 190 mg/dL — ABNORMAL HIGH (ref 70–99)

## 2022-09-30 MED ORDER — ATORVASTATIN CALCIUM 40 MG PO TABS: 40.0000 mg | ORAL_TABLET | Freq: Every day | ORAL | Status: DC

## 2022-09-30 MED ORDER — OXYCODONE HCL 5 MG PO TABS
5.0000 mg | ORAL_TABLET | Freq: Once | ORAL | Status: AC | PRN
  Administered 2022-09-30: 5 mg via ORAL
  Filled 2022-09-30: qty 1

## 2022-09-30 MED ORDER — ASPIRIN 81 MG PO TBEC
81.0000 mg | DELAYED_RELEASE_TABLET | Freq: Every day | ORAL | 1 refills | Status: DC
  Filled 2022-09-30 (×2): qty 30, 30d supply, fill #0

## 2022-09-30 MED ORDER — ATORVASTATIN CALCIUM 40 MG PO TABS
40.0000 mg | ORAL_TABLET | Freq: Every day | ORAL | 1 refills | Status: DC
  Filled 2022-09-30: qty 30, 30d supply, fill #0

## 2022-09-30 MED ORDER — CLOPIDOGREL BISULFATE 75 MG PO TABS
75.0000 mg | ORAL_TABLET | Freq: Every day | ORAL | 0 refills | Status: DC
  Filled 2022-09-30: qty 21, 21d supply, fill #0

## 2022-09-30 MED ORDER — EMPAGLIFLOZIN 10 MG PO TABS
10.0000 mg | ORAL_TABLET | Freq: Every day | ORAL | 1 refills | Status: DC
  Filled 2022-09-30: qty 30, 30d supply, fill #0

## 2022-09-30 MED ORDER — EMPAGLIFLOZIN 10 MG PO TABS
10.0000 mg | ORAL_TABLET | Freq: Every day | ORAL | Status: DC
  Filled 2022-09-30: qty 1

## 2022-09-30 MED ORDER — LORAZEPAM 2 MG/ML IJ SOLN: 1.0000 mg | Freq: Once | INTRAMUSCULAR | Status: DC | PRN

## 2022-09-30 MED ORDER — CLOPIDOGREL BISULFATE 75 MG PO TABS: 75.0000 mg | ORAL_TABLET | Freq: Every day | ORAL | Status: DC

## 2022-09-30 MED ORDER — PANTOPRAZOLE SODIUM 40 MG PO TBEC
40.0000 mg | DELAYED_RELEASE_TABLET | Freq: Every day | ORAL | 1 refills | Status: DC
  Filled 2022-09-30: qty 30, 30d supply, fill #0

## 2022-09-30 NOTE — Discharge Summary (Signed)
PATIENT DETAILS Name: Heather Cook Age: 62 y.o. Sex: female Date of Birth: [birth year] MRN: [id number]. Admitting Physician: Emeline General, MD WUJ:[id number], No Pcp Per  Admit Date: 09/29/2022 Discharge date: 09/30/2022  Recommendations for Outpatient Follow-up:  Follow up with PCP in 1-2 weeks Please obtain CMP/CBC in one week  Admitted From:  Home  Disposition: Home   Discharge Condition: good  CODE STATUS:   Code Status: Full Code   Diet recommendation:  Diet Order             Diet - low sodium heart healthy           Diet Carb Modified Fluid consistency: Thin; Room service appropriate? No  Diet effective now                    Brief Summary: Patient is a 62 y.o.  female with past medical history of diabetes-not on any medications (claims could not tolerate metformin) presented with right arm weakness/numbness.   Significant events: 5/2>> admit to [facility]   Significant studies: 5/2>> CT head: Small probable acute perforator infarct in the left basal ganglia. 5/2>> CTA head/neck: No stenosis/LVO 5/2>> A1c: 8.9 5/3>> LDL: 136 5/3>> echo: EF 60-65% 5/3>> MRI brain: No acute CVA   Significant microbiology data: None   Procedures: None   Consults: Neurology  Brief Hospital Course: TIA CT head suggestive of left basal ganglia infarct-however no infarct seen on MRI brain Right arm weakness has resolved Symptomatology probably due to TIA Continue ASA/Plavix x 3 weeks then aspirin alone, continue high intensity statin on discharge.   Patient aware that she needs to modify her risk factors to minimize risk of CVA in the future.   DM-2 Very hesitant to start new medications-apparently intolerant to metformin in the past-if she is agreeable-will start jardiance   GERD/PUD PPI   transition of care team following for medication assistance/outpatient PCP arrangement.   Obesity: Estimated body mass index is 34.37 kg/m as calculated from the following:    Height as of 03/02/21: 5\' 3"  (1.6 m).   Weight as of this encounter: 88 kg.  Discharge Diagnoses:  Principal Problem:   Acute CVA (cerebrovascular accident) [facility]) Active Problems:   Controlled type 2 diabetes mellitus without complication, without long-term current use of insulin (HCC)   GERD (gastroesophageal reflux disease)   Discharge Instructions:  Activity:  As tolerated   Discharge Instructions     Ambulatory referral to Neurology   Complete by: As directed    An appointment is requested in approximately: 4 weeks   Call MD for:  persistant nausea and vomiting   Complete by: As directed    Call MD for:  severe uncontrolled pain   Complete by: As directed    Diet - low sodium heart healthy   Complete by: As directed    Discharge instructions   Complete by: As directed    Follow with Primary MD in 1-2 weeks  Follow-up with neurology MD-clinic will call you with a follow-up appointment.  Please get a complete blood count and chemistry panel checked by your Primary MD at your next visit, and again as instructed by your Primary MD.  Get Medicines reviewed and adjusted: Please take all your medications with you for your next visit with your Primary MD  Laboratory/radiological data: Please request your Primary MD to go over all hospital tests and procedure/radiological results at the follow up, please ask your Primary MD to get all Hospital records sent  to his/her office.  In some cases, they will be blood work, cultures and biopsy results pending at the time of your discharge. Please request that your primary care M.D. follows up on these results.  Also Note the following: If you experience worsening of your admission symptoms, develop shortness of breath, life threatening emergency, suicidal or homicidal thoughts you must seek medical attention immediately by calling 911 or calling your MD immediately  if symptoms less severe.  You must read complete  instructions/literature along with all the possible adverse reactions/side effects for all the Medicines you take and that have been prescribed to you. Take any new Medicines after you have completely understood and accpet all the possible adverse reactions/side effects.   Do not drive when taking Pain medications or sleeping medications (Benzodaizepines)  Do not take more than prescribed Pain, Sleep and Anxiety Medications. It is not advisable to combine anxiety,sleep and pain medications without talking with your primary care practitioner  Special Instructions: If you have smoked or chewed Tobacco  in the last 2 yrs please stop smoking, stop any regular Alcohol  and or any Recreational drug use.  Wear Seat belts while driving.  Please note: You were cared for by a hospitalist during your hospital stay. Once you are discharged, your primary care physician will handle any further medical issues. Please note that NO REFILLS for any discharge medications will be authorized once you are discharged, as it is imperative that you return to your primary care physician (or establish a relationship with a primary care physician if you do not have one) for your post hospital discharge needs so that they can reassess your need for medications and monitor your lab values.   Increase activity slowly   Complete by: As directed       Allergies as of 09/30/2022       Reactions   Penicillins Swelling   Has patient had a PCN reaction causing immediate rash, facial/tongue/throat swelling, SOB or lightheadedness with hypotension: Yes Has patient had a PCN reaction causing severe rash involving mucus membranes or skin necrosis: No Has patient had a PCN reaction that required hospitalization: No Has patient had a PCN reaction occurring within the last 10 years: No If all of the above answers are "NO", then may proceed with Cephalosporin use. Childhood allergy.   Almond Oil Swelling   Peanuts [peanut Oil]  Swelling   Facial swelling   Morphine And Related Nausea And Vomiting   Strawberry Extract Other (See Comments)   Dizziness.    Tramadol Nausea Only        Medication List     STOP taking these medications    acetaminophen 325 MG tablet Commonly known as: TYLENOL   azithromycin 250 MG tablet Commonly known as: ZITHROMAX   diazepam 5 MG tablet Commonly known as: Valium   HYDROcodone-acetaminophen 5-325 MG tablet Commonly known as: NORCO/VICODIN   ibuprofen 800 MG tablet Commonly known as: ADVIL   nitrofurantoin (macrocrystal-monohydrate) 100 MG capsule Commonly known as: MACROBID   ondansetron 4 MG tablet Commonly known as: ZOFRAN   oseltamivir 75 MG capsule Commonly known as: TAMIFLU   predniSONE 10 MG tablet Commonly known as: DELTASONE       TAKE these medications    aspirin EC 81 MG tablet Take 1 tablet (81 mg total) by mouth daily. Swallow whole. Start taking on: Oct 01, 2022   atorvastatin 40 MG tablet Commonly known as: LIPITOR Take 1 tablet (40 mg total) by mouth daily.  clopidogrel 75 MG tablet Commonly known as: PLAVIX Take 1 tablet (75 mg total) by mouth daily. Take for 21 days along with aspirin-after 21 days stop Plavix and continue aspirin.   empagliflozin 10 MG Tabs tablet Commonly known as: JARDIANCE Take 1 tablet (10 mg total) by mouth daily.   pantoprazole 40 MG tablet Commonly known as: PROTONIX Take 1 tablet (40 mg total) by mouth daily. Start taking on: Oct 01, 2022        Allergies  Allergen Reactions   Penicillins Swelling    Has patient had a PCN reaction causing immediate rash, facial/tongue/throat swelling, SOB or lightheadedness with hypotension: Yes Has patient had a PCN reaction causing severe rash involving mucus membranes or skin necrosis: No Has patient had a PCN reaction that required hospitalization: No Has patient had a PCN reaction occurring within the last 10 years: No If all of the above answers are  "NO", then may proceed with Cephalosporin use.    Childhood allergy.   Almond Oil Swelling   Peanuts [Peanut Oil] Swelling    Facial swelling   Morphine And Related Nausea And Vomiting   Strawberry Extract Other (See Comments)    Dizziness.    Tramadol Nausea Only     Other Procedures/Studies: ECHOCARDIOGRAM COMPLETE  Result Date: 09/30/2022    ECHOCARDIOGRAM REPORT   Patient Name:   Heather Cook Date of Exam: 09/30/2022 Medical Rec #:  [id number]   Height:       63.0 in Accession #:    [id number]  Weight:       194.0 lb Date of Birth:  [birth year]    BSA:          1.909 m Patient Age:    62 years    BP:           124/62 mmHg Patient Gender: F           HR:           69 bpm. Exam Location:  Inpatient Procedure: 2D Echo, Cardiac Doppler and Color Doppler Indications:    Stroke I63.9  History:        Patient has no prior history of Echocardiogram examinations.                 Stroke and TIA; Risk Factors:Diabetes.  Sonographer:    Lucendia Herrlich Referring Phys: [id number] RIPUDEEP K RAI IMPRESSIONS  1. Left ventricular ejection fraction, by estimation, is 60 to 65%. The left ventricle has normal function. The left ventricle has no regional wall motion abnormalities. There is mild left ventricular hypertrophy of the basal-septal segment. Left ventricular diastolic parameters were normal.  2. Right ventricular systolic function is normal. The right ventricular size is normal.  3. The mitral valve is normal in structure. No evidence of mitral valve regurgitation. No evidence of mitral stenosis.  4. The aortic valve is tricuspid. Aortic valve regurgitation is not visualized. No aortic stenosis is present.  5. The inferior vena cava is normal in size with greater than 50% respiratory variability, suggesting right atrial pressure of 3 mmHg. Comparison(s): No prior Echocardiogram. FINDINGS  Left Ventricle: Left ventricular ejection fraction, by estimation, is 60 to 65%. The left ventricle has normal function. The left  ventricle has no regional wall motion abnormalities. The left ventricular internal cavity size was normal in size. There is  mild left ventricular hypertrophy of the basal-septal segment. Left ventricular diastolic parameters were normal. Right Ventricle: The right ventricular size is normal. Right ventricular  systolic function is normal. Left Atrium: Left atrial size was normal in size. Right Atrium: Right atrial size was normal in size. Pericardium: There is no evidence of pericardial effusion. Mitral Valve: The mitral valve is normal in structure. No evidence of mitral valve regurgitation. No evidence of mitral valve stenosis. Tricuspid Valve: The tricuspid valve is normal in structure. Tricuspid valve regurgitation is trivial. No evidence of tricuspid stenosis. Aortic Valve: The aortic valve is tricuspid. Aortic valve regurgitation is not visualized. No aortic stenosis is present. Aortic valve peak gradient measures 6.1 mmHg. Pulmonic Valve: The pulmonic valve was normal in structure. Pulmonic valve regurgitation is trivial. No evidence of pulmonic stenosis. Aorta: The aortic root is normal in size and structure. Venous: The inferior vena cava is normal in size with greater than 50% respiratory variability, suggesting right atrial pressure of 3 mmHg. IAS/Shunts: No atrial level shunt detected by color flow Doppler.  LEFT VENTRICLE PLAX 2D LVIDd:         3.10 cm   Diastology LVIDs:         2.10 cm   LV e' medial:    10.90 cm/s LV PW:         1.10 cm   LV E/e' medial:  7.0 LV IVS:        1.30 cm   LV e' lateral:   12.10 cm/s LVOT diam:     2.20 cm   LV E/e' lateral: 6.3 LV SV:         76 LV SV Index:   40 LVOT Area:     3.80 cm  RIGHT VENTRICLE             IVC RV S prime:     12.05 cm/s  IVC diam: 1.10 cm TAPSE (M-mode): 1.5 cm LEFT ATRIUM             Index        RIGHT ATRIUM          Index LA diam:        3.10 cm 1.62 cm/m   RA Area:     9.09 cm LA Vol (A2C):   20.1 ml 10.53 ml/m  RA Volume:   16.30 ml 8.54  ml/m LA Vol (A4C):   23.2 ml 12.15 ml/m LA Biplane Vol: 21.8 ml 11.42 ml/m  AORTIC VALVE AV Area (Vmax): 2.88 cm AV Vmax:        123.00 cm/s AV Peak Grad:   6.1 mmHg LVOT Vmax:      93.27 cm/s LVOT Vmean:     62.567 cm/s LVOT VTI:       0.199 m  AORTA Ao Root diam: 3.00 cm Ao Asc diam:  3.00 cm MITRAL VALVE MV Area (PHT): 3.72 cm    SHUNTS MV Decel Time: 204 msec    Systemic VTI:  0.20 m MV E velocity: 76.00 cm/s  Systemic Diam: 2.20 cm MV A velocity: 87.00 cm/s MV E/A ratio:  0.87 Olga Millers MD Electronically signed by Olga Millers MD Signature Date/Time: 09/30/2022/10:28:32 AM    Final    CT ANGIO HEAD NECK W WO CM  Result Date: 09/29/2022 CLINICAL DATA:  Acute neurologic deficit EXAM: CT ANGIOGRAPHY HEAD AND NECK WITH AND WITHOUT CONTRAST TECHNIQUE: Multidetector CT imaging of the head and neck was performed using the standard protocol during bolus administration of intravenous contrast. Multiplanar CT image reconstructions and MIPs were obtained to evaluate the vascular anatomy. Carotid stenosis measurements (when applicable) are obtained utilizing NASCET  criteria, using the distal internal carotid diameter as the denominator. RADIATION DOSE REDUCTION: This exam was performed according to the departmental dose-optimization program which includes automated exposure control, adjustment of the mA and/or kV according to patient size and/or use of iterative reconstruction technique. CONTRAST:  75mL OMNIPAQUE IOHEXOL 350 MG/ML SOLN COMPARISON:  None Available. FINDINGS: CT HEAD FINDINGS Brain: There is no mass, hemorrhage or extra-axial collection. The size and configuration of the ventricles and extra-axial CSF spaces are normal. There is no acute or chronic infarction. The brain parenchyma is normal. Skull: The visualized skull base, calvarium and extracranial soft tissues are normal. Sinuses/Orbits: No fluid levels or advanced mucosal thickening of the visualized paranasal sinuses. No mastoid or middle  ear effusion. The orbits are normal. CTA NECK FINDINGS SKELETON: There is no bony spinal canal stenosis. No lytic or blastic lesion. OTHER NECK: Normal pharynx, larynx and major salivary glands. No cervical lymphadenopathy. Unremarkable thyroid gland. UPPER CHEST: No pneumothorax or pleural effusion. No nodules or masses. AORTIC ARCH: There is no calcific atherosclerosis of the aortic arch. There is no aneurysm, dissection or hemodynamically significant stenosis of the visualized portion of the aorta. Conventional 3 vessel aortic branching pattern. The visualized proximal subclavian arteries are widely patent. RIGHT CAROTID SYSTEM: Normal without aneurysm, dissection or stenosis. LEFT CAROTID SYSTEM: Normal without aneurysm, dissection or stenosis. VERTEBRAL ARTERIES: Left dominant configuration. Both origins are clearly patent. There is no dissection, occlusion or flow-limiting stenosis to the skull base (V1-V3 segments). CTA HEAD FINDINGS POSTERIOR CIRCULATION: --Vertebral arteries: Normal V4 segments. --Inferior cerebellar arteries: Normal. --Basilar artery: Normal. --Superior cerebellar arteries: Normal. --Posterior cerebral arteries (PCA): Normal. ANTERIOR CIRCULATION: --Intracranial internal carotid arteries: Normal. --Anterior cerebral arteries (ACA): Normal. Both A1 segments are present. Patent anterior communicating artery (a-comm). --Middle cerebral arteries (MCA): Normal. VENOUS SINUSES: As permitted by contrast timing, patent. ANATOMIC VARIANTS: None Review of the MIP images confirms the above findings. IMPRESSION: Normal CTA of the head and neck. Electronically Signed   By: Deatra Robinson M.D.   On: 09/29/2022 21:06   CT HEAD CODE STROKE WO CONTRAST  Result Date: 09/29/2022 CLINICAL DATA:  Code stroke. Neuro deficit, acute, stroke suspected right numbness EXAM: CT HEAD WITHOUT CONTRAST TECHNIQUE: Contiguous axial images were obtained from the base of the skull through the vertex without intravenous  contrast. RADIATION DOSE REDUCTION: This exam was performed according to the departmental dose-optimization program which includes automated exposure control, adjustment of the mA and/or kV according to patient size and/or use of iterative reconstruction technique. COMPARISON:  None Available. FINDINGS: Brain: Small, potentially acute perforator infarct in the left basal ganglia. No evidence of acute hemorrhage, hydrocephalus, extra-axial collection or mass lesion/mass effect. Vascular: No hyperdense vessel identified. Skull: No acute fracture. Sinuses/Orbits: Largely clear sinuses.  No acute findings. Other: No mastoid effusions. ASPECTS [facility] Stroke Program Early CT Score) total score (0-10 with 10 being normal): 9 at worst. IMPRESSION: 1. 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 Recommend MRI. 2. No acute hemorrhage. Code stroke imaging results were communicated on 09/29/2022 at 11:18 am to provider [place] via telephone, who verbally acknowledged these results. Electronically Signed   By: Feliberto Harts M.D.   On: 09/29/2022 11:20     TODAY-DAY OF DISCHARGE:  Subjective:   Heather Cook today has no headache,no chest abdominal pain,no new weakness tingling or numbness, feels much better wants to go home today.   Objective:   Blood pressure 124/62, pulse 76, temperature 97.9 F (36.6 C), temperature  source Oral, resp. rate 12, weight 88 kg, last menstrual period 08/12/2014, SpO2 98 %.  Intake/Output Summary (Last 24 hours) at 09/30/2022 1352 Last data filed at 09/29/2022 2200 Gross per 24 hour  Intake 240 ml  Output --  Net 240 ml   Filed Weights   09/29/22 1040  Weight: 88 kg    Exam: Awake Alert, Oriented *3, No new F.N deficits, Normal affect [place].AT,PERRAL Supple Neck,No JVD, No cervical lymphadenopathy appriciated.  Symmetrical Chest wall movement, Good air movement bilaterally, CTAB RRR,No Gallops,Rubs or new Murmurs, No Parasternal Heave +ve  B.Sounds, Abd Soft, Non tender, No organomegaly appriciated, No rebound -guarding or rigidity. No Cyanosis, Clubbing or edema, No new Rash or bruise   PERTINENT RADIOLOGIC STUDIES: ECHOCARDIOGRAM COMPLETE  Result Date: 09/30/2022    ECHOCARDIOGRAM REPORT   Patient Name:   Heather Cook Date of Exam: 09/30/2022 Medical Rec #:  [id number]   Height:       63.0 in Accession #:    [id number]  Weight:       194.0 lb Date of Birth:  02-22-1961    BSA:          1.909 m Patient Age:    62 years    BP:           124/62 mmHg Patient Gender: F           HR:           69 bpm. Exam Location:  Inpatient Procedure: 2D Echo, Cardiac Doppler and Color Doppler Indications:    Stroke I63.9  History:        Patient has no prior history of Echocardiogram examinations.                 Stroke and TIA; Risk Factors:Diabetes.  Sonographer:    Lucendia Herrlich Referring Phys: [id number] RIPUDEEP K RAI IMPRESSIONS  1. Left ventricular ejection fraction, by estimation, is 60 to 65%. The left ventricle has normal function. The left ventricle has no regional wall motion abnormalities. There is mild left ventricular hypertrophy of the basal-septal segment. Left ventricular diastolic parameters were normal.  2. Right ventricular systolic function is normal. The right ventricular size is normal.  3. The mitral valve is normal in structure. No evidence of mitral valve regurgitation. No evidence of mitral stenosis.  4. The aortic valve is tricuspid. Aortic valve regurgitation is not visualized. No aortic stenosis is present.  5. The inferior vena cava is normal in size with greater than 50% respiratory variability, suggesting right atrial pressure of 3 mmHg. Comparison(s): No prior Echocardiogram. FINDINGS  Left Ventricle: Left ventricular ejection fraction, by estimation, is 60 to 65%. The left ventricle has normal function. The left ventricle has no regional wall motion abnormalities. The left ventricular internal cavity size was normal in size. There is   mild left ventricular hypertrophy of the basal-septal segment. Left ventricular diastolic parameters were normal. Right Ventricle: The right ventricular size is normal. Right ventricular systolic function is normal. Left Atrium: Left atrial size was normal in size. Right Atrium: Right atrial size was normal in size. Pericardium: There is no evidence of pericardial effusion. Mitral Valve: The mitral valve is normal in structure. No evidence of mitral valve regurgitation. No evidence of mitral valve stenosis. Tricuspid Valve: The tricuspid valve is normal in structure. Tricuspid valve regurgitation is trivial. No evidence of tricuspid stenosis. Aortic Valve: The aortic valve is tricuspid. Aortic valve regurgitation is not visualized. No aortic stenosis is present. Aortic valve  peak gradient measures 6.1 mmHg. Pulmonic Valve: The pulmonic valve was normal in structure. Pulmonic valve regurgitation is trivial. No evidence of pulmonic stenosis. Aorta: The aortic root is normal in size and structure. Venous: The inferior vena cava is normal in size with greater than 50% respiratory variability, suggesting right atrial pressure of 3 mmHg. IAS/Shunts: No atrial level shunt detected by color flow Doppler.  LEFT VENTRICLE PLAX 2D LVIDd:         3.10 cm   Diastology LVIDs:         2.10 cm   LV e' medial:    10.90 cm/s LV PW:         1.10 cm   LV E/e' medial:  7.0 LV IVS:        1.30 cm   LV e' lateral:   12.10 cm/s LVOT diam:     2.20 cm   LV E/e' lateral: 6.3 LV SV:         76 LV SV Index:   40 LVOT Area:     3.80 cm  RIGHT VENTRICLE             IVC RV S prime:     12.05 cm/s  IVC diam: 1.10 cm TAPSE (M-mode): 1.5 cm LEFT ATRIUM             Index        RIGHT ATRIUM          Index LA diam:        3.10 cm 1.62 cm/m   RA Area:     9.09 cm LA Vol (A2C):   20.1 ml 10.53 ml/m  RA Volume:   16.30 ml 8.54 ml/m LA Vol (A4C):   23.2 ml 12.15 ml/m LA Biplane Vol: 21.8 ml 11.42 ml/m  AORTIC VALVE AV Area (Vmax): 2.88 cm AV Vmax:         123.00 cm/s AV Peak Grad:   6.1 mmHg LVOT Vmax:      93.27 cm/s LVOT Vmean:     62.567 cm/s LVOT VTI:       0.199 m  AORTA Ao Root diam: 3.00 cm Ao Asc diam:  3.00 cm MITRAL VALVE MV Area (PHT): 3.72 cm    SHUNTS MV Decel Time: 204 msec    Systemic VTI:  0.20 m MV E velocity: 76.00 cm/s  Systemic Diam: 2.20 cm MV A velocity: 87.00 cm/s MV E/A ratio:  0.87 Olga Millers MD Electronically signed by Olga Millers MD Signature Date/Time: 09/30/2022/10:28:32 AM    Final    CT ANGIO HEAD NECK W WO CM  Result Date: 09/29/2022 CLINICAL DATA:  Acute neurologic deficit EXAM: CT ANGIOGRAPHY HEAD AND NECK WITH AND WITHOUT CONTRAST TECHNIQUE: Multidetector CT imaging of the head and neck was performed using the standard protocol during bolus administration of intravenous contrast. Multiplanar CT image reconstructions and MIPs were obtained to evaluate the vascular anatomy. Carotid stenosis measurements (when applicable) are obtained utilizing NASCET criteria, using the distal internal carotid diameter as the denominator. RADIATION DOSE REDUCTION: This exam was performed according to the departmental dose-optimization program which includes automated exposure control, adjustment of the mA and/or kV according to patient size and/or use of iterative reconstruction technique. CONTRAST:  75mL OMNIPAQUE IOHEXOL 350 MG/ML SOLN COMPARISON:  None Available. FINDINGS: CT HEAD FINDINGS Brain: There is no mass, hemorrhage or extra-axial collection. The size and configuration of the ventricles and extra-axial CSF spaces are normal. There is no acute or chronic infarction. The  brain parenchyma is normal. Skull: The visualized skull base, calvarium and extracranial soft tissues are normal. Sinuses/Orbits: No fluid levels or advanced mucosal thickening of the visualized paranasal sinuses. No mastoid or middle ear effusion. The orbits are normal. CTA NECK FINDINGS SKELETON: There is no bony spinal canal stenosis. No lytic or blastic  lesion. OTHER NECK: Normal pharynx, larynx and major salivary glands. No cervical lymphadenopathy. Unremarkable thyroid gland. UPPER CHEST: No pneumothorax or pleural effusion. No nodules or masses. AORTIC ARCH: There is no calcific atherosclerosis of the aortic arch. There is no aneurysm, dissection or hemodynamically significant stenosis of the visualized portion of the aorta. Conventional 3 vessel aortic branching pattern. The visualized proximal subclavian arteries are widely patent. RIGHT CAROTID SYSTEM: Normal without aneurysm, dissection or stenosis. LEFT CAROTID SYSTEM: Normal without aneurysm, dissection or stenosis. VERTEBRAL ARTERIES: Left dominant configuration. Both origins are clearly patent. There is no dissection, occlusion or flow-limiting stenosis to the skull base (V1-V3 segments). CTA HEAD FINDINGS POSTERIOR CIRCULATION: --Vertebral arteries: Normal V4 segments. --Inferior cerebellar arteries: Normal. --Basilar artery: Normal. --Superior cerebellar arteries: Normal. --Posterior cerebral arteries (PCA): Normal. ANTERIOR CIRCULATION: --Intracranial internal carotid arteries: Normal. --Anterior cerebral arteries (ACA): Normal. Both A1 segments are present. Patent anterior communicating artery (a-comm). --Middle cerebral arteries (MCA): Normal. VENOUS SINUSES: As permitted by contrast timing, patent. ANATOMIC VARIANTS: None Review of the MIP images confirms the above findings. IMPRESSION: Normal CTA of the head and neck. Electronically Signed   By: Deatra Robinson M.D.   On: 09/29/2022 21:06   CT HEAD CODE STROKE WO CONTRAST  Result Date: 09/29/2022 CLINICAL DATA:  Code stroke. Neuro deficit, acute, stroke suspected right numbness EXAM: CT HEAD WITHOUT CONTRAST TECHNIQUE: Contiguous axial images were obtained from the base of the skull through the vertex without intravenous contrast. RADIATION DOSE REDUCTION: This exam was performed according to the departmental dose-optimization program which  includes automated exposure control, adjustment of the mA and/or kV according to patient size and/or use of iterative reconstruction technique. COMPARISON:  None Available. FINDINGS: Brain: Small, potentially acute perforator infarct in the left basal ganglia. No evidence of acute hemorrhage, hydrocephalus, extra-axial collection or mass lesion/mass effect. Vascular: No hyperdense vessel identified. Skull: No acute fracture. Sinuses/Orbits: Largely clear sinuses.  No acute findings. Other: No mastoid effusions. ASPECTS [facility] Stroke Program Early CT Score) total score (0-10 with 10 being normal): 9 at worst. IMPRESSION: 1. 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 Recommend MRI. 2. No acute hemorrhage. Code stroke imaging results were communicated on 09/29/2022 at 11:18 am to provider [place] via telephone, who verbally acknowledged these results. Electronically Signed   By: Feliberto Harts M.D.   On: 09/29/2022 11:20     PERTINENT LAB RESULTS: CBC: Recent Labs    09/29/22 1750 09/30/22 0547  WBC 6.4 5.0  HGB 12.6 12.1  HCT 38.8 37.2  PLT 219 223   CMET CMP     Component Value Date/Time   NA 134 (L) 09/30/2022 0941   K 3.9 09/30/2022 0941   CL 104 09/30/2022 0941   CO2 23 09/30/2022 0941   GLUCOSE 206 (H) 09/30/2022 0941   BUN 7 (L) 09/30/2022 0941   CREATININE 0.87 09/30/2022 0941   CALCIUM 8.7 (L) 09/30/2022 0941   PROT 7.9 09/29/2022 1133   ALBUMIN 4.2 09/29/2022 1133   AST 16 09/29/2022 1133   ALT 16 09/29/2022 1133   ALKPHOS 80 09/29/2022 1133   BILITOT 0.4 09/29/2022 1133   GFRNONAA >60 09/30/2022 0941  GFRAA >60 02/28/2017 2223    GFR CrCl cannot be calculated (Unknown ideal weight.). No results for input(s): "LIPASE", "AMYLASE" in the last 72 hours. No results for input(s): "CKTOTAL", "CKMB", "CKMBINDEX", "TROPONINI" in the last 72 hours. Invalid input(s): "POCBNP" No results for input(s): "DDIMER" in the last 72 hours. Recent Labs     09/29/22 1750  HGBA1C 8.9*   Recent Labs    09/30/22 0941  CHOL 198  HDL 43  LDLCALC 136*  TRIG 94  CHOLHDL 4.6   No results for input(s): "TSH", "T4TOTAL", "T3FREE", "THYROIDAB" in the last 72 hours.  Invalid input(s): "FREET3" No results for input(s): "VITAMINB12", "FOLATE", "FERRITIN", "TIBC", "IRON", "RETICCTPCT" in the last 72 hours. Coags: Recent Labs    09/29/22 1133  INR 1.1   Microbiology: No results found for this or any previous visit (from the past 240 hour(s)).  FURTHER DISCHARGE INSTRUCTIONS:  Get Medicines reviewed and adjusted: Please take all your medications with you for your next visit with your Primary MD  Laboratory/radiological data: Please request your Primary MD to go over all hospital tests and procedure/radiological results at the follow up, please ask your Primary MD to get all Hospital records sent to his/her office.  In some cases, they will be blood work, cultures and biopsy results pending at the time of your discharge. Please request that your primary care M.D. goes through all the records of your hospital data and follows up on these results.  Also Note the following: If you experience worsening of your admission symptoms, develop shortness of breath, life threatening emergency, suicidal or homicidal thoughts you must seek medical attention immediately by calling 911 or calling your MD immediately  if symptoms less severe.  You must read complete instructions/literature along with all the possible adverse reactions/side effects for all the Medicines you take and that have been prescribed to you. Take any new Medicines after you have completely understood and accpet all the possible adverse reactions/side effects.   Do not drive when taking Pain medications or sleeping medications (Benzodaizepines)  Do not take more than prescribed Pain, Sleep and Anxiety Medications. It is not advisable to combine anxiety,sleep and pain medications without  talking with your primary care practitioner  Special Instructions: If you have smoked or chewed Tobacco  in the last 2 yrs please stop smoking, stop any regular Alcohol  and or any Recreational drug use.  Wear Seat belts while driving.  Please note: You were cared for by a hospitalist during your hospital stay. Once you are discharged, your primary care physician will handle any further medical issues. Please note that NO REFILLS for any discharge medications will be authorized once you are discharged, as it is imperative that you return to your primary care physician (or establish a relationship with a primary care physician if you do not have one) for your post hospital discharge needs so that they can reassess your need for medications and monitor your lab values.  Total Time spent coordinating discharge including counseling, education and face to face time equals greater than 30 minutes.  SignedJeoffrey Massed 09/30/2022 1:52 PM

## 2022-09-30 NOTE — Care Management (Signed)
  Transition of Care [facility]) Screening Note   Patient Details  Name: Kayelene Elie Date of Birth: [birth year]   Transition of Care [facility]) CM/SW Contact:    Gordy Clement, RN Phone Number: 09/30/2022, 9:49 AM    Transition of Care [facility]) has reviewed patient . Acute CVA  Patient came from home. We will continue to monitor patient advancement through interdisciplinary progression rounds. If new patient transition needs arise, please place a TOC consult.

## 2022-09-30 NOTE — Inpatient Diabetes Management (Signed)
Inpatient Diabetes Program Recommendations  AACE/ADA: New Consensus Statement on Inpatient Glycemic Control (2015)  Target Ranges:  Prepandial:   less than 140 mg/dL      Peak postprandial:   less than 180 mg/dL (1-2 hours)      Critically ill patients:  140 - 180 mg/dL   Lab Results  Component Value Date   GLUCAP 190 (H) 09/30/2022   HGBA1C 8.9 (H) 09/29/2022    Spoke with pt at bedside ad her daughter regarding A1c of 8.9 and glucose control at home. Pt does not have a PCP at this time. Pt also does not have insurance.  Pt was on metformin at one time however, she had side effects to it. Pt does not have a way to check her glucose at home. Glucose meter kit provided to patient. Discussed current A1c level of 8.9% and described what an A1c is. Discussed glucose and A1c goals for outpatient setting. Stressed to the patient the importance of improving glycemic control to prevent further complications from uncontrolled diabetes. Discussed impact of nutrition, exercise, stress, sickness, and medications on diabetes control.  Discussed carbohydrates, carbohydrate goals per day and meal, along with portion sizes. Encouraged patient to check glucose 2 times per day (fasting and alternating second check) and to keep a log book of glucose readings and insulin taken which will need to be taken to doctor appointments. TOC consult due to no PCP and no insurance.   Thanks,  Christena Deem RN, MSN, [facility] Inpatient Diabetes Coordinator Team Pager [phone] (8a-5p)

## 2022-09-30 NOTE — Progress Notes (Signed)
PROGRESS NOTE        PATIENT DETAILS Name: Heather Cook Age: 62 y.o. Sex: female Date of Birth: 05-19-1961 Admit Date: 09/29/2022 Admitting Physician Emeline General, MD ZOX:[id number], No Pcp Per  Brief Summary: Patient is a 62 y.o.  female with past medical history of diabetes-not on any medications (claims could not tolerate metformin) presented with right arm weakness/numbness.  Significant events: 5/2>> admit to [facility]  Significant studies: 5/2>> CT head: Small probable acute perforator infarct in the left basal ganglia. 5/2>> CTA head/neck: No stenosis/LVO 5/2>> A1c: 8.9 5/3>> lipid panel: Pending 5/3>> echo: Pending  Significant microbiology data: None  Procedures: None  Consults: Neurology  Subjective: Lying comfortably in bed-denies any chest pain or shortness of breath.  Right arm weakness is better.  She claims she is "not a medication person".  Claims that she could not tolerate metformin in the past.  She is very hesitant to try new diabetic medications or other medications.  Extensive discussion done at bedside regarding importance of reducing her risk profile to prevent future strokes.  Objective: Vitals: Blood pressure 124/62, pulse 76, temperature 97.9 F (36.6 C), temperature source Oral, resp. rate 12, weight 88 kg, last menstrual period 08/12/2014, SpO2 98 %.   Exam: Gen Exam:Alert awake-not in any distress HEENT:atraumatic, normocephalic Chest: B/L clear to auscultation anteriorly CVS:S1S2 regular Abdomen:soft non tender, non distended Extremities:no edema Neurology: Non focal Skin: no rash  Pertinent Labs/Radiology:    Latest Ref Rng & Units 09/30/2022    5:47 AM 09/29/2022    5:50 PM 09/29/2022   11:33 AM  CBC  WBC 4.0 - 10.5 K/uL 5.0  6.4  6.2   Hemoglobin 12.0 - 15.0 g/dL 09.8  11.9  14.7   Hematocrit 36.0 - 46.0 % 37.2  38.8  43.7   Platelets 150 - 400 K/uL 223  219  230     Lab Results  Component Value Date   NA 139  09/29/2022   K 4.2 09/29/2022   CL 102 09/29/2022   CO2 28 09/29/2022     Assessment/Plan: Acute CVA Likely small vessel disease  Right arm weakness has resolved Workup as above Spoke with MRI tech 5/3-they will reattempt MRI brain-he sees no  metal in the patient's hair on CT imaging that is a contraindication Continue antiplatelets Await further recommendations from stroke MD. Long discussion with patient this morning-she is very hesitant to start new medication-explained that she will likely need antiplatelet/statin/better control of her diabetes in order to prevent future strokes.  She is aware of life-threatening/life disabling effects of catastrophic CVA.  DM-2 Very hesitant to start new medications-apparently intolerant to metformin in the past-if she is agreeable-will start Tradjenta.  GERD/PUD PPI  Obesity: Estimated body mass index is 34.37 kg/m as calculated from the following:   Height as of 03/02/21: 5\' 3"  (1.6 m).   Weight as of this encounter: 88 kg.   Code status:   Code Status: Full Code   DVT Prophylaxis: enoxaparin (LOVENOX) injection 40 mg Start: 09/29/22 1800    Family Communication: None at bedside   Disposition Plan: Status is: Observation The patient will require care spanning > 2 midnights and should be moved to inpatient because: Severity of illness-awaiting further work up   Planned Discharge Destination:Home   Diet: Diet Order  Diet Carb Modified Fluid consistency: Thin; Room service appropriate? No  Diet effective now                     Antimicrobial agents: Anti-infectives (From admission, onward)    None        MEDICATIONS: Scheduled Meds:   stroke: early stages of recovery book   Does not apply Once   aspirin EC  81 mg Oral Daily   enoxaparin (LOVENOX) injection  40 mg Subcutaneous Q24H   insulin aspart  0-5 Units Subcutaneous QHS   insulin aspart  0-9 Units Subcutaneous TID WC   pantoprazole  40 mg  Oral Daily   Continuous Infusions:  sodium chloride 75 mL/hr at 09/29/22 1844   PRN Meds:.acetaminophen **OR** acetaminophen (TYLENOL) oral liquid 160 mg/5 mL **OR** acetaminophen, senna-docusate   I have personally reviewed following labs and imaging studies  LABORATORY DATA: CBC: Recent Labs  Lab 09/29/22 1133 09/29/22 1750 09/30/22 0547  WBC 6.2 6.4 5.0  NEUTROABS 3.4  --   --   HGB 13.6 12.6 12.1  HCT 43.7 38.8 37.2  MCV 85.0 82.9 82.5  PLT 230 219 223    Basic Metabolic Panel: Recent Labs  Lab 09/29/22 1133 09/29/22 1750  NA 139  --   K 4.2  --   CL 102  --   CO2 28  --   GLUCOSE 188*  --   BUN 10  --   CREATININE 0.89 0.76  CALCIUM 9.8  --     GFR: CrCl cannot be calculated (Unknown ideal weight.).  Liver Function Tests: Recent Labs  Lab 09/29/22 1133  AST 16  ALT 16  ALKPHOS 80  BILITOT 0.4  PROT 7.9  ALBUMIN 4.2   No results for input(s): "LIPASE", "AMYLASE" in the last 168 hours. No results for input(s): "AMMONIA" in the last 168 hours.  Coagulation Profile: Recent Labs  Lab 09/29/22 1133  INR 1.1    Cardiac Enzymes: No results for input(s): "CKTOTAL", "CKMB", "CKMBINDEX", "TROPONINI" in the last 168 hours.  BNP (last 3 results) No results for input(s): "PROBNP" in the last 8760 hours.  Lipid Profile: No results for input(s): "CHOL", "HDL", "LDLCALC", "TRIG", "CHOLHDL", "LDLDIRECT" in the last 72 hours.  Thyroid Function Tests: No results for input(s): "TSH", "T4TOTAL", "FREET4", "T3FREE", "THYROIDAB" in the last 72 hours.  Anemia Panel: No results for input(s): "VITAMINB12", "FOLATE", "FERRITIN", "TIBC", "IRON", "RETICCTPCT" in the last 72 hours.  Urine analysis:    Component Value Date/Time   COLORURINE YELLOW 09/29/2022 1133   APPEARANCEUR CLEAR 09/29/2022 1133   LABSPEC 1.022 09/29/2022 1133   PHURINE 5.5 09/29/2022 1133   GLUCOSEU NEGATIVE 09/29/2022 1133   HGBUR NEGATIVE 09/29/2022 1133   BILIRUBINUR NEGATIVE  09/29/2022 1133   KETONESUR NEGATIVE 09/29/2022 1133   PROTEINUR NEGATIVE 09/29/2022 1133   UROBILINOGEN 1.0 03/21/2014 2055   NITRITE POSITIVE (A) 09/29/2022 1133   LEUKOCYTESUR NEGATIVE 09/29/2022 1133    Sepsis Labs: Lactic Acid, Venous    Component Value Date/Time   LATICACIDVEN 1.89 03/21/2014 2143    MICROBIOLOGY: No results found for this or any previous visit (from the past 240 hour(s)).  RADIOLOGY STUDIES/RESULTS: CT ANGIO HEAD NECK W WO CM  Result Date: 09/29/2022 CLINICAL DATA:  Acute neurologic deficit EXAM: CT ANGIOGRAPHY HEAD AND NECK WITH AND WITHOUT CONTRAST TECHNIQUE: Multidetector CT imaging of the head and neck was performed using the standard protocol during bolus administration of intravenous contrast. Multiplanar CT image reconstructions and MIPs  were obtained to evaluate the vascular anatomy. Carotid stenosis measurements (when applicable) are obtained utilizing NASCET criteria, using the distal internal carotid diameter as the denominator. RADIATION DOSE REDUCTION: This exam was performed according to the departmental dose-optimization program which includes automated exposure control, adjustment of the mA and/or kV according to patient size and/or use of iterative reconstruction technique. CONTRAST:  75mL OMNIPAQUE IOHEXOL 350 MG/ML SOLN COMPARISON:  None Available. FINDINGS: CT HEAD FINDINGS Brain: There is no mass, hemorrhage or extra-axial collection. The size and configuration of the ventricles and extra-axial CSF spaces are normal. There is no acute or chronic infarction. The brain parenchyma is normal. Skull: The visualized skull base, calvarium and extracranial soft tissues are normal. Sinuses/Orbits: No fluid levels or advanced mucosal thickening of the visualized paranasal sinuses. No mastoid or middle ear effusion. The orbits are normal. CTA NECK FINDINGS SKELETON: There is no bony spinal canal stenosis. No lytic or blastic lesion. OTHER NECK: Normal pharynx,  larynx and major salivary glands. No cervical lymphadenopathy. Unremarkable thyroid gland. UPPER CHEST: No pneumothorax or pleural effusion. No nodules or masses. AORTIC ARCH: There is no calcific atherosclerosis of the aortic arch. There is no aneurysm, dissection or hemodynamically significant stenosis of the visualized portion of the aorta. Conventional 3 vessel aortic branching pattern. The visualized proximal subclavian arteries are widely patent. RIGHT CAROTID SYSTEM: Normal without aneurysm, dissection or stenosis. LEFT CAROTID SYSTEM: Normal without aneurysm, dissection or stenosis. VERTEBRAL ARTERIES: Left dominant configuration. Both origins are clearly patent. There is no dissection, occlusion or flow-limiting stenosis to the skull base (V1-V3 segments). CTA HEAD FINDINGS POSTERIOR CIRCULATION: --Vertebral arteries: Normal V4 segments. --Inferior cerebellar arteries: Normal. --Basilar artery: Normal. --Superior cerebellar arteries: Normal. --Posterior cerebral arteries (PCA): Normal. ANTERIOR CIRCULATION: --Intracranial internal carotid arteries: Normal. --Anterior cerebral arteries (ACA): Normal. Both A1 segments are present. Patent anterior communicating artery (a-comm). --Middle cerebral arteries (MCA): Normal. VENOUS SINUSES: As permitted by contrast timing, patent. ANATOMIC VARIANTS: None Review of the MIP images confirms the above findings. IMPRESSION: Normal CTA of the head and neck. Electronically Signed   By: Deatra Robinson M.D.   On: 09/29/2022 21:06   CT HEAD CODE STROKE WO CONTRAST  Result Date: 09/29/2022 CLINICAL DATA:  Code stroke. Neuro deficit, acute, stroke suspected right numbness EXAM: CT HEAD WITHOUT CONTRAST TECHNIQUE: Contiguous axial images were obtained from the base of the skull through the vertex without intravenous contrast. RADIATION DOSE REDUCTION: This exam was performed according to the departmental dose-optimization program which includes automated exposure control,  adjustment of the mA and/or kV according to patient size and/or use of iterative reconstruction technique. COMPARISON:  None Available. FINDINGS: Brain: Small, potentially acute perforator infarct in the left basal ganglia. No evidence of acute hemorrhage, hydrocephalus, extra-axial collection or mass lesion/mass effect. Vascular: No hyperdense vessel identified. Skull: No acute fracture. Sinuses/Orbits: Largely clear sinuses.  No acute findings. Other: No mastoid effusions. ASPECTS [facility] Stroke Program Early CT Score) total score (0-10 with 10 being normal): 9 at worst. IMPRESSION: 1. 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 Recommend MRI. 2. No acute hemorrhage. Code stroke imaging results were communicated on 09/29/2022 at 11:18 am to provider [place] via telephone, who verbally acknowledged these results. Electronically Signed   By: Feliberto Harts M.D.   On: 09/29/2022 11:20     LOS: 0 days   Jeoffrey Massed, MD  [facility]    To contact the attending provider between 7A-7P or the covering provider during after hours 7P-7A,  please log into the web site [email or web address] and access using universal [place] password for that web site. If you do not have the password, please call the hospital operator.  09/30/2022, 9:48 AM

## 2022-09-30 NOTE — TOC Progression Note (Signed)
Transition of Care [facility]) - Progression Note    Patient Details  Name: Heather Cook MRN: [id number] Date of Birth: May 28, 1961  Transition of Care [facility]) CM/SW Contact  Gordy Clement, RN Phone Number: 09/30/2022, 1:31 PM  Clinical Narrative:     CM has put in a [facility] letter for this Patient to assist with DC meds.  A request has also been made to arrange a PCP   TOC will continue to follow patient for any additional discharge needs           Expected Discharge Plan and Services                                               Social Determinants of Health (SDOH) Interventions SDOH Screenings   Tobacco Use: Low Risk  (09/29/2022)    Readmission Risk Interventions     No data to display

## 2022-09-30 NOTE — Evaluation (Signed)
Occupational Therapy Evaluation Patient Details Name: Heather Cook MRN: [id number] DOB: [birth year] Today's Date: 09/30/2022   History of Present Illness 62 y/o F admitted to [facility] on 5/2 for right arm weakness, numbness while driving. 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 PMHx: DM, GERD, PUD   Clinical Impression   Pt is at Ind - Mod I level with ADLs and mobility. Pt required no AD during ambulation and had no LOB walking to bathroom, transferring to toilet or with ADLs standing. Pt demos no UE weakness or incoordination. Pt reports no impairments with vision. All education completed and no further acute OT services are indicated at this time      Recommendations for follow up therapy are one component of a multi-disciplinary discharge planning process, led by the attending physician.  Recommendations may be updated based on patient status, additional functional criteria and insurance authorization.   Assistance Recommended at Discharge PRN  Patient can return home with the following Assistance with cooking/housework    Functional Status Assessment  Patient has not had a recent decline in their functional status  Equipment Recommendations  None recommended by OT    Recommendations for Other Services       Precautions / Restrictions Precautions Precautions: Fall Restrictions Weight Bearing Restrictions: No      Mobility Bed Mobility Overal bed mobility: Modified Independent                  Transfers Overall transfer level: Modified independent Equipment used: None               General transfer comment: no LOB walking to bathroom or transfers to and from toilet      Balance Overall balance assessment: No apparent balance deficits (not formally assessed)                                         ADL either performed or assessed with clinical judgement   ADL Overall ADL's :  Independent;At baseline                                             Vision Ability to See in Adequate Light: 0 Adequate Patient Visual Report: No change from baseline (pt reports no double or blurred vision, no changes)       Perception     Praxis      Pertinent Vitals/Pain Pain Assessment Pain Assessment: 0-10 Pain Score: 5  Pain Location: headache Pain Descriptors / Indicators: Aching Pain Intervention(s): Monitored during session     Hand Dominance Right   Extremity/Trunk Assessment Upper Extremity Assessment Upper Extremity Assessment: Overall WFL for tasks assessed   Lower Extremity Assessment Lower Extremity Assessment: Defer to PT evaluation   Cervical / Trunk Assessment Cervical / Trunk Assessment: Normal   Communication Communication Communication: No difficulties   Cognition Arousal/Alertness: Awake/alert Behavior During Therapy: WFL for tasks assessed/performed Overall Cognitive Status: Within Functional Limits for tasks assessed                                       General Comments  VSS on room air    Exercises  Shoulder Instructions      Home Living Family/patient expects to be discharged to:: Private residence Living Arrangements: Alone Available Help at Discharge: Family;Available PRN/intermittently Type of Home: Apartment Home Access: Stairs to enter Entrance Stairs-Number of Steps: 32, lives in 3rd floor Entrance Stairs-Rails: Right;Left;Can reach both Home Layout: One level     Bathroom Shower/Tub: Chief Strategy Officer: Standard Bathroom Accessibility: Yes   Home Equipment: None      Lives With: Alone    Prior Functioning/Environment Prior Level of Function : Independent/Modified Independent;Driving;Working/employed             Mobility Comments: independent at baseline, no falls ADLs Comments: Ind with ADLs, IADLs, home mgt, works, drives        OT Problem List:  Pain      OT Treatment/Interventions:      OT Goals(Current goals can be found in the care plan section) Acute Rehab OT Goals Patient Stated Goal: go home  OT Frequency:      Co-evaluation              AM-PAC OT "6 Clicks" Daily Activity     Outcome Measure Help from another person eating meals?: None Help from another person taking care of personal grooming?: None Help from another person toileting, which includes using toliet, bedpan, or urinal?: None Help from another person bathing (including washing, rinsing, drying)?: None Help from another person to put on and taking off regular upper body clothing?: None Help from another person to put on and taking off regular lower body clothing?: None 6 Click Score: 24   End of Session Equipment Utilized During Treatment: Gait belt  Activity Tolerance: Patient tolerated treatment well Patient left: in bed  OT Visit Diagnosis: Other abnormalities of gait and mobility (R26.89)                Time: 0454-0981 OT Time Calculation (min): 21 min Charges:  OT General Charges $OT Visit: 1 Visit OT Evaluation $OT Eval Low Complexity: 1 Low    Galen Manila 09/30/2022, 2:31 PM

## 2022-09-30 NOTE — Progress Notes (Addendum)
STROKE TEAM PROGRESS NOTE   SUBJECTIVE (INTERVAL HISTORY) Her daughters are at the bedside.  Overall her condition is completely resolved.  Per patient, she was driving yesterday had right arm weakness and numbness lasted about 8 hours and resolved.  When she was sent to ER she started having headache on the top of her head also resolved currently.  She stated that she had headache average twice a months, on the top of her head, similar to yesterday, contribute to stress, never diagnosed with migraine in the past.  OBJECTIVE Temp:  [97.9 F (36.6 C)-98.6 F (37 C)] 97.9 F (36.6 C) (05/03 0753) Pulse Rate:  [74-125] 76 (05/03 0753) Cardiac Rhythm: Normal sinus rhythm (05/03 0700) Resp:  [12-19] 12 (05/03 0753) BP: (107-142)/(57-70) 124/62 (05/03 0500) SpO2:  [83 %-98 %] 98 % (05/03 0753)  Recent Labs  Lab 09/29/22 1100 09/29/22 2153 09/30/22 0756 09/30/22 1141  GLUCAP 190* 217* 167* 190*   Recent Labs  Lab 09/29/22 1133 09/29/22 1750 09/30/22 0941  NA 139  --  134*  K 4.2  --  3.9  CL 102  --  104  CO2 28  --  23  GLUCOSE 188*  --  206*  BUN 10  --  7*  CREATININE 0.89 0.76 0.87  CALCIUM 9.8  --  8.7*   Recent Labs  Lab 09/29/22 1133  AST 16  ALT 16  ALKPHOS 80  BILITOT 0.4  PROT 7.9  ALBUMIN 4.2   Recent Labs  Lab 09/29/22 1133 09/29/22 1750 09/30/22 0547  WBC 6.2 6.4 5.0  NEUTROABS 3.4  --   --   HGB 13.6 12.6 12.1  HCT 43.7 38.8 37.2  MCV 85.0 82.9 82.5  PLT 230 219 223   No results for input(s): "CKTOTAL", "CKMB", "CKMBINDEX", "TROPONINI" in the last 168 hours. Recent Labs    09/29/22 1133  LABPROT 13.6  INR 1.1   Recent Labs    09/29/22 1133  COLORURINE YELLOW  LABSPEC 1.022  PHURINE 5.5  GLUCOSEU NEGATIVE  HGBUR NEGATIVE  BILIRUBINUR NEGATIVE  KETONESUR NEGATIVE  PROTEINUR NEGATIVE  NITRITE POSITIVE*  LEUKOCYTESUR NEGATIVE       Component Value Date/Time   CHOL 198 09/30/2022 0941   TRIG 94 09/30/2022 0941   HDL 43 09/30/2022  0941   CHOLHDL 4.6 09/30/2022 0941   VLDL 19 09/30/2022 0941   LDLCALC 136 (H) 09/30/2022 0941   Lab Results  Component Value Date   HGBA1C 8.9 (H) 09/29/2022      Component Value Date/Time   LABOPIA NONE DETECTED 09/29/2022 1133   COCAINSCRNUR NONE DETECTED 09/29/2022 1133   LABBENZ NONE DETECTED 09/29/2022 1133   AMPHETMU NONE DETECTED 09/29/2022 1133   THCU NONE DETECTED 09/29/2022 1133   LABBARB NONE DETECTED 09/29/2022 1133    Recent Labs  Lab 09/29/22 1133  ETH 11*    I have personally reviewed the radiological images below and agree with the radiology interpretations.  MR BRAIN WO CONTRAST  Result Date: 09/30/2022 CLINICAL DATA:  Neuro deficit, acute, stroke suspected. Acute onset of right arm weakness/numbness. EXAM: MRI HEAD WITHOUT CONTRAST TECHNIQUE: Multiplanar, multiecho pulse sequences of the brain and surrounding structures were obtained without intravenous contrast. COMPARISON:  Head CT and CTA head/neck 09/29/2022. FINDINGS: Brain: No acute infarct or hemorrhage. The previously questioned abnormality in the left basal ganglia is resolved to represent a benign dilated perivascular space. No hydrocephalus or extra-axial collection. No mass or midline shift. No foci of abnormal susceptibility. Vascular: Normal  flow voids. Skull and upper cervical spine: Normal marrow signal. Sinuses/Orbits: Unremarkable. Other: None. IMPRESSION: No acute intracranial process. The previously questioned abnormality in the left basal ganglia is resolved to represent a benign dilated perivascular space. Electronically Signed   By: Orvan Falconer M.D.   On: 09/30/2022 14:22   ECHOCARDIOGRAM COMPLETE  Result Date: 09/30/2022    ECHOCARDIOGRAM REPORT   Patient Name:   Heather Cook Date of Exam: 09/30/2022 Medical Rec #:  [id number]   Height:       63.0 in Accession #:    [id number]  Weight:       194.0 lb Date of Birth:  March 25, 1961    BSA:          1.909 m Patient Age:    62 years    BP:            124/62 mmHg Patient Gender: F           HR:           69 bpm. Exam Location:  Inpatient Procedure: 2D Echo, Cardiac Doppler and Color Doppler Indications:    Stroke I63.9  History:        Patient has no prior history of Echocardiogram examinations.                 Stroke and TIA; Risk Factors:Diabetes.  Sonographer:    Lucendia Herrlich Referring Phys: [id number] RIPUDEEP K RAI IMPRESSIONS  1. Left ventricular ejection fraction, by estimation, is 60 to 65%. The left ventricle has normal function. The left ventricle has no regional wall motion abnormalities. There is mild left ventricular hypertrophy of the basal-septal segment. Left ventricular diastolic parameters were normal.  2. Right ventricular systolic function is normal. The right ventricular size is normal.  3. The mitral valve is normal in structure. No evidence of mitral valve regurgitation. No evidence of mitral stenosis.  4. The aortic valve is tricuspid. Aortic valve regurgitation is not visualized. No aortic stenosis is present.  5. The inferior vena cava is normal in size with greater than 50% respiratory variability, suggesting right atrial pressure of 3 mmHg. Comparison(s): No prior Echocardiogram. FINDINGS  Left Ventricle: Left ventricular ejection fraction, by estimation, is 60 to 65%. The left ventricle has normal function. The left ventricle has no regional wall motion abnormalities. The left ventricular internal cavity size was normal in size. There is  mild left ventricular hypertrophy of the basal-septal segment. Left ventricular diastolic parameters were normal. Right Ventricle: The right ventricular size is normal. Right ventricular systolic function is normal. Left Atrium: Left atrial size was normal in size. Right Atrium: Right atrial size was normal in size. Pericardium: There is no evidence of pericardial effusion. Mitral Valve: The mitral valve is normal in structure. No evidence of mitral valve regurgitation. No evidence of mitral valve  stenosis. Tricuspid Valve: The tricuspid valve is normal in structure. Tricuspid valve regurgitation is trivial. No evidence of tricuspid stenosis. Aortic Valve: The aortic valve is tricuspid. Aortic valve regurgitation is not visualized. No aortic stenosis is present. Aortic valve peak gradient measures 6.1 mmHg. Pulmonic Valve: The pulmonic valve was normal in structure. Pulmonic valve regurgitation is trivial. No evidence of pulmonic stenosis. Aorta: The aortic root is normal in size and structure. Venous: The inferior vena cava is normal in size with greater than 50% respiratory variability, suggesting right atrial pressure of 3 mmHg. IAS/Shunts: No atrial level shunt detected by color flow Doppler.  LEFT VENTRICLE PLAX 2D LVIDd:  3.10 cm   Diastology LVIDs:         2.10 cm   LV e' medial:    10.90 cm/s LV PW:         1.10 cm   LV E/e' medial:  7.0 LV IVS:        1.30 cm   LV e' lateral:   12.10 cm/s LVOT diam:     2.20 cm   LV E/e' lateral: 6.3 LV SV:         76 LV SV Index:   40 LVOT Area:     3.80 cm  RIGHT VENTRICLE             IVC RV S prime:     12.05 cm/s  IVC diam: 1.10 cm TAPSE (M-mode): 1.5 cm LEFT ATRIUM             Index        RIGHT ATRIUM          Index LA diam:        3.10 cm 1.62 cm/m   RA Area:     9.09 cm LA Vol (A2C):   20.1 ml 10.53 ml/m  RA Volume:   16.30 ml 8.54 ml/m LA Vol (A4C):   23.2 ml 12.15 ml/m LA Biplane Vol: 21.8 ml 11.42 ml/m  AORTIC VALVE AV Area (Vmax): 2.88 cm AV Vmax:        123.00 cm/s AV Peak Grad:   6.1 mmHg LVOT Vmax:      93.27 cm/s LVOT Vmean:     62.567 cm/s LVOT VTI:       0.199 m  AORTA Ao Root diam: 3.00 cm Ao Asc diam:  3.00 cm MITRAL VALVE MV Area (PHT): 3.72 cm    SHUNTS MV Decel Time: 204 msec    Systemic VTI:  0.20 m MV E velocity: 76.00 cm/s  Systemic Diam: 2.20 cm MV A velocity: 87.00 cm/s MV E/A ratio:  0.87 Olga Millers MD Electronically signed by Olga Millers MD Signature Date/Time: 09/30/2022/10:28:32 AM    Final    CT ANGIO HEAD NECK  W WO CM  Result Date: 09/29/2022 CLINICAL DATA:  Acute neurologic deficit EXAM: CT ANGIOGRAPHY HEAD AND NECK WITH AND WITHOUT CONTRAST TECHNIQUE: Multidetector CT imaging of the head and neck was performed using the standard protocol during bolus administration of intravenous contrast. Multiplanar CT image reconstructions and MIPs were obtained to evaluate the vascular anatomy. Carotid stenosis measurements (when applicable) are obtained utilizing NASCET criteria, using the distal internal carotid diameter as the denominator. RADIATION DOSE REDUCTION: This exam was performed according to the departmental dose-optimization program which includes automated exposure control, adjustment of the mA and/or kV according to patient size and/or use of iterative reconstruction technique. CONTRAST:  75mL OMNIPAQUE IOHEXOL 350 MG/ML SOLN COMPARISON:  None Available. FINDINGS: CT HEAD FINDINGS Brain: There is no mass, hemorrhage or extra-axial collection. The size and configuration of the ventricles and extra-axial CSF spaces are normal. There is no acute or chronic infarction. The brain parenchyma is normal. Skull: The visualized skull base, calvarium and extracranial soft tissues are normal. Sinuses/Orbits: No fluid levels or advanced mucosal thickening of the visualized paranasal sinuses. No mastoid or middle ear effusion. The orbits are normal. CTA NECK FINDINGS SKELETON: There is no bony spinal canal stenosis. No lytic or blastic lesion. OTHER NECK: Normal pharynx, larynx and major salivary glands. No cervical lymphadenopathy. Unremarkable thyroid gland. UPPER CHEST: No pneumothorax or pleural effusion. No nodules or  masses. AORTIC ARCH: There is no calcific atherosclerosis of the aortic arch. There is no aneurysm, dissection or hemodynamically significant stenosis of the visualized portion of the aorta. Conventional 3 vessel aortic branching pattern. The visualized proximal subclavian arteries are widely patent. RIGHT  CAROTID SYSTEM: Normal without aneurysm, dissection or stenosis. LEFT CAROTID SYSTEM: Normal without aneurysm, dissection or stenosis. VERTEBRAL ARTERIES: Left dominant configuration. Both origins are clearly patent. There is no dissection, occlusion or flow-limiting stenosis to the skull base (V1-V3 segments). CTA HEAD FINDINGS POSTERIOR CIRCULATION: --Vertebral arteries: Normal V4 segments. --Inferior cerebellar arteries: Normal. --Basilar artery: Normal. --Superior cerebellar arteries: Normal. --Posterior cerebral arteries (PCA): Normal. ANTERIOR CIRCULATION: --Intracranial internal carotid arteries: Normal. --Anterior cerebral arteries (ACA): Normal. Both A1 segments are present. Patent anterior communicating artery (a-comm). --Middle cerebral arteries (MCA): Normal. VENOUS SINUSES: As permitted by contrast timing, patent. ANATOMIC VARIANTS: None Review of the MIP images confirms the above findings. IMPRESSION: Normal CTA of the head and neck. Electronically Signed   By: Deatra Robinson M.D.   On: 09/29/2022 21:06   CT HEAD CODE STROKE WO CONTRAST  Result Date: 09/29/2022 CLINICAL DATA:  Code stroke. Neuro deficit, acute, stroke suspected right numbness EXAM: CT HEAD WITHOUT CONTRAST TECHNIQUE: Contiguous axial images were obtained from the base of the skull through the vertex without intravenous contrast. RADIATION DOSE REDUCTION: This exam was performed according to the departmental dose-optimization program which includes automated exposure control, adjustment of the mA and/or kV according to patient size and/or use of iterative reconstruction technique. COMPARISON:  None Available. FINDINGS: Brain: Small, potentially acute perforator infarct in the left basal ganglia. No evidence of acute hemorrhage, hydrocephalus, extra-axial collection or mass lesion/mass effect. Vascular: No hyperdense vessel identified. Skull: No acute fracture. Sinuses/Orbits: Largely clear sinuses.  No acute findings. Other: No  mastoid effusions. ASPECTS [facility] Stroke Program Early CT Score) total score (0-10 with 10 being normal): 9 at worst. IMPRESSION: 1. 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 Recommend MRI. 2. No acute hemorrhage. Code stroke imaging results were communicated on 09/29/2022 at 11:18 am to provider [place] via telephone, who verbally acknowledged these results. Electronically Signed   By: Feliberto Harts M.D.   On: 09/29/2022 11:20     PHYSICAL EXAM  Temp:  [97.9 F (36.6 C)-98.6 F (37 C)] 97.9 F (36.6 C) (05/03 0753) Pulse Rate:  [74-125] 76 (05/03 0753) Resp:  [12-19] 12 (05/03 0753) BP: (107-142)/(57-70) 124/62 (05/03 0500) SpO2:  [83 %-98 %] 98 % (05/03 0753)  General - Well nourished, well developed, in no apparent distress.  Ophthalmologic - fundi not visualized due to noncooperation.  Cardiovascular - Regular rhythm and rate.  Mental Status -  Level of arousal and orientation to time, place, and person were intact. Language including expression, naming, repetition, comprehension was assessed and found intact. Attention span and concentration were normal. Fund of Knowledge was assessed and was intact.  Cranial Nerves II - XII - II - Visual field intact OU. III, IV, VI - Extraocular movements intact. V - Facial sensation intact bilaterally. VII - Facial movement intact bilaterally. VIII - Hearing & vestibular intact bilaterally. X - Palate elevates symmetrically. XI - Chin turning & shoulder shrug intact bilaterally. XII - Tongue protrusion intact.  Motor Strength - The patient's strength was normal in all extremities and pronator drift was absent.  Bulk was normal and fasciculations were absent.   Motor Tone - Muscle tone was assessed at the neck and appendages and was normal.  Reflexes - The patient's reflexes were symmetrical in all extremities and she had no pathological reflexes.  Sensory - Light touch, temperature/pinprick were  assessed and were symmetrical.    Coordination - The patient had normal movements in the hands and feet with no ataxia or dysmetria.  Tremor was absent.  Gait and Station - deferred.   ASSESSMENT/PLAN Ms. Heather Cook is a 62 y.o. female with history of diabetes, stomach ulcer, sciatica admitted for right arm numbness and weakness, lasted 8 hours with headache. No tPA given due to outside window.    Left brain TIA versus complicated migraine CT questionable left BG small infarct CTA head and neck unremarkable MRI no acute infarct, no chronic infarct on MRI 2D Echo EF 60 to 65% LDL 136 HgbA1c 8.9 UDS negative Lovenox for VTE prophylaxis No antithrombotic prior to admission, now on aspirin 81 mg daily and clopidogrel 75 mg daily DAPT for 3 weeks and then aspirin. Patient counseled to be compliant with her antithrombotic medications Ongoing aggressive stroke risk factor management Therapy recommendations: None Disposition: Home today  Diabetes HgbA1c 8.9 goal < 7.0 Uncontrolled CBG monitoring SSI DM education and close PCP follow up  Hypertension Stable Long term BP goal normotensive  Hyperlipidemia Home meds: None LDL 136, goal < 70 Now on Lipitor 40 Continue statin at discharge  Headache has headache average twice a months, on the top of her head, similar to yesterday, contribute to stress, never diagnosed with migraine in the past. This time headache started after right arm numbness and weakness, headache similar to previous headaches Follow-up with [facility] as outpatient  Other Stroke Risk Factors Obesity, Body mass index is 34.37 kg/m.   Other Active Problems Stomach ulcer but no GI bleeding Sciatica  Hospital day # 0  Neurology will sign off. Please call with questions. Pt will follow up with stroke clinic NP at [facility] in about 4 weeks. Thanks for the consult.   Marvel Plan, MD PhD Stroke Neurology 09/30/2022 3:17 PM    To contact Stroke Continuity provider,  please refer to [email or web address]. After hours, contact General Neurology

## 2022-09-30 NOTE — Progress Notes (Signed)
Echocardiogram 2D Echocardiogram has been performed.  Lucendia Herrlich 09/30/2022, 9:37 AM

## 2022-09-30 NOTE — Progress Notes (Signed)
Pt admitted for acute CVA, reports persisting headache present since admission. Ongoing vital signs stable, NIH-1. Headache rated at 5/10, tylenol given x2 so far this shift with minimal effects. Dr. Antionette Char notified, new orders noted. Pt offered prn oxycodone, however she stated she wanted to wait because it was "too soon".

## 2022-09-30 NOTE — Evaluation (Signed)
Physical Therapy Evaluation Patient Details Name: Heather Cook MRN: [id number] DOB: 31-Dec-1960 Today's Date: 09/30/2022  History of Present Illness  62 y/o F admitted to [facility] on 5/2 for right arm weakness, numbness while driving. 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 PMHx: DM, GERD, PUD  Clinical Impression  Pt presents today after CVA, but functioning close to or at her baseline. Pt reports all numbness/tingling has resolved, performing bed mobility, transfers, ambulation, and stair negotiation with modI. Pt reports her only symptom is a head ache, able to perform head turns during ambulation without dizziness or increase in symptoms, no LOB noted throughout. Pt denies any changes to her vision, denying any concerns about mobility upon discharge. Pt with no further benefit from skilled acute PT at this time, no current need for PT upon discharge, will sign off.      Recommendations for follow up therapy are one component of a multi-disciplinary discharge planning process, led by the attending physician.  Recommendations may be updated based on patient status, additional functional criteria and insurance authorization.  Follow Up Recommendations       Assistance Recommended at Discharge PRN  Patient can return home with the following  Assist for transportation    Equipment Recommendations None recommended by PT  Recommendations for Other Services       Functional Status Assessment Patient has had a recent decline in their functional status and demonstrates the ability to make significant improvements in function in a reasonable and predictable amount of time.     Precautions / Restrictions Precautions Precautions: Fall Restrictions Weight Bearing Restrictions: No      Mobility  Bed Mobility Overal bed mobility: Modified Independent             General bed mobility comments: HOB elevated     Transfers Overall transfer level: Modified independent Equipment used: None               General transfer comment: no issue standing from bed or toilet    Ambulation/Gait Ambulation/Gait assistance: Modified independent (Device/Increase time) Gait Distance (Feet): 250 Feet Assistive device: None Gait Pattern/deviations: Step-through pattern, Wide base of support, Decreased stride length Gait velocity: grossly WFL     General Gait Details: mild lateral sway with wide BOS but ambulating without LOB and no AD, reports feeling at her baseline ambulation. Pt able to perform head turns, change in gait speed, and stop with turns without change in gait speed or imbalance  Stairs Stairs: Yes Stairs assistance: Modified independent (Device/Increase time) Stair Management: One rail Right, Alternating pattern, Forwards Number of Stairs: 11 General stair comments: supervision initially for safety but pt performing modI without difficulty  Wheelchair Mobility    Modified Rankin (Stroke Patients Only) Modified Rankin (Stroke Patients Only) Pre-Morbid Rankin Score: No symptoms Modified Rankin: No significant disability     Balance Overall balance assessment: No apparent balance deficits (not formally assessed)                                           Pertinent Vitals/Pain Pain Assessment Pain Assessment: Faces Faces Pain Scale: Hurts little more Pain Location: head Pain Descriptors / Indicators: Aching Pain Intervention(s): Monitored during session    Home Living Family/patient expects to be discharged to:: Private residence Living Arrangements: Alone Available Help at Discharge: Family;Available PRN/intermittently Type of  Home: Apartment Home Access: Stairs to enter Entrance Stairs-Rails: Right;Left;Can reach both Entrance Stairs-Number of Steps: 32   Home Layout: One level Home Equipment: None      Prior Function Prior Level of Function :  Independent/Modified Independent;Driving;Working/employed             Mobility Comments: independent at baseline, no falls, drives, teaches at [employer]       Hand Dominance   Dominant Hand: Right    Extremity/Trunk Assessment   Upper Extremity Assessment Upper Extremity Assessment: Defer to OT evaluation    Lower Extremity Assessment Lower Extremity Assessment: Overall WFL for tasks assessed    Cervical / Trunk Assessment Cervical / Trunk Assessment: Normal  Communication   Communication: No difficulties  Cognition Arousal/Alertness: Awake/alert Behavior During Therapy: WFL for tasks assessed/performed Overall Cognitive Status: Within Functional Limits for tasks assessed                                 General Comments: A&Ox4, pleasant throughout session        General Comments General comments (skin integrity, edema, etc.): VSS on room air    Exercises     Assessment/Plan    PT Assessment Patient does not need any further PT services  PT Problem List         PT Treatment Interventions      PT Goals (Current goals can be found in the Care Plan section)  Acute Rehab PT Goals Patient Stated Goal: go home PT Goal Formulation: All assessment and education complete, DC therapy    Frequency       Co-evaluation               AM-PAC PT "6 Clicks" Mobility  Outcome Measure Help needed turning from your back to your side while in a flat bed without using bedrails?: None Help needed moving from lying on your back to sitting on the side of a flat bed without using bedrails?: None Help needed moving to and from a bed to a chair (including a wheelchair)?: None Help needed standing up from a chair using your arms (e.g., wheelchair or bedside chair)?: None Help needed to walk in hospital room?: None Help needed climbing 3-5 steps with a railing? : None 6 Click Score: 24    End of Session Equipment Utilized During Treatment: Gait belt Activity  Tolerance: Patient tolerated treatment well Patient left: in bed;with call bell/phone within reach Nurse Communication: Mobility status PT Visit Diagnosis: Other abnormalities of gait and mobility (R26.89)    Time: 0940-1001 PT Time Calculation (min) (ACUTE ONLY): 21 min   Charges:   PT Evaluation $PT Eval Low Complexity: 1 Low          Lindalou Hose, PT DPT Acute Rehabilitation Services Office [phone]   Leonie Man 09/30/2022, 1:42 PM

## 2022-09-30 NOTE — Evaluation (Signed)
Speech Language Pathology Evaluation Patient Details Name: Heather Cook MRN: [id number] DOB: 04-01-1961 Today's Date: 09/30/2022 Time: 0810-0840 SLP Time Calculation (min) (ACUTE ONLY): 30 min  Problem List:  Patient Active Problem List   Diagnosis Date Noted   TIA (transient ischemic attack) 09/29/2022   Acute CVA (cerebrovascular accident) (HCC) 09/29/2022   Controlled type 2 diabetes mellitus without complication, without long-term current use of insulin (HCC) 09/29/2022   GERD (gastroesophageal reflux disease) 09/29/2022   Past Medical History:  Past Medical History:  Diagnosis Date   Diabetes mellitus without complication (HCC)    GERD (gastroesophageal reflux disease)    H. pylori infection    History of stomach ulcers    Sciatica    Past Surgical History:  Past Surgical History:  Procedure Laterality Date   TUBAL LIGATION     HPI:  62 yo female adm to [facility] with right arm weakness.  Pt found to have possible small basal ganglia CVA. Pt PMH + for DM, HTN.  Speech evaluation ordered.   Assessment / Plan / Recommendation Clinical Impression  [facility] Mental Status exam administered with pt scoring 27/30 points.  Her speech and language are fluent and no indication of dysarthria.  Only difficulty on exam was pt's recall of 3/5 words but she was able to identify correct words with cues.  Pt was also distracted by pain and eating during evaluation.  Pt able to carry on high level conversation re: her health, controlling DM and symptoms of CVA.  Reviewed testing and educated pt to CVA symptoms - no SLP follow up needed.  Thanks for this consult.    SLP Assessment  SLP Recommendation/Assessment: Patient does not need any further Speech Lanaguage Pathology Services SLP Visit Diagnosis: Cognitive communication deficit (R41.841) Attention and concentration deficit following: Cerebral infarction    Recommendations for follow up therapy are one component of a  multi-disciplinary discharge planning process, led by the attending physician.  Recommendations may be updated based on patient status, additional functional criteria and insurance authorization.    Follow Up Recommendations  No SLP follow up    Assistance Recommended at Discharge  None  Functional Status Assessment Patient has not had a recent decline in their functional status  Frequency and Duration      N/a     SLP Evaluation Cognition  Overall Cognitive Status: Within Functional Limits for tasks assessed Arousal/Alertness: Awake/alert Orientation Level: Oriented X4 Year: 2024 Month: May Day of Week: Correct Attention: Sustained;Selective Sustained Attention: Appears intact Selective Attention: Appears intact Memory: Impaired Memory Impairment: Retrieval deficit Awareness: Appears intact Problem Solving: Appears intact Safety/Judgment: Appears intact       Comprehension  Auditory Comprehension Overall Auditory Comprehension: Appears within functional limits for tasks assessed Yes/No Questions: Within Functional Limits Commands: Within Functional Limits Conversation: Complex Interfering Components: Other (comment) (pain and pt eating breakfast during evaluation) Visual Recognition/Discrimination Discrimination: Not tested Reading Comprehension Reading Status: Not tested    Expression Expression Primary Mode of Expression: Verbal Verbal Expression Overall Verbal Expression: Appears within functional limits for tasks assessed Initiation: No impairment Repetition:  (dnt) Naming: Not tested Written Expression Dominant Hand: Right Written Expression: Not tested   Oral / Motor  Oral Motor/Sensory Function Overall Oral Motor/Sensory Function: Within functional limits Motor Speech Overall Motor Speech: Appears within functional limits for tasks assessed Respiration: Within functional limits Resonance: Within functional limits Articulation: Within functional  limitis Intelligibility: Intelligible Motor Planning: Witnin functional limits  Chales Abrahams 09/30/2022, 9:30 AM Rolena Infante, MS [facility] SLP Acute Rehab Services Office [phone]

## 2022-10-07 ENCOUNTER — Ambulatory Visit (INDEPENDENT_AMBULATORY_CARE_PROVIDER_SITE_OTHER): Payer: Medicaid Other | Admitting: Physician Assistant

## 2022-10-07 ENCOUNTER — Ambulatory Visit: Payer: Self-pay

## 2022-10-07 ENCOUNTER — Encounter: Payer: Self-pay | Admitting: Physician Assistant

## 2022-10-07 VITALS — BP 129/69 | HR 86 | Ht 63.0 in | Wt 189.0 lb

## 2022-10-07 DIAGNOSIS — R2990 Unspecified symptoms and signs involving the nervous system: Secondary | ICD-10-CM | POA: Diagnosis not present

## 2022-10-07 NOTE — Patient Instructions (Signed)
Follow up in 6 months Continue ASA and Plavix Continue good cardiovascular care    Limit use of pain relievers to no more than 2 days out of the week.  These medications include acetaminophen, NSAIDs (ibuprofen/Advil/Motrin, naproxen/Aleve, triptans (Imitrex/sumatriptan), Excedrin, and narcotics.  This will help reduce risk of rebound headaches. Be aware of common food triggers:  - Caffeine:  coffee, black tea, cola, Mt. Dew  - Chocolate  - Dairy:  aged cheeses (brie, blue, cheddar, gouda, [employer], provolone, [place], Swiss, etc), chocolate milk, buttermilk, sour cream, limit eggs and yogurt  - Nuts, peanut butter  - Alcohol  - Cereals/grains:  FRESH breads (fresh bagels, sourdough, doughnuts), yeast productions  - Processed/canned/aged/cured meats (pre-packaged deli meats, hotdogs)  - MSG/glutamate:  soy sauce, flavor enhancer, pickled/preserved/marinated foods  - Sweeteners:  aspartame (Equal, Nutrasweet).  Sugar and Splenda are okay  - Vegetables:  legumes (lima beans, lentils, snow peas, fava beans, pinto peans, peas, garbanzo beans), sauerkraut, onions, olives, pickles  - Fruit:  avocados, bananas, citrus fruit (orange, lemon, grapefruit), mango  - Other:  Frozen meals, macaroni and cheese Routine exercise Stay adequately hydrated (aim for 64 oz water daily) Keep headache diary Maintain proper stress management Maintain proper sleep hygiene Do not skip meals Consider supplements:  magnesium citrate 400mg  daily, riboflavin 400mg  daily, coenzyme Q10 100mg  three times daily.

## 2022-10-07 NOTE — Progress Notes (Cosign Needed)
[facility] Clinic Note - Initial Visit   Date: 10/07/22  Heather Cook MRN: [id number] DOB: 10/23/1960   Dear Dr  Patient, No Pcp Per:  Thank you for your kind referral of Heather Cook for consultation of recent presentation to the ED with stroke like symptoms. Although her history is well known to you, please allow Heather Cook to reiterate it for the purpose of our medical record. The patient was accompanied to the clinic by her daughters  who also provide  collateral information.     IMPRESSION/PLAN: Stroke like symptoms, concern for TIA versus complex migraine.  MRI brain, CTA head and neck unremarkable. 2 D echo without acute findings.LDL 136, A1C 8.9. No residual symptoms Recommend good control of cardiovascular risk factors.   Recommend secondary stroke prevention with high dose statin, ASA and Plavix for 3 weeks and then baby ASA alone, BP meds  Recommend good control of her A1C Increase physical activity Stress reduction is recommended     History of Present Illness:  Heather Cook is a  delightful 62 y.o. R-handed female with history of DM2, GERD, hypertension, hyperlipidemia, presenting for evaluation of recent stroke like symptoms.  In review, the patient experienced acute episode of right arm weakness and numbness on 09/29/2022 while driving. Symptoms lasted about 8 hours, then resolved.  For about 2 months, the patient was having intermittent parietal headaches, quickly resolving, and at the time of presentation to the ER, she experienced a brief episode of this type of headaches (which she had contributed to stress).  CT of the head was concerning for acute  infarct in the left basal ganglia but the MRI of the brain was negative for acute CVA. CT angio of the head and neck was negative for stenosis or LVO. Other workup including echo was unremarkable.  Symptomatology, was suspicious for left brain TIA versus complicated migraine after evaluation by neurology at  the hospital.  She was placed on aspirin and Plavix for 3 weeks, and then recommended to be on aspirin alone, high intensity statin's, and good control of the blood pressure. Patient  never had a similar episode. Denies any history of TIA . In the past she may had vertigo, but denied dizziness or vision changes during the hospitalization. Denies dysarthria or dysphagia. No confusion or seizures. Denies any chest pain, or shortness of breath. Denies any fever or chills, had sweat prior to presentation.  No tobacco. No new meds or hormonal supplements. She is now on daily ASA a day and Plavix for 3 weeks, currently on d6. Denies any recent long distance trips or recent surgeries. No sick contacts. 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 She was not on meds. No family history of stroke, but Mother had MI.  No tPA was given, she was out of the window. She is diabetic and her A1C was elevated at 8.9. .      Lab Results  Component Value Date   HGBA1C 8.9 (H) 09/29/2022      Past Medical History:  Diagnosis Date   Diabetes mellitus without complication (HCC)    GERD (gastroesophageal reflux disease)    H. pylori infection    History of stomach ulcers    Sciatica     Past Surgical History:  Procedure Laterality Date   TUBAL LIGATION       Medications:  Outpatient Encounter Medications as of 10/07/2022  Medication Sig   aspirin EC 81 MG tablet Take 1  tablet (81 mg total) by mouth daily. Swallow whole.   atorvastatin (LIPITOR) 40 MG tablet Take 1 tablet (40 mg total) by mouth daily.   clopidogrel (PLAVIX) 75 MG tablet Take 1 tablet (75 mg total) by mouth daily. Take for 21 days along with aspirin-after 21 days stop Plavix and continue aspirin.   empagliflozin (JARDIANCE) 10 MG TABS tablet Take 1 tablet (10 mg total) by mouth daily.   pantoprazole (PROTONIX) 40 MG tablet Take 1 tablet (40 mg total) by mouth daily.   No facility-administered encounter medications on  file as of 10/07/2022.    Allergies:  Allergies  Allergen Reactions   Penicillins Swelling    Has patient had a PCN reaction causing immediate rash, facial/tongue/throat swelling, SOB or lightheadedness with hypotension: Yes Has patient had a PCN reaction causing severe rash involving mucus membranes or skin necrosis: No Has patient had a PCN reaction that required hospitalization: No Has patient had a PCN reaction occurring within the last 10 years: No If all of the above answers are "NO", then may proceed with Cephalosporin use.    Childhood allergy.   Almond Oil Swelling   Peanuts [Peanut Oil] Swelling    Facial swelling   Morphine And Related Nausea And Vomiting   Strawberry Extract Other (See Comments)    Dizziness.    Tramadol Nausea Only    Family History: Family History  Adopted: Yes  Family history unknown: Yes    Social History: Social History   Tobacco Use   Smoking status: Never   Smokeless tobacco: Never  Substance Use Topics   Alcohol use: No   Drug use: No   Social History   Social History Narrative   Not on file    Vital Signs:  LMP 08/12/2014 (Exact Date)    General Medical Exam:    General:  Well appearing, comfortable.   Eyes/ENT: see cranial nerve examination.   Respiratory:  No respiratory distress .   Extremities:  No deformities, edema, or skin discoloration.  Skin:  No rashes or lesions.  Neurological Exam: MENTAL STATUS including orientation to time, place, person, recent and remote memory, attention span and concentration, language, and fund of knowledge is normal.  Speech is not dysarthric.  CRANIAL NERVES: II:  No visual field defects.  Fundi not visualized III-IV-VI: Pupils equal round and reactive to light.  Normal conjugate, extra-ocular eye movements in all directions of gaze.  No nystagmus.  No ptosis .   V:  Normal facial sensation.    VII:  Normal facial symmetry and movements.   VIII:  Normal hearing and vestibular  function.   IX-X:  Normal palatal movement.   XI:  Normal shoulder shrug and head rotation.   XII:  Normal tongue strength and range of motion, no deviation or fasciculation.  MOTOR:  No atrophy, fasciculations or abnormal movements.  No pronator drift.    SENSORY:  Normal and symmetric perception of light touch, pinprick, vibration, and proprioception.     COORDINATION/GAIT: Normal finger-to- nose-finger and heel-to-shin.  Intact rapid alternating movements bilaterally.  Able to rise from a chair without using arms.  Gait narrow based and stable. Tandem and stressed gait intact.     Total time spent:  60   Thank you for allowing me to participate in patient's care.  If I can answer any additional questions, I would be pleased to do so.    Sincerely,   Marlowe Kays, PA-C

## 2022-10-13 ENCOUNTER — Encounter (HOSPITAL_COMMUNITY): Payer: Self-pay

## 2022-10-13 ENCOUNTER — Encounter: Payer: Self-pay | Admitting: Physician Assistant

## 2022-10-13 ENCOUNTER — Emergency Department (HOSPITAL_COMMUNITY)
Admission: EM | Admit: 2022-10-13 | Discharge: 2022-10-13 | Disposition: A | Discharge status: Home / Self Care | Payer: Medicaid Other | Attending: Emergency Medicine | Admitting: Emergency Medicine

## 2022-10-13 ENCOUNTER — Other Ambulatory Visit: Payer: Self-pay

## 2022-10-13 ENCOUNTER — Ambulatory Visit: Payer: Medicaid Other | Attending: Physician Assistant | Admitting: Physician Assistant

## 2022-10-13 ENCOUNTER — Emergency Department (HOSPITAL_COMMUNITY): Payer: Medicaid Other

## 2022-10-13 VITALS — BP 127/72 | HR 93 | Wt 188.8 lb

## 2022-10-13 DIAGNOSIS — G459 Transient cerebral ischemic attack, unspecified: Secondary | ICD-10-CM | POA: Diagnosis not present

## 2022-10-13 DIAGNOSIS — E119 Type 2 diabetes mellitus without complications: Secondary | ICD-10-CM | POA: Insufficient documentation

## 2022-10-13 DIAGNOSIS — E1165 Type 2 diabetes mellitus with hyperglycemia: Secondary | ICD-10-CM | POA: Diagnosis not present

## 2022-10-13 DIAGNOSIS — T7840XA Allergy, unspecified, initial encounter: Secondary | ICD-10-CM | POA: Diagnosis not present

## 2022-10-13 DIAGNOSIS — Z7982 Long term (current) use of aspirin: Secondary | ICD-10-CM | POA: Diagnosis not present

## 2022-10-13 DIAGNOSIS — Z91010 Allergy to peanuts: Secondary | ICD-10-CM | POA: Insufficient documentation

## 2022-10-13 DIAGNOSIS — Z7984 Long term (current) use of oral hypoglycemic drugs: Secondary | ICD-10-CM

## 2022-10-13 DIAGNOSIS — Z7902 Long term (current) use of antithrombotics/antiplatelets: Secondary | ICD-10-CM | POA: Diagnosis not present

## 2022-10-13 LAB — CBC WITH DIFFERENTIAL/PLATELET
Abs Immature Granulocytes: 0.02 10*3/uL (ref 0.00–0.07)
Basophils Absolute: 0 10*3/uL (ref 0.0–0.1)
Basophils Relative: 1 %
Eosinophils Absolute: 0.2 10*3/uL (ref 0.0–0.5)
Eosinophils Relative: 2 %
HCT: 43.9 % (ref 36.0–46.0)
Hemoglobin: 13.6 g/dL (ref 12.0–15.0)
Immature Granulocytes: 0 %
Lymphocytes Relative: 27 %
Lymphs Abs: 2.1 10*3/uL (ref 0.7–4.0)
MCH: 26.2 pg (ref 26.0–34.0)
MCHC: 31 g/dL (ref 30.0–36.0)
MCV: 84.4 fL (ref 80.0–100.0)
Monocytes Absolute: 0.5 10*3/uL (ref 0.1–1.0)
Monocytes Relative: 7 %
Neutro Abs: 5 10*3/uL (ref 1.7–7.7)
Neutrophils Relative %: 63 %
Platelets: 267 10*3/uL (ref 150–400)
RBC: 5.2 MIL/uL — ABNORMAL HIGH (ref 3.87–5.11)
RDW: 13.3 % (ref 11.5–15.5)
WBC: 7.9 10*3/uL (ref 4.0–10.5)
nRBC: 0 % (ref 0.0–0.2)

## 2022-10-13 LAB — BASIC METABOLIC PANEL
Anion gap: 11 (ref 5–15)
BUN: 10 mg/dL (ref 8–23)
CO2: 25 mmol/L (ref 22–32)
Calcium: 9.6 mg/dL (ref 8.9–10.3)
Chloride: 102 mmol/L (ref 98–111)
Creatinine, Ser: 1.06 mg/dL — ABNORMAL HIGH (ref 0.44–1.00)
GFR, Estimated: 59 mL/min — ABNORMAL LOW (ref >60)
Glucose, Bld: 149 mg/dL — ABNORMAL HIGH (ref 70–99)
Potassium: 3.5 mmol/L (ref 3.5–5.1)
Sodium: 138 mmol/L (ref 135–145)

## 2022-10-13 LAB — GLUCOSE, POCT (MANUAL RESULT ENTRY): POC Glucose: 138 mg/dl — AB (ref 70–99)

## 2022-10-13 MED ORDER — EPINEPHRINE 0.3 MG/0.3ML IJ SOAJ
0.3000 mg | INTRAMUSCULAR | 1 refills | Status: DC | PRN
  Filled 2022-10-13: qty 1, fill #0
  Filled 2022-10-21: qty 2, 30d supply, fill #0

## 2022-10-13 MED ORDER — DIPHENHYDRAMINE HCL 25 MG PO CAPS
25.0000 mg | ORAL_CAPSULE | Freq: Once | ORAL | Status: AC
Start: 2022-10-13 — End: 2022-10-13
  Administered 2022-10-13: 25 mg via ORAL

## 2022-10-13 MED ORDER — METHYLPREDNISOLONE SODIUM SUCC 125 MG IJ SOLR
125.0000 mg | Freq: Once | INTRAMUSCULAR | Status: AC
  Administered 2022-10-13: 125 mg via INTRAVENOUS
  Filled 2022-10-13: qty 2

## 2022-10-13 MED ORDER — EPINEPHRINE 0.3 MG/0.3ML IJ SOAJ
0.3000 mg | INTRAMUSCULAR | 1 refills | Status: AC | PRN
Start: 2022-10-13 — End: ?
  Filled 2022-10-13: qty 2, fill #0

## 2022-10-13 MED ORDER — CETIRIZINE HCL 10 MG PO TABS
10.0000 mg | ORAL_TABLET | Freq: Every day | ORAL | 11 refills | Status: DC
Start: 2022-10-13 — End: 2023-01-06
  Filled 2022-10-13: qty 30, 30d supply, fill #0

## 2022-10-13 MED ORDER — FAMOTIDINE IN NACL 20-0.9 MG/50ML-% IV SOLN
20.0000 mg | Freq: Once | INTRAVENOUS | Status: AC
  Administered 2022-10-13: 20 mg via INTRAVENOUS
  Filled 2022-10-13: qty 50

## 2022-10-13 MED ORDER — SODIUM CHLORIDE 0.9 % IV BOLUS
1000.0000 mL | Freq: Once | INTRAVENOUS | Status: AC
  Administered 2022-10-13: 1000 mL via INTRAVENOUS

## 2022-10-13 MED ORDER — SODIUM CHLORIDE 0.9 % IV SOLN: INTRAVENOUS | Status: DC

## 2022-10-13 NOTE — ED Provider Notes (Signed)
[place] EMERGENCY DEPARTMENT AT [facility] Provider Note   CSN: [id number] Arrival date & time: 10/13/22  1545     History  Chief Complaint  Patient presents with   Allergic Reaction    Heather Cook is a 62 y.o. female.  With history of GERD, diabetes, CVA who presents to the ED for an allergic reaction.  She states she started 5 new medications 2 weeks ago.  These medications include aspirin, Plavix, Lipitor, Protonix and Jardiance.  She states she developed upper lip swelling yesterday.  This resolved when she woke up, however she also developed lower lip swelling today.  She also reports facial hives and itching.  She presented to her PCP again who reported seeing some throat swelling as well so they called the ambulance.  Patient does report some difficulty swallowing and feels like substances gets stuck in her throat.  She denies drooling, shortness of breath, wheezing, chest pain, nausea, vomiting, headaches, dizziness, lightheadedness.  She received 50 mg of p.o. Benadryl and 0.3 mg EpiPen prior to arrival.  She reports minimal change in her symptoms since that time.   Allergic Reaction      Home Medications Prior to Admission medications   Medication Sig Start Date End Date Taking? Authorizing Provider  EPINEPHrine 0.3 mg/0.3 mL IJ SOAJ injection Inject 0.3 mg into the muscle as needed for anaphylaxis. 10/13/22  Yes Joseandres Mazer, Edsel Petrin, PA-C  aspirin EC 81 MG tablet Take 1 tablet (81 mg total) by mouth daily. Swallow whole. 10/01/22   Ghimire, Werner Lean, MD  atorvastatin (LIPITOR) 40 MG tablet Take 1 tablet (40 mg total) by mouth daily. 09/30/22   Ghimire, Werner Lean, MD  cetirizine (ZYRTEC) 10 MG tablet Take 1 tablet (10 mg total) by mouth daily. 10/13/22   Anders Simmonds, PA-C  clopidogrel (PLAVIX) 75 MG tablet Take 1 tablet (75 mg total) by mouth daily. Take for 21 days along with aspirin-after 21 days stop Plavix and continue aspirin. 09/30/22   Ghimire, Werner Lean, MD   empagliflozin (JARDIANCE) 10 MG TABS tablet Take 1 tablet (10 mg total) by mouth daily. 09/30/22   Ghimire, Werner Lean, MD  EPINEPHrine 0.3 mg/0.3 mL IJ SOAJ injection Inject 0.3 mg into the muscle as needed for anaphylaxis. 10/13/22   Anders Simmonds, PA-C  pantoprazole (PROTONIX) 40 MG tablet Take 1 tablet (40 mg total) by mouth daily. 10/01/22   Ghimire, Werner Lean, MD      Allergies    Penicillins, Almond oil, Peanuts [peanut oil], Morphine and codeine, Strawberry extract, and Tramadol    Review of Systems   Review of Systems  Allergic/Immunologic:       Medication allergy  All other systems reviewed and are negative.   Physical Exam Updated Vital Signs BP (!) 157/74   Pulse 94   Temp 97.9 F (36.6 C) (Oral)   Resp 14   Ht 5\' 5"  (1.651 m)   Wt 85.3 kg   LMP 08/12/2014 (Exact Date)   SpO2 97%   BMI 31.28 kg/m  Physical Exam Vitals and nursing note reviewed.  Constitutional:      General: She is not in acute distress.    Appearance: She is well-developed.     Comments: Resting comfortably in bed  HENT:     Head: Normocephalic and atraumatic.     Mouth/Throat:     Comments: Mild lower lip swelling.  No glossitis, tonsil swelling, uvula swelling, drooling, trismus, tripoding Eyes:  Conjunctiva/sclera: Conjunctivae normal.  Cardiovascular:     Rate and Rhythm: Normal rate and regular rhythm.     Heart sounds: No murmur heard. Pulmonary:     Effort: Pulmonary effort is normal. No respiratory distress.     Breath sounds: Normal breath sounds. No wheezing, rhonchi or rales.  Abdominal:     Palpations: Abdomen is soft.     Tenderness: There is no abdominal tenderness. There is no guarding.  Musculoskeletal:        General: No swelling.     Cervical back: Neck supple.  Skin:    General: Skin is warm and dry.     Capillary Refill: Capillary refill takes less than 2 seconds.  Neurological:     General: No focal deficit present.     Mental Status: She is alert and  oriented to person, place, and time.  Psychiatric:        Mood and Affect: Mood normal.     ED Results / Procedures / Treatments   Labs (all labs ordered are listed, but only abnormal results are displayed) Labs Reviewed  BASIC METABOLIC PANEL - Abnormal; Notable for the following components:      Result Value   Glucose, Bld 149 (*)    Creatinine, Ser 1.06 (*)    GFR, Estimated 59 (*)    All other components within normal limits  CBC WITH DIFFERENTIAL/PLATELET - Abnormal; Notable for the following components:   RBC 5.20 (*)    All other components within normal limits    EKG None  Radiology DG Chest Port 1 View  Result Date: 10/13/2022 CLINICAL DATA:  Allergic reaction. Face and lip swelling. On 5 new medicines. EXAM: PORTABLE CHEST 1 VIEW COMPARISON:  Chest radiographs 08/08/2018 FINDINGS: Cardiac silhouette and mediastinal contours are within normal limits. The lungs are clear. No pleural effusion or pneumothorax. Mild levocurvature of the upper thoracic spine, unchanged. Moderate multilevel degenerative disc space narrowing and bridging osteophytes. IMPRESSION: 1. No acute cardiopulmonary process. 2. Moderate multilevel degenerative disc disease. Electronically Signed   By: Neita Garnet M.D.   On: 10/13/2022 17:03    Procedures Procedures    Medications Ordered in ED Medications  sodium chloride 0.9 % bolus 1,000 mL (0 mLs Intravenous Stopped 10/13/22 2251)    And  0.9 %  sodium chloride infusion (0 mLs Intravenous Stopped 10/13/22 2251)  methylPREDNISolone sodium succinate (SOLU-MEDROL) 125 mg/2 mL injection 125 mg (125 mg Intravenous Given 10/13/22 1845)  famotidine (PEPCID) IVPB 20 mg premix (0 mg Intravenous Stopped 10/13/22 2028)    ED Course/ Medical Decision Making/ A&P                             Medical Decision Making Amount and/or Complexity of Data Reviewed Labs: ordered. Radiology: ordered.  Risk Prescription drug management.  This patient presents  to the ED for concern of allergic reaction, this involves an extensive number of treatment options, and is a complaint that carries with it a high risk of complications and morbidity.  Differential diagnosis includes anaphylaxis, angioedema, medication allergy  Co morbidities that complicate the patient evaluation  GERD, diabetes, CVA  My initial workup includes allergic reaction workup  Additional history obtained from: Nursing notes from this visit. Previous records within EMR system PCP visit from today EMS provides a portion of the history  I ordered, reviewed and interpreted labs which include: BMP, CBC.  Labs reassuring  I ordered imaging studies  including chest x-ray I independently visualized and interpreted imaging which showed normal I agree with the radiologist interpretation  Cardiac Monitoring:  The patient was maintained on a cardiac monitor.  I personally viewed and interpreted the cardiac monitored which showed an underlying rhythm of: NSR  Afebrile, hemodynamically stable.  62 year old female presenting to the ED for evaluation of possible allergic reaction.  She started 5 new medications at once approximately 2 weeks ago.  She developed upper lip swelling yesterday and lower lip swelling today.  She has not had any shortness of breath or difficulty breathing but she has had some difficulty swallowing.  She was given an EpiPen, Benadryl, Pepcid and Solu-Medrol for her symptoms.  She was monitored for 7 hours in the emergency department without worsening of her symptoms.  She has some mild swelling to the lower lip but no other intraoral swelling.  She is tolerating p.o. in the ED.  Believe she is safe to be discharged home at this time.  Overall suspect her angioedema is secondary to a medication reaction.  Unsure which medication as she started 5 at the same time.  She was encouraged to take her Plavix and to withhold her other medications until she is able to follow-up with  her PCP.  She was sent a prescription for an EpiPen to the [facility] pharmacy as she reports being without insurance at this time and is concerned about how she will pay for it.  She was also encouraged to take Benadryl and Pepcid for her symptoms.  She was given contact information for the [facility] and encouraged to follow-up.  She was given strict return precautions including signs and symptoms of worsening angioedema or anaphylaxis.  Stable at discharge.  At this time there does not appear to be any evidence of an acute emergency medical condition and the patient appears stable for discharge with appropriate outpatient follow up. Diagnosis was discussed with patient who verbalizes understanding of care plan and is agreeable to discharge. I have discussed return precautions with patient who verbalizes understanding. Patient encouraged to follow-up with their PCP as soon as possible. All questions answered.  Patient's case discussed with Dr. Dalene Seltzer who agrees with plan to discharge with follow-up.   Note: Portions of this report may have been transcribed using voice recognition software. Every effort was made to ensure accuracy; however, inadvertent computerized transcription errors may still be present.        Final Clinical Impression(s) / ED Diagnoses Final diagnoses:  Allergic reaction, initial encounter    Rx / DC Orders ED Discharge Orders          Ordered    EPINEPHrine 0.3 mg/0.3 mL IJ SOAJ injection  As needed        10/13/22 2234              Mora Bellman 10/13/22 2256    Alvira Monday, MD 10/14/22 1356

## 2022-10-13 NOTE — ED Notes (Signed)
Pt requested to speak pt Consulting civil engineer, RN bedside.  Pt and daughter upset b/c staff "has not been in the room... and when they are they are hurrying."  Pt states "you are starving me, I have been here since 7pm and I'm hungry."  "I have not spoken to a Doctor yet."  RN attempted to de-esclate however pt is very angry.  RN spoke to provider who indicated they will go in and speak to the pt again.  Provider aware pt is hungry.  Pt notified.

## 2022-10-13 NOTE — ED Triage Notes (Signed)
Pt arrived via ems for possible allergic reaction pt recently started five new medications Pt reports face and lip swelling since yesterday pt received 50 mg benadryl PO at 1507 and 0.3 mg epi per ems pt is managing secretion airway intact NAD

## 2022-10-13 NOTE — ED Notes (Signed)
Pt's lips are less edematous. Pt denies any trouble swallowing or speaking.

## 2022-10-13 NOTE — ED Notes (Signed)
Attempted to start an IV in both Acs, but was unsuccessful. The pt states she usually has to have the [name] in order to have an IV placed. Provider made aware.

## 2022-10-13 NOTE — Discharge Instructions (Addendum)
You have been seen today for your complaint of lip swelling. Your discharge medications include EpiPen.  A prescription was sent to the [facility] pharmacy near [place].  I have also sent a prescription to the [facility] care [facility].  Take this immediately if you feel difficulty breathing or feeling like your throat is closing up. You should also continue to take Pepcid and Benadryl for your allergic reaction symptoms Home care instructions are as follows:  Only take your Plavix until you are able to follow-up with your primary care provider to determine which medication caused a reaction Follow up with: Your primary care provider soon as possible.  I have also included the contact information for [place] allergy specialists.  You may call to schedule an appointment regarding your allergic reaction Please seek immediate medical care if you develop any of the following symptoms: You use the auto-injector pen. You must still get medical help, even if the medicine seems to be working. You have side effects from the epinephrine. These may include: Fast or irregular heartbeat. Nervousness or anxiety with shaking that does not stop. Trouble breathing. Sweating. Headache. Nausea or vomiting. Dizziness or weakness. At this time there does not appear to be the presence of an emergent medical condition, however there is always the potential for conditions to change. Please read and follow the below instructions.  Do not take your medicine if  develop an itchy rash, swelling in your mouth or lips, or difficulty breathing; call 911 and seek immediate emergency medical attention if this occurs.  You may review your lab tests and imaging results in their entirety on your MyChart account.  Please discuss all results of fully with your primary care provider and other specialist at your follow-up visit.  Note: Portions of this text may have been transcribed using voice recognition software. Every  effort was made to ensure accuracy; however, inadvertent computerized transcription errors may still be present.

## 2022-10-13 NOTE — Progress Notes (Signed)
Patient ID: Heather Cook, female   DOB: 06/27/1960, 62 y.o.   MRN: [id number]     Heather Cook, is a 62 y.o. female  [id number]  [id number]  DOB - [birth year]  Chief Complaint  Patient presents with   Hospitalization Follow-up       Subjective:   Heather Cook is a 62 y.o. female here today for a follow up visit and to establish care After hospitalization overnight 5/2-5/3 with TIA.  She was prescribed atorvastatin, plavix, aspirin, pantoprazole, and jardiance.  Yesterday her upper lip was swollen.  Today her lower lip was swollen.  Then she started getting a few hives and having itching.  As I was seeing her, the reaction started progressing.  She has a h/o anaphylaxis with PCN, nuts.  Her throat is starting to have a catch in it.  I gave her 50mg  po benadryl and we call 911.  She is able to swallow  From discharge summary: Brief Hospital Course: TIA CT head suggestive of left basal ganglia infarct-however no infarct seen on MRI brain Right arm weakness has resolved Symptomatology probably due to TIA Continue ASA/Plavix x 3 weeks then aspirin alone, continue high intensity statin on discharge.   Patient aware that she needs to modify her risk factors to minimize risk of CVA in the future.   DM-2 Very hesitant to start new medications-apparently intolerant to metformin in the past-if she is agreeable-will start jardiance   GERD/PUD PPI    transition of care team following for medication assistance/outpatient PCP arrangement.   Obesity: Estimated body mass index is 34.37 kg/m as calculated from the following:   Height as of 03/02/21: 5\' 3"  (1.6 m).   Weight as of this encounter: 88 kg.   Discharge Diagnoses:  Principal Problem:   Acute CVA (cerebrovascular accident) [facility]) Active Problems:   Controlled type 2 diabetes mellitus without complication, without long-term current use of insulin (HCC)   GERD (gastroesophageal reflux disease) No problems  updated.  ALLERGIES: Allergies  Allergen Reactions   Penicillins Swelling    Has patient had a PCN reaction causing immediate rash, facial/tongue/throat swelling, SOB or lightheadedness with hypotension: Yes Has patient had a PCN reaction causing severe rash involving mucus membranes or skin necrosis: No Has patient had a PCN reaction that required hospitalization: No Has patient had a PCN reaction occurring within the last 10 years: No If all of the above answers are "NO", then may proceed with Cephalosporin use.    Childhood allergy.   Almond Oil Swelling   Peanuts [Peanut Oil] Swelling    Facial swelling   Morphine And Codeine Nausea And Vomiting   Strawberry Extract Other (See Comments)    Dizziness.    Tramadol Nausea Only    PAST MEDICAL HISTORY: Past Medical History:  Diagnosis Date   Diabetes mellitus without complication (HCC)    GERD (gastroesophageal reflux disease)    H. pylori infection    History of stomach ulcers    Sciatica     MEDICATIONS AT HOME: Prior to Admission medications   Medication Sig Start Date End Date Taking? Authorizing Provider  aspirin EC 81 MG tablet Take 1 tablet (81 mg total) by mouth daily. Swallow whole. 10/01/22  Yes Ghimire, Werner Lean, MD  atorvastatin (LIPITOR) 40 MG tablet Take 1 tablet (40 mg total) by mouth daily. 09/30/22  Yes Ghimire, Werner Lean, MD  cetirizine (ZYRTEC) 10 MG tablet Take 1 tablet (10 mg total) by mouth daily. 10/13/22  Yes Anders Simmonds,  PA-C  clopidogrel (PLAVIX) 75 MG tablet Take 1 tablet (75 mg total) by mouth daily. Take for 21 days along with aspirin-after 21 days stop Plavix and continue aspirin. 09/30/22  Yes Ghimire, Werner Lean, MD  empagliflozin (JARDIANCE) 10 MG TABS tablet Take 1 tablet (10 mg total) by mouth daily. 09/30/22  Yes Ghimire, Werner Lean, MD  pantoprazole (PROTONIX) 40 MG tablet Take 1 tablet (40 mg total) by mouth daily. 10/01/22  Yes Ghimire, Werner Lean, MD    ROS: Neg HEENT Neg resp Neg  cardiac Neg GI Neg GU Neg MS Neg psych Neg neuro  Objective:   Vitals:   10/13/22 1435  BP: 127/72  Pulse: 93  SpO2: 97%  Weight: 188 lb 12.8 oz (85.6 kg)   Exam General appearance : Awake, alert, not in any distress. Speech Clear. Not toxic looking HEENT: Atraumatic and Normocephalic, lower lip swollen then posterior pharynx initially WNL then she started having upper posterior palate swelling Neck: Supple, no JVD. No cervical lymphadenopathy.  Chest: Good air entry bilaterally, CTAB.  No rales/rhonchi/wheezing.  No distress CVS: S1 S2 regular, no murmurs.  Extremities: B/L Lower Ext shows no edema, both legs are warm to touch Neurology: Awake alert, and oriented X 3, CN II-XII intact, Non focal Skin: scattered hives  Data Review Lab Results  Component Value Date   HGBA1C 8.9 (H) 09/29/2022    Assessment & Plan   1. Type 2 diabetes mellitus with hyperglycemia, unspecified whether long term insulin use (HCC) uncontrolled - Glucose (CBG)  2. Allergic reaction, initial encounter -epi pen ordered but patient sent out by EMS as reaction was progressing in office - cetirizine (ZYRTEC) 10 MG tablet; Take 1 tablet (10 mg total) by mouth daily.  Dispense: 30 tablet; Refill: 11 - diphenhydrAMINE (BENADRYL) capsule 25 mg  3. TIA (transient ischemic attack) still needs to be on meds.  My most likely guess would be the aspirin causing the swelling.  Will defer to hospital for now.      Return in about 2 weeks (around 10/27/2022) for with me or assign PCP.  The patient was given clear instructions to go to ER or return to medical center if symptoms don't improve, worsen or new problems develop. The patient verbalized understanding. The patient was told to call to get lab results if they haven't heard anything in the next week.      Georgian Co, PA-C [facility] [place], [place] [phone]   10/13/2022, 4:08 PM

## 2022-10-14 ENCOUNTER — Other Ambulatory Visit: Payer: Self-pay

## 2022-10-14 ENCOUNTER — Other Ambulatory Visit (HOSPITAL_COMMUNITY): Payer: Self-pay

## 2022-10-14 ENCOUNTER — Ambulatory Visit: Payer: Self-pay

## 2022-10-14 NOTE — Telephone Encounter (Signed)
Spoke with patient . Verified name & DOB  Advise patient there were not open appointment for today to discuss medications. Patient given appointment for Monday.

## 2022-10-14 NOTE — Telephone Encounter (Signed)
Call placed to patient unable to reach message left to advise patient that there are no available appointment for today. But we may could work her in on next week if her schedule allows.

## 2022-10-14 NOTE — Telephone Encounter (Signed)
  Chief Complaint: Pt need an appointment Symptoms:  Frequency:  Pertinent Negatives: Patient denies  Disposition: [] ED /[] Urgent Care (no appt availability in office) / [] Appointment(In office/virtual)/ []  [facility]/ [] Home Care/ [] Refused Recommended Disposition /[] [place] [facility]/ [x]  Follow-up with PCP Additional Notes: Pt was sent to ED yesterday at OV. Pt needs an OV. Per note daughter wants pt to be seen today.  Also pt needs to know what medications she should be taking. Please advise.    Summary: allergic reaction   Patients daughter is requesting that her mom be seen today as she was transported to the ER from her visit yesterday so she never got to see the provider. She was advised to call today to make an appt to be see today as the medication she was given at the ER gave her an allergic reaction and now she does not know what she need to take. Please contact patient at [phone]     Answer Assessment - Initial Assessment Questions 1. REASON FOR CALL or QUESTION: "What is your reason for calling today?" or "How can I best help you?" or "What question do you have that I can help answer?"     Pt needs an appt 2. CALLER: Document the source of call. (e.g., laboratory, patient).     Daughter of pt.  Protocols used: PCP Call - No Triage-A-AH

## 2022-10-14 NOTE — Telephone Encounter (Signed)
Pt called back, was asking about what she is suppose to do about medications since she doesn't know which caused allergic reaction or not. Pt was told yesterday by Marylene Land, PA to only take Plavix. I recommended pt go along with provider advice and if starting to have allergic reaction sx again to stop until can be seen in OV. Scheduled for 10/20/22 at 1410 with Dr. Laural Benes for FU. Pt states she also has no money or insurance so was unsure if she needed to get Epi-Pen or not. Advised her to call [facility] and they can advise on price of medication or assistance with getting medication. Pt verbalized understanding and had someone else calling from office number.

## 2022-10-17 ENCOUNTER — Ambulatory Visit: Payer: Medicaid Other | Attending: Family Medicine | Admitting: Family Medicine

## 2022-10-17 ENCOUNTER — Other Ambulatory Visit: Payer: Self-pay

## 2022-10-17 ENCOUNTER — Encounter: Payer: Self-pay | Admitting: Family Medicine

## 2022-10-17 VITALS — BP 119/74 | HR 72 | Temp 98.2°F | Ht 63.0 in | Wt 191.4 lb

## 2022-10-17 DIAGNOSIS — E1165 Type 2 diabetes mellitus with hyperglycemia: Secondary | ICD-10-CM | POA: Diagnosis not present

## 2022-10-17 DIAGNOSIS — Z7984 Long term (current) use of oral hypoglycemic drugs: Secondary | ICD-10-CM | POA: Diagnosis not present

## 2022-10-17 DIAGNOSIS — T7840XA Allergy, unspecified, initial encounter: Secondary | ICD-10-CM

## 2022-10-17 DIAGNOSIS — G459 Transient cerebral ischemic attack, unspecified: Secondary | ICD-10-CM | POA: Diagnosis not present

## 2022-10-17 NOTE — Progress Notes (Signed)
Subjective:  Patient ID: Heather Cook, female    DOB: 09-Dec-1960  Age: 62 y.o. MRN: [id number]  CC: Hospitalization Follow-up   HPI Heather Cook is a 62 y.o. year old female with a history of GERD, TIA, type 2 diabetes mellitus (A1c 8.9) here for follow-up from the ED. 3 weeks ago she was diagnosed with a TIA at which time CT scan had revealed basal ganglia infarct which was absent on MRI performed 24 hours later.  She was placed on Plavix, aspirin, Lipitor and Jardiance. She was seen by neurology for follow-up. Last week she presented to the clinic with an allergic reaction for which she was referred to the ED. Treated with Solu-Medrol, Pepcid, EpiPen, Benadryl.  Interval History:  Today she reports doing well and is not taking any of her medications.  Of note she had been taking all 5 medications together for 2 weeks prior to developing symptoms.  She has had allergic reactions to peanuts, strawberries and some other medications in the past in the past. She has been unable to obtain the EpiPen prescribed due to lack of medical coverage. At the moment she has no residual weakness, no facial swelling, no dyspnea, no edema. Past Medical History:  Diagnosis Date   Diabetes mellitus without complication (HCC)    GERD (gastroesophageal reflux disease)    H. pylori infection    History of stomach ulcers    Sciatica     Past Surgical History:  Procedure Laterality Date   TUBAL LIGATION      Family History  Adopted: Yes  Problem Relation Age of Onset   Dementia Mother    Heart attack Mother     Social History   Socioeconomic History   Marital status: Single    Spouse name: Not on file   Number of children: Not on file   Years of education: Not on file   Highest education level: Not on file  Occupational History   Not on file  Tobacco Use   Smoking status: Never   Smokeless tobacco: Never  Vaping Use   Vaping Use: Not on file  Substance and Sexual Activity   Alcohol use:  No   Drug use: No   Sexual activity: Never  Other Topics Concern   Not on file  Social History Narrative   Right Handed    Lives in apartment on the 3rd floor. One story floor plan   Social Determinants of Health   Financial Resource Strain: Not on file  Food Insecurity: Not on file  Transportation Needs: Not on file  Physical Activity: Not on file  Stress: Not on file  Social Connections: Not on file    Allergies  Allergen Reactions   Penicillins Swelling    Has patient had a PCN reaction causing immediate rash, facial/tongue/throat swelling, SOB or lightheadedness with hypotension: Yes Has patient had a PCN reaction causing severe rash involving mucus membranes or skin necrosis: No Has patient had a PCN reaction that required hospitalization: No Has patient had a PCN reaction occurring within the last 10 years: No If all of the above answers are "NO", then may proceed with Cephalosporin use.    Childhood allergy.   Almond Oil Swelling   Peanuts [Peanut Oil] Swelling    Facial swelling   Morphine And Codeine Nausea And Vomiting   Strawberry Extract Other (See Comments)    Dizziness.    Tramadol Nausea Only    Outpatient Medications Prior to Visit  Medication Sig Dispense  Refill   aspirin EC 81 MG tablet Take 1 tablet (81 mg total) by mouth daily. Swallow whole. (Patient not taking: Reported on 10/17/2022) 30 tablet 1   atorvastatin (LIPITOR) 40 MG tablet Take 1 tablet (40 mg total) by mouth daily. (Patient not taking: Reported on 10/17/2022) 30 tablet 1   cetirizine (ZYRTEC) 10 MG tablet Take 1 tablet (10 mg total) by mouth daily. (Patient not taking: Reported on 10/17/2022) 30 tablet 11   clopidogrel (PLAVIX) 75 MG tablet Take 1 tablet (75 mg total) by mouth daily. Take for 21 days along with aspirin-after 21 days stop Plavix and continue aspirin. (Patient not taking: Reported on 10/17/2022) 21 tablet 0   empagliflozin (JARDIANCE) 10 MG TABS tablet Take 1 tablet (10 mg  total) by mouth daily. (Patient not taking: Reported on 10/17/2022) 30 tablet 1   EPINEPHrine 0.3 mg/0.3 mL IJ SOAJ injection Inject 0.3 mg into the muscle as needed for anaphylaxis. (Patient not taking: Reported on 10/17/2022) 2 each 1   EPINEPHrine 0.3 mg/0.3 mL IJ SOAJ injection Inject 0.3 mg into the muscle as needed for anaphylaxis. (Patient not taking: Reported on 10/17/2022) 1 each 1   pantoprazole (PROTONIX) 40 MG tablet Take 1 tablet (40 mg total) by mouth daily. (Patient not taking: Reported on 10/17/2022) 30 tablet 1   No facility-administered medications prior to visit.     ROS Review of Systems  Constitutional:  Negative for activity change and appetite change.  HENT:  Negative for sinus pressure and sore throat.   Respiratory:  Negative for chest tightness, shortness of breath and wheezing.   Cardiovascular:  Negative for chest pain and palpitations.  Gastrointestinal:  Negative for abdominal distention, abdominal pain and constipation.  Genitourinary: Negative.   Musculoskeletal: Negative.   Psychiatric/Behavioral:  Negative for behavioral problems and dysphoric mood.     Objective:  BP 119/74   Pulse 72   Temp 98.2 F (36.8 C) (Oral)   Ht 5\' 3"  (1.6 m)   Wt 191 lb 6.4 oz (86.8 kg)   LMP 08/12/2014 (Exact Date)   SpO2 98%   BMI 33.90 kg/m      10/17/2022    3:10 PM 10/13/2022   10:00 PM 10/13/2022    8:27 PM  BP/Weight  Systolic BP 119 157 113  Diastolic BP 74 74 53  Wt. (Lbs) 191.4    BMI 33.9 kg/m2        Physical Exam Constitutional:      Appearance: She is well-developed.  HENT:     Right Ear: Tympanic membrane normal.     Left Ear: Tympanic membrane normal.     Mouth/Throat:     Mouth: Mucous membranes are moist.  Cardiovascular:     Rate and Rhythm: Normal rate.     Heart sounds: Normal heart sounds. No murmur heard. Pulmonary:     Effort: Pulmonary effort is normal.     Breath sounds: Normal breath sounds. No wheezing or rales.  Chest:      Chest wall: No tenderness.  Abdominal:     General: Bowel sounds are normal. There is no distension.     Palpations: Abdomen is soft. There is no mass.     Tenderness: There is no abdominal tenderness.  Musculoskeletal:        General: Normal range of motion.     Right lower leg: No edema.     Left lower leg: No edema.  Neurological:     Mental Status: She is alert  and oriented to person, place, and time.  Psychiatric:        Mood and Affect: Mood normal.        Latest Ref Rng & Units 10/13/2022    6:28 PM 09/30/2022    9:41 AM 09/29/2022    5:50 PM  CMP  Glucose 70 - 99 mg/dL 161  096    BUN 8 - 23 mg/dL 10  7    Creatinine 0.45 - 1.00 mg/dL 4.09  8.11  9.14   Sodium 135 - 145 mmol/L 138  134    Potassium 3.5 - 5.1 mmol/L 3.5  3.9    Chloride 98 - 111 mmol/L 102  104    CO2 22 - 32 mmol/L 25  23    Calcium 8.9 - 10.3 mg/dL 9.6  8.7      Lipid Panel     Component Value Date/Time   CHOL 198 09/30/2022 0941   TRIG 94 09/30/2022 0941   HDL 43 09/30/2022 0941   CHOLHDL 4.6 09/30/2022 0941   VLDL 19 09/30/2022 0941   LDLCALC 136 (H) 09/30/2022 0941    CBC    Component Value Date/Time   WBC 7.9 10/13/2022 1828   RBC 5.20 (H) 10/13/2022 1828   HGB 13.6 10/13/2022 1828   HCT 43.9 10/13/2022 1828   PLT 267 10/13/2022 1828   MCV 84.4 10/13/2022 1828   MCH 26.2 10/13/2022 1828   MCHC 31.0 10/13/2022 1828   RDW 13.3 10/13/2022 1828   LYMPHSABS 2.1 10/13/2022 1828   MONOABS 0.5 10/13/2022 1828   EOSABS 0.2 10/13/2022 1828   BASOSABS 0.0 10/13/2022 1828    Lab Results  Component Value Date   HGBA1C 8.9 (H) 09/29/2022    Assessment & Plan:  1. Type 2 diabetes mellitus with hyperglycemia, unspecified whether long term insulin use (HCC) Uncontrolled with A1c of 8.9 She should be on Jardiance but due to allergic reaction has not been taking any medication Instruction has been provided regarding restarting Jardiance Counseled on Diabetic diet, my plate method, 782  minutes of moderate intensity exercise/week Blood sugar logs with fasting goals of 80-120 mg/dl, random of less than 956 and in the event of sugars less than 60 mg/dl or greater than 213 mg/dl encouraged to notify the clinic. Advised on the need for annual eye exams, annual foot exams, Pneumonia vaccine.  2. Allergic reaction, initial encounter Secondary to medication and unfortunately culprit medication is unknown at the moment Would love to refer to an allergist for allergy testing but she declines We have discussed a systematic process for reinitiating her medications. I have completed the patient assistance program form so she can obtain an EpiPen as she has no medical coverage for this Once she has the EpiPen she will restart Plavix for 5 days and then add on aspirin for 5 days then her Lipitor and finally Jardiance.   If she begins to develop symptoms she has been advised to stop the medication and administer EpiPen and notify me as well.  Her grandson lives with her and she has been advised to make him aware so he is on the look out as well  3. TIA (transient ischemic attack) CT head findings in keeping with stroke however on performing MRI 24 hours later finding had resolved during hospitalization Plan was for her to take Plavix and aspirin for 3 weeks then continue with aspirin Unfortunately due to allergic reaction she has only been on Plavix for 2 weeks She will resume  Plavix to complete the 1 additional week and then continue with aspirin Secondary risk prevention including statin which she will resume after she has been able to tolerate aspirin and Plavix    No orders of the defined types were placed in this encounter.   Follow-up: Return in about 1 month (around 11/17/2022) for Chronic medical conditions.       Hoy Register, MD, FAAFP. [facility] [place], [place] [phone]   10/17/2022, 6:05 PM

## 2022-10-17 NOTE — Progress Notes (Signed)
Not taking any medications.

## 2022-10-17 NOTE — Patient Instructions (Signed)
Drug Allergy  A drug allergy happens when the body's disease-fighting system (immune system) reacts badly to a medicine. Drug allergies range from mild to severe. A drug allergy is not the same as a medicine side effect, which is a known possible reaction to the drug. A drug allergy is also different from medicine toxicity caused by an overdose of the drug. The time of an allergic reaction varies. Symptoms often appear between 1 to 2 hours after taking the medicine; however, some allergic reactions occur 1 week or more after you are exposed to a medicine (delayed reaction). A sudden (acute), severe allergic reaction that affects multiple areas of the body is called an anaphylactic reaction (anaphylaxis). Anaphylaxis can be life-threatening. All allergic reactions to a medicine require medical evaluation, even if the allergic reaction appears to be mild. What are the causes? This condition is caused by the immune system wrongly identifying a medication as being harmful. When this happens, the body releases proteins (antibodies) and other compounds, such as histamine, into the bloodstream. This causes swelling in certain tissues and reduces blood flow to important areas, such as the heart and lungs. Almost any medicine can cause an allergic reaction. Medicines that commonly cause allergic reactions (common allergens) include: Antibiotics, such as penicillin. Sulfa medicines (sulfonamides). Medicines that numb certain areas of the body (local anesthetics). X-ray dyes that contain iodine. Pain-relievers. This includes aspirin and NSAIDs, such as ibuprofen or naproxen sodium. Chemotherapy drugs for treating cancer. Medicines for autoimmune diseases, such as rheumatoid arthritis. What are the signs or symptoms? Common symptoms of a mild allergic reaction include: Nasal congestion. Tingling in the mouth or tongue. An itchy, red rash. Common symptoms of a severe allergic reaction include: Swelling of the  face, eyes, lips, or tongue, including the back of the mouth and throat. Difficulty speaking (hoarseness) or swallowing, or making high-pitched whistling sounds, most often when you breathe out (wheezing). Itchy, red, swollen areas of skin (hives). Dizziness, light-headedness, or fainting. Anxiety or confusion. Chest tightness and fast or irregular heartbeats (palpitations). Abdominal pain, vomiting, or diarrhea. How is this diagnosed? This condition is diagnosed based on a physical exam and your history of recent exposure to one or more medicines. You may be referred for follow-up testing by a health care provider who specializes in allergies. This testing can confirm the diagnosis of a drug allergy and determine which medicines you are allergic to. Testing may include: Skin tests. These may involve: Injecting a small amount of the possible allergen between layers of your skin (intradermal injection). Applying patches to your skin. Blood tests. Drug challenge. For this test, a health care provider gives you a small amount of a medicine in gradual doses while watching for an allergic reaction. If you are unsure of what caused your allergic reaction, your health care provider may ask you for: Information about all medicines that you take on a regular basis. The date and time of your reaction. How is this treated? There is no cure for allergies. However, an allergic reaction can be treated with: Medicines that help: Reduce pain and swelling (NSAIDs). Relieve itching and hives (antihistamines). Reduce swelling (corticosteroids). Respiratory inhalers. These are inhaled medicines that help open (dilate) the airways in your lungs. Injections of medicine that helps to relax the muscles in your airways and tighten your blood vessels (epinephrine). Severe allergic reactions, such as anaphylaxis, require immediate treatment in a hospital. You may need to be hospitalized for observation. You may also  be prescribed rescue medicines,   such as epinephrine. Epinephrine comes in many forms, including what is commonly called an auto-injector "pen" (pre-filled automatic epinephrine injection device). Follow these instructions at home: If you have a severe allergy  Always keep an auto-injector pen or your anaphylaxis kit near you. This can be lifesaving if you have a severe reaction. Use your auto-injector pen or anaphylaxis kit as told by your health care provider. Make sure that you, the members of your household, and your employer know: How to use your auto-injector pen or anaphylaxis kit. How to use your auto-injector pen to give you an epinephrine injection. Replace your auto-injector pen or anaphylaxis kit immediately after use, in case you have another reaction. Wear a medical alert bracelet or necklace that states your drug allergy, if told by your health care provider. General instructions Avoid medicines that you are allergic to. Take over-the-counter and prescription medicines only as told by your health care provider. If you were given medicines to treat your allergic reaction, do not drive until your health care provider tells you it is safe. If you have hives or a rash: Use an over-the-counter antihistamine as told by your health care provider. Apply cold, wet cloths (cold compresses) to your skin or take baths or showers in cool water. Avoid hot water. If you had tests done, it is up to you to get your test results. Ask your health care provider when your results will be ready. Tell all your health care providers that you have a drug allergy. Keep all follow-up visits This is important. Contact a health care provider if: You think that you are having a mild allergic reaction. Symptoms of an allergic reaction usually start within 1 hour after you are exposed to a medicine. You have symptoms that last more than 2 days after your reaction. You develop new signs or symptoms. Get help  right away if: You needed to use epinephrine. An epinephrine injection helps to manage life-threatening allergic reactions, but you still need to go to the emergency room even if epinephrine seems to work. This is important because anaphylaxis may happen again within 72 hours (rebound anaphylaxis). If you used epinephrine to treat anaphylaxis outside of the hospital, you need additional medical care. This may include more doses of epinephrine. You develop signs or symptoms of a severe allergic reaction. These symptoms may represent a serious problem that is an emergency. Do not wait to see if the symptoms will go away. Use your auto-injector pen or anaphylaxis kit as you have been instructed, and get medical help right away. Call your local emergency services (911 in the [place] Do not drive yourself to the hospital. Summary A drug allergy happens when the body's disease-fighting system reacts badly to a medicine. Drug allergies range from mild to severe. In some cases, an allergic reaction may be life-threatening. If you have a severe allergy, always keep an auto-injector pen or your anaphylaxis kit near you. This information is not intended to replace advice given to you by your health care provider. Make sure you discuss any questions you have with your health care provider. Document Revised: 10/26/2020 Document Reviewed: 10/26/2020 Elsevier Patient Education  2023 [employer] Inc[employer]  

## 2022-10-18 ENCOUNTER — Other Ambulatory Visit: Payer: Self-pay

## 2022-10-20 ENCOUNTER — Ambulatory Visit: Payer: Self-pay | Admitting: Internal Medicine

## 2022-10-21 ENCOUNTER — Other Ambulatory Visit: Payer: Self-pay

## 2023-01-06 ENCOUNTER — Other Ambulatory Visit: Payer: Self-pay | Admitting: Pharmacist

## 2023-01-06 ENCOUNTER — Other Ambulatory Visit: Payer: Self-pay

## 2023-01-06 ENCOUNTER — Telehealth: Payer: Self-pay

## 2023-01-06 DIAGNOSIS — T7840XA Allergy, unspecified, initial encounter: Secondary | ICD-10-CM

## 2023-01-06 MED ORDER — PANTOPRAZOLE SODIUM 40 MG PO TBEC
40.0000 mg | DELAYED_RELEASE_TABLET | Freq: Every day | ORAL | 0 refills | Status: DC
  Filled 2023-01-06: qty 30, 30d supply, fill #0

## 2023-01-06 MED ORDER — ATORVASTATIN CALCIUM 40 MG PO TABS
40.0000 mg | ORAL_TABLET | Freq: Every day | ORAL | 0 refills | Status: DC
  Filled 2023-01-06: qty 30, 30d supply, fill #0

## 2023-01-06 MED ORDER — EMPAGLIFLOZIN 10 MG PO TABS
10.0000 mg | ORAL_TABLET | Freq: Every day | ORAL | 0 refills | Status: DC
  Filled 2023-01-06: qty 30, 30d supply, fill #0

## 2023-01-06 MED ORDER — CETIRIZINE HCL 10 MG PO TABS
10.0000 mg | ORAL_TABLET | Freq: Every day | ORAL | 0 refills | Status: DC
  Filled 2023-01-06: qty 30, 30d supply, fill #0

## 2023-01-06 MED ORDER — ASPIRIN 81 MG PO TBEC
81.0000 mg | DELAYED_RELEASE_TABLET | Freq: Every day | ORAL | 0 refills | Status: DC
Start: 2023-01-06 — End: 2023-03-13
  Filled 2023-01-06: qty 30, 30d supply, fill #0

## 2023-01-06 NOTE — Telephone Encounter (Signed)
Patient came in stating she needs a letter saying when she was last here for a hospital follow up 10/17/2022, she needs the letter to state that she had an allergic reaction to the medication she was taking at that time and that she also had a stroke. She says the letter needs to go to [facility] Attn: Anola Gurney they are trying to help her pay her rent, she also requests the letter to be sent to her email [email or web address]

## 2023-01-06 NOTE — Telephone Encounter (Signed)
Routing to PCP for review.

## 2023-01-09 ENCOUNTER — Other Ambulatory Visit: Payer: Self-pay

## 2023-01-09 NOTE — Telephone Encounter (Signed)
Letter is ready.

## 2023-01-09 NOTE — Telephone Encounter (Signed)
Pt was called and informed of letter being ready for pick up.

## 2023-01-24 ENCOUNTER — Ambulatory Visit: Payer: Medicaid Other | Admitting: Family Medicine

## 2023-03-13 ENCOUNTER — Other Ambulatory Visit: Payer: Self-pay

## 2023-03-13 ENCOUNTER — Ambulatory Visit: Payer: Medicaid Other | Attending: Family Medicine | Admitting: Family Medicine

## 2023-03-13 ENCOUNTER — Encounter: Payer: Self-pay | Admitting: Family Medicine

## 2023-03-13 VITALS — BP 127/72 | HR 77 | Ht 63.0 in | Wt 188.4 lb

## 2023-03-13 DIAGNOSIS — Z1159 Encounter for screening for other viral diseases: Secondary | ICD-10-CM

## 2023-03-13 DIAGNOSIS — R413 Other amnesia: Secondary | ICD-10-CM

## 2023-03-13 DIAGNOSIS — G459 Transient cerebral ischemic attack, unspecified: Secondary | ICD-10-CM | POA: Diagnosis not present

## 2023-03-13 DIAGNOSIS — R609 Edema, unspecified: Secondary | ICD-10-CM

## 2023-03-13 DIAGNOSIS — E1165 Type 2 diabetes mellitus with hyperglycemia: Secondary | ICD-10-CM | POA: Diagnosis not present

## 2023-03-13 DIAGNOSIS — Z7984 Long term (current) use of oral hypoglycemic drugs: Secondary | ICD-10-CM

## 2023-03-13 LAB — POCT GLYCOSYLATED HEMOGLOBIN (HGB A1C): HbA1c, POC (controlled diabetic range): 7.6 % — AB (ref 0.0–7.0)

## 2023-03-13 MED ORDER — ATORVASTATIN CALCIUM 40 MG PO TABS: 40.0000 mg | ORAL_TABLET | Freq: Every day | ORAL | 1 refills | Status: DC

## 2023-03-13 MED ORDER — ATORVASTATIN CALCIUM 40 MG PO TABS
40.0000 mg | ORAL_TABLET | Freq: Every day | ORAL | 1 refills | Status: DC
  Filled 2023-03-13 – 2023-03-15 (×2): qty 90, 90d supply, fill #0

## 2023-03-13 MED ORDER — ASPIRIN 81 MG PO TBEC
81.0000 mg | DELAYED_RELEASE_TABLET | Freq: Every day | ORAL | 1 refills | Status: AC
  Filled 2023-03-13: qty 30, 30d supply, fill #0
  Filled 2023-04-21: qty 60, 60d supply, fill #1

## 2023-03-13 MED ORDER — EMPAGLIFLOZIN 25 MG PO TABS
25.0000 mg | ORAL_TABLET | Freq: Every day | ORAL | 1 refills | Status: DC
  Filled 2023-03-13 – 2023-03-15 (×2): qty 90, 90d supply, fill #0

## 2023-03-13 MED ORDER — EMPAGLIFLOZIN 25 MG PO TABS: 25.0000 mg | ORAL_TABLET | Freq: Every day | ORAL | 1 refills | Status: DC

## 2023-03-13 NOTE — Progress Notes (Unsigned)
Subjective:  Patient ID: Heather Cook, female    DOB: May 27, 1961  Age: 62 y.o. MRN: [id number]  CC: Medical Management of Chronic Issues   HPI Heather Cook is a 62 y.o. year old female with a history of GERD, TIA, type 2 diabetes mellitus (A1c 7.6)    Interval History: Discussed the use of AI scribe software for clinical note transcription with the patient, who gave verbal consent to proceed.  History of Present Illness            Past Medical History:  Diagnosis Date  . Diabetes mellitus without complication (HCC)   . GERD (gastroesophageal reflux disease)   . H. pylori infection   . History of stomach ulcers   . Sciatica     Past Surgical History:  Procedure Laterality Date  . TUBAL LIGATION      Family History  Adopted: Yes  Problem Relation Age of Onset  . Dementia Mother   . Heart attack Mother     Social History   Socioeconomic History  . Marital status: Single    Spouse name: Not on file  . Number of children: Not on file  . Years of education: Not on file  . Highest education level: Not on file  Occupational History  . Not on file  Tobacco Use  . Smoking status: Never  . Smokeless tobacco: Never  Vaping Use  . Vaping status: Not on file  Substance and Sexual Activity  . Alcohol use: No  . Drug use: No  . Sexual activity: Never  Other Topics Concern  . Not on file  Social History Narrative   Right Handed    Lives in apartment on the 3rd floor. One story floor plan   Social Determinants of Health   Financial Resource Strain: Not on file  Food Insecurity: Not on file  Transportation Needs: Not on file  Physical Activity: Not on file  Stress: Not on file  Social Connections: Not on file    Allergies  Allergen Reactions  . Penicillins Swelling    Has patient had a PCN reaction causing immediate rash, facial/tongue/throat swelling, SOB or lightheadedness with hypotension: Yes Has patient had a PCN reaction causing severe rash involving  mucus membranes or skin necrosis: No Has patient had a PCN reaction that required hospitalization: No Has patient had a PCN reaction occurring within the last 10 years: No If all of the above answers are "NO", then may proceed with Cephalosporin use.    Childhood allergy.  Barron Schmid Oil Swelling  . Peanuts [Peanut Oil] Swelling    Facial swelling  . Morphine And Codeine Nausea And Vomiting  . Strawberry Extract Other (See Comments)    Dizziness.   . Tramadol Nausea Only    Outpatient Medications Prior to Visit  Medication Sig Dispense Refill  . aspirin EC 81 MG tablet Take 1 tablet (81 mg total) by mouth daily. Swallow whole. 30 tablet 0  . atorvastatin (LIPITOR) 40 MG tablet Take 1 tablet (40 mg total) by mouth daily. 30 tablet 0  . cetirizine (ZYRTEC) 10 MG tablet Take 1 tablet (10 mg total) by mouth daily. 30 tablet 0  . clopidogrel (PLAVIX) 75 MG tablet Take 1 tablet (75 mg total) by mouth daily. Take for 21 days along with aspirin-after 21 days stop Plavix and continue aspirin. (Patient not taking: Reported on 10/17/2022) 21 tablet 0  . empagliflozin (JARDIANCE) 10 MG TABS tablet Take 1 tablet (10 mg total) by mouth daily.  30 tablet 0  . EPINEPHrine 0.3 mg/0.3 mL IJ SOAJ injection Inject 0.3 mg into the muscle as needed for anaphylaxis. (Patient not taking: Reported on 10/17/2022) 2 each 1  . EPINEPHrine 0.3 mg/0.3 mL IJ SOAJ injection Inject 0.3 mg into the muscle as needed for anaphylaxis. (Patient not taking: Reported on 10/17/2022) 2 each 1  . pantoprazole (PROTONIX) 40 MG tablet Take 1 tablet (40 mg total) by mouth daily. 30 tablet 0   No facility-administered medications prior to visit.     ROS Review of Systems *** Objective:  BP 127/72   Pulse 77   Ht 5\' 3"  (1.6 m)   Wt 188 lb 6.4 oz (85.5 kg)   LMP 08/12/2014 (Exact Date)   SpO2 97%   BMI 33.37 kg/m      03/13/2023    3:38 PM 10/17/2022    3:10 PM 10/13/2022   10:00 PM  BP/Weight  Systolic BP 127 119 157   Diastolic BP 72 74 74  Wt. (Lbs) 188.4 191.4   BMI 33.37 kg/m2 33.9 kg/m2       Physical Exam ***    Latest Ref Rng & Units 10/13/2022    6:28 PM 09/30/2022    9:41 AM 09/29/2022    5:50 PM  CMP  Glucose 70 - 99 mg/dL 578  469    BUN 8 - 23 mg/dL 10  7    Creatinine 6.29 - 1.00 mg/dL 5.28  4.13  2.44   Sodium 135 - 145 mmol/L 138  134    Potassium 3.5 - 5.1 mmol/L 3.5  3.9    Chloride 98 - 111 mmol/L 102  104    CO2 22 - 32 mmol/L 25  23    Calcium 8.9 - 10.3 mg/dL 9.6  8.7      Lipid Panel     Component Value Date/Time   CHOL 198 09/30/2022 0941   TRIG 94 09/30/2022 0941   HDL 43 09/30/2022 0941   CHOLHDL 4.6 09/30/2022 0941   VLDL 19 09/30/2022 0941   LDLCALC 136 (H) 09/30/2022 0941    CBC    Component Value Date/Time   WBC 7.9 10/13/2022 1828   RBC 5.20 (H) 10/13/2022 1828   HGB 13.6 10/13/2022 1828   HCT 43.9 10/13/2022 1828   PLT 267 10/13/2022 1828   MCV 84.4 10/13/2022 1828   MCH 26.2 10/13/2022 1828   MCHC 31.0 10/13/2022 1828   RDW 13.3 10/13/2022 1828   LYMPHSABS 2.1 10/13/2022 1828   MONOABS 0.5 10/13/2022 1828   EOSABS 0.2 10/13/2022 1828   BASOSABS 0.0 10/13/2022 1828    Lab Results  Component Value Date   HGBA1C 7.6 (A) 03/13/2023    Assessment & Plan:  Assessment and Plan               No orders of the defined types were placed in this encounter.   Follow-up: No follow-ups on file.       Hoy Register, MD, FAAFP. [facility] [place], [place] [phone]   03/13/2023, 3:40 PM

## 2023-03-13 NOTE — Patient Instructions (Signed)
Edema  Edema is when you have too much fluid in your body or under your skin. Edema may make your legs, feet, and ankles swell. Swelling often happens in looser tissues, such as around your eyes. This is a common condition. It gets more common as you get older. There are many possible causes of edema. These include: Eating too much salt (sodium). Being on your feet or sitting for a long time. Certain medical conditions, such as: Pregnancy. Heart failure. Liver disease. Kidney disease. Cancer. Hot weather may make edema worse. Edema is usually painless. Your skin may look swollen or shiny. Follow these instructions at home: Medicines Take over-the-counter and prescription medicines only as told by your doctor. Your doctor may prescribe a medicine to help your body get rid of extra water (diuretic). Take this medicine if you are told to take it. Eating and drinking Eat a low-salt (low-sodium) diet as told by your doctor. Sometimes, eating less salt may reduce swelling. Depending on the cause of your swelling, you may need to limit how much fluid you drink (fluid restriction). General instructions Raise the injured area above the level of your heart while you are sitting or lying down. Do not sit still or stand for a long time. Do not wear tight clothes. Do not wear garters on your upper legs. Exercise your legs. This can help the swelling go down. Wear compression stockings as told by your doctor. It is important that these are the right size. These should be prescribed by your doctor to prevent possible injuries. If elastic bandages or wraps are recommended, use them as told by your doctor. Contact a doctor if: Treatment is not working. You have heart, liver, or kidney disease and have symptoms of edema. You have sudden and unexplained weight gain. Get help right away if: You have shortness of breath or chest pain. You cannot breathe when you lie down. You have pain, redness, or  warmth in the swollen areas. You have heart, liver, or kidney disease and get edema all of a sudden. You have a fever and your symptoms get worse all of a sudden. These symptoms may be an emergency. Get help right away. Call 911. Do not wait to see if the symptoms will go away. Do not drive yourself to the hospital. Summary Edema is when you have too much fluid in your body or under your skin. Edema may make your legs, feet, and ankles swell. Swelling often happens in looser tissues, such as around your eyes. Raise the injured area above the level of your heart while you are sitting or lying down. Follow your doctor's instructions about diet and how much fluid you can drink. This information is not intended to replace advice given to you by your health care provider. Make sure you discuss any questions you have with your health care provider. Document Revised: 01/18/2021 Document Reviewed: 01/18/2021 Elsevier Patient Education  2024 [employer].

## 2023-03-14 LAB — CMP14+EGFR
ALT: 17 [IU]/L (ref 0–32)
AST: 20 [IU]/L (ref 0–40)
Albumin: 4.4 g/dL (ref 3.9–4.9)
Alkaline Phosphatase: 105 [IU]/L (ref 44–121)
BUN/Creatinine Ratio: 12 (ref 12–28)
BUN: 9 mg/dL (ref 8–27)
Bilirubin Total: 0.3 mg/dL (ref 0.0–1.2)
CO2: 22 mmol/L (ref 20–29)
Calcium: 9.6 mg/dL (ref 8.7–10.3)
Chloride: 104 mmol/L (ref 96–106)
Creatinine, Ser: 0.75 mg/dL (ref 0.57–1.00)
Globulin, Total: 2.9 g/dL (ref 1.5–4.5)
Glucose: 123 mg/dL — ABNORMAL HIGH (ref 70–99)
Potassium: 4.2 mmol/L (ref 3.5–5.2)
Sodium: 142 mmol/L (ref 134–144)
Total Protein: 7.3 g/dL (ref 6.0–8.5)
eGFR: 90 mL/min/{1.73_m2} (ref >59)

## 2023-03-14 LAB — HCV AB W REFLEX TO QUANT PCR: HCV Ab: NONREACTIVE

## 2023-03-14 LAB — HCV INTERPRETATION

## 2023-03-15 ENCOUNTER — Other Ambulatory Visit: Payer: Self-pay

## 2023-04-10 ENCOUNTER — Encounter: Payer: Self-pay | Admitting: Physician Assistant

## 2023-04-10 ENCOUNTER — Ambulatory Visit (INDEPENDENT_AMBULATORY_CARE_PROVIDER_SITE_OTHER): Payer: Medicaid Other | Admitting: Physician Assistant

## 2023-04-10 ENCOUNTER — Other Ambulatory Visit (INDEPENDENT_AMBULATORY_CARE_PROVIDER_SITE_OTHER): Payer: Medicaid Other

## 2023-04-10 VITALS — BP 104/67 | HR 92 | Ht 63.5 in | Wt 188.0 lb

## 2023-04-10 DIAGNOSIS — R413 Other amnesia: Secondary | ICD-10-CM | POA: Insufficient documentation

## 2023-04-10 DIAGNOSIS — R2990 Unspecified symptoms and signs involving the nervous system: Secondary | ICD-10-CM | POA: Diagnosis not present

## 2023-04-10 LAB — VITAMIN B12: Vitamin B-12: 293 pg/mL (ref 211–911)

## 2023-04-10 LAB — TSH: TSH: 1.48 u[IU]/mL (ref 0.35–5.50)

## 2023-04-10 NOTE — Patient Instructions (Addendum)
It was a pleasure to see you today at our office.   Recommendations:   Check labs today  Follow up in 6  months Consider psychotherapy for stress management   For psychiatric meds, mood meds: Please have your primary care physician manage these medications.  If you have any severe symptoms of a stroke, or other severe issues such as confusion,severe chills or fever, etc call 911 or go to the ER as you may need to be evaluated further     For assessment of decision of mental capacity and competency:  Call Dr. Erick Blinks, geriatric psychiatrist at [phone]  Counseling regarding caregiver distress, including caregiver depression, anxiety and issues regarding community resources, adult day care programs, adult living facilities, or memory care questions:  please contact your  Primary Doctor's Social Worker   Whom to call: Memory  decline, memory medications: Call our office [phone]    [email or web address]   RECOMMENDATIONS FOR ALL PATIENTS WITH MEMORY PROBLEMS: 1. Continue to exercise (Recommend 30 minutes of walking everyday, or 3 hours every week) 2. Increase social interactions - continue going to [place] and enjoy social gatherings with friends and family 3. Eat healthy, avoid fried foods and eat more fruits and vegetables 4. Maintain adequate blood pressure, blood sugar, and blood cholesterol level. Reducing the risk of stroke and cardiovascular disease also helps promoting better memory. 5. Avoid stressful situations. Live a simple life and avoid aggravations. Organize your time and prepare for the next day in anticipation. 6. Sleep well, avoid any interruptions of sleep and avoid any distractions in the bedroom that may interfere with adequate sleep quality 7. Avoid sugar, avoid sweets as there is a strong link between excessive sugar intake, diabetes, and cognitive impairment We discussed the Mediterranean diet,  which has been shown to help patients reduce the risk of progressive memory disorders and reduces cardiovascular risk. This includes eating fish, eat fruits and green leafy vegetables, nuts like almonds and hazelnuts, walnuts, and also use olive oil. Avoid fast foods and fried foods as much as possible. Avoid sweets and sugar as sugar use has been linked to worsening of memory function.  There is always a concern of gradual progression of memory problems. If this is the case, then we may need to adjust level of care according to patient needs. Support, both to the patient and caregiver, should then be put into place.      You have been referred for a neuropsychological evaluation (i.e., evaluation of memory and thinking abilities). Please bring someone with you to this appointment if possible, as it is helpful for the doctor to hear from both you and another adult who knows you well. Please bring eyeglasses and hearing aids if you wear them.    The evaluation will take approximately 3 hours and has two parts:   The first part is a clinical interview with the neuropsychologist (Dr. Milbert Coulter or Dr. Roseanne Reno). During the interview, the neuropsychologist will speak with you and the individual you brought to the appointment.    The second part of the evaluation is testing with the doctor's technician Annabelle Harman or Selena Batten). During the testing, the technician will ask you to remember different types of material, solve problems, and answer some questionnaires. Your family member will not be present for this portion of the evaluation.   Please note: We must reserve several hours of the neuropsychologist's time and the psychometrician's time for your evaluation appointment. As such, there is a No-Show fee of $100. If  you are unable to attend any of your appointments, please contact our office as soon as possible to reschedule.      DRIVING: Regarding driving, in patients with progressive memory problems, driving will  be impaired. We advise to have someone else do the driving if trouble finding directions or if minor accidents are reported. Independent driving assessment is available to determine safety of driving.   If you are interested in the driving assessment, you can contact the following:  [facility] in [place] [phone]  [facility] [phone]  [facility] [phone]  [facility] [phone] or [phone]   FALL PRECAUTIONS: Be cautious when walking. Scan the area for obstacles that may increase the risk of trips and falls. When getting up in the mornings, sit up at the edge of the bed for a few minutes before getting out of bed. Consider elevating the bed at the head end to avoid drop of blood pressure when getting up. Walk always in a well-lit room (use night lights in the walls). Avoid area rugs or power cords from appliances in the middle of the walkways. Use a walker or a cane if necessary and consider physical therapy for balance exercise. Get your eyesight checked regularly.  FINANCIAL OVERSIGHT: Supervision, especially oversight when making financial decisions or transactions is also recommended.  HOME SAFETY: Consider the safety of the kitchen when operating appliances like stoves, microwave oven, and blender. Consider having supervision and share cooking responsibilities until no longer able to participate in those. Accidents with firearms and other hazards in the house should be identified and addressed as well.   ABILITY TO BE LEFT ALONE: If patient is unable to contact 911 operator, consider using [employer], or when the need is there, arrange for someone to stay with patients. Smoking is a fire hazard, consider supervision or cessation. Risk of wandering should be assessed by caregiver and if detected at any point, supervision and safe proof recommendations should be instituted.  MEDICATION SUPERVISION: Inability to  self-administer medication needs to be constantly addressed. Implement a mechanism to ensure safe administration of the medications.      Mediterranean Diet A Mediterranean diet refers to food and lifestyle choices that are based on the traditions of countries located on the [employer] [place]. This way of eating has been shown to help prevent certain conditions and improve outcomes for people who have chronic diseases, like kidney disease and heart disease. What are tips for following this plan? Lifestyle  Cook and eat meals together with your family, when possible. Drink enough fluid to keep your urine clear or pale yellow. Be physically active every day. This includes: Aerobic exercise like running or swimming. Leisure activities like gardening, walking, or housework. Get 7-8 hours of sleep each night. If recommended by your health care provider, drink red wine in moderation. This means 1 glass a day for nonpregnant women and 2 glasses a day for men. A glass of wine equals 5 oz (150 mL). Reading food labels  Check the serving size of packaged foods. For foods such as rice and pasta, the serving size refers to the amount of cooked product, not dry. Check the total fat in packaged foods. Avoid foods that have saturated fat or trans fats. Check the ingredients list for added sugars, such as corn syrup. Shopping  At the grocery store, buy most of your food from the areas near the walls of the store. This includes: Fresh fruits and vegetables (produce). Grains, beans, nuts,  and seeds. Some of these may be available in unpackaged forms or large amounts (in bulk). Fresh seafood. Poultry and eggs. Low-fat dairy products. Buy whole ingredients instead of prepackaged foods. Buy fresh fruits and vegetables in-season from local farmers markets. Buy frozen fruits and vegetables in resealable bags. If you do not have access to quality fresh seafood, buy precooked frozen shrimp or canned fish, such  as tuna, salmon, or sardines. Buy small amounts of raw or cooked vegetables, salads, or olives from the deli or salad bar at your store. Stock your pantry so you always have certain foods on hand, such as olive oil, canned tuna, canned tomatoes, rice, pasta, and beans. Cooking  Cook foods with extra-virgin olive oil instead of using butter or other vegetable oils. Have meat as a side dish, and have vegetables or grains as your main dish. This means having meat in small portions or adding small amounts of meat to foods like pasta or stew. Use beans or vegetables instead of meat in common dishes like chili or lasagna. Experiment with different cooking methods. Try roasting or broiling vegetables instead of steaming or sauteing them. Add frozen vegetables to soups, stews, pasta, or rice. Add nuts or seeds for added healthy fat at each meal. You can add these to yogurt, salads, or vegetable dishes. Marinate fish or vegetables using olive oil, lemon juice, garlic, and fresh herbs. Meal planning  Plan to eat 1 vegetarian meal one day each week. Try to work up to 2 vegetarian meals, if possible. Eat seafood 2 or more times a week. Have healthy snacks readily available, such as: Vegetable sticks with hummus. Greek yogurt. Fruit and nut trail mix. Eat balanced meals throughout the week. This includes: Fruit: 2-3 servings a day Vegetables: 4-5 servings a day Low-fat dairy: 2 servings a day Fish, poultry, or lean meat: 1 serving a day Beans and legumes: 2 or more servings a week Nuts and seeds: 1-2 servings a day Whole grains: 6-8 servings a day Extra-virgin olive oil: 3-4 servings a day Limit red meat and sweets to only a few servings a month What are my food choices? Mediterranean diet Recommended Grains: Whole-grain pasta. Brown rice. Bulgar wheat. Polenta. Couscous. Whole-wheat bread. Orpah Cobb. Vegetables: Artichokes. Beets. Broccoli. Cabbage. Carrots. Eggplant. Green beans. Chard.  Kale. Spinach. Onions. Leeks. Peas. Squash. Tomatoes. Peppers. Radishes. Fruits: Apples. Apricots. Avocado. Berries. Bananas. Cherries. Dates. Figs. Grapes. Lemons. Melon. Oranges. Peaches. Plums. Pomegranate. Meats and other protein foods: Beans. Almonds. Sunflower seeds. Pine nuts. Peanuts. Cod. Salmon. Scallops. Shrimp. Tuna. Tilapia. Clams. Oysters. Eggs. Dairy: Low-fat milk. Cheese. Greek yogurt. Beverages: Water. Red wine. Herbal tea. Fats and oils: Extra virgin olive oil. Avocado oil. Grape seed oil. Sweets and desserts: [place] yogurt with honey. Baked apples. Poached pears. Trail mix. Seasoning and other foods: Basil. Cilantro. Coriander. Cumin. Mint. Parsley. Sage. Rosemary. Tarragon. Garlic. Oregano. Thyme. Pepper. Balsalmic vinegar. Tahini. Hummus. Tomato sauce. Olives. Mushrooms. Limit these Grains: Prepackaged pasta or rice dishes. Prepackaged cereal with added sugar. Vegetables: Deep fried potatoes (french fries). Fruits: Fruit canned in syrup. Meats and other protein foods: Beef. Pork. Lamb. Poultry with skin. Hot dogs. Tomasa Blase. Dairy: Ice cream. Sour cream. Whole milk. Beverages: Juice. Sugar-sweetened soft drinks. Beer. Liquor and spirits. Fats and oils: Butter. Canola oil. Vegetable oil. Beef fat (tallow). Lard. Sweets and desserts: Cookies. Cakes. Pies. Candy. Seasoning and other foods: Mayonnaise. Premade sauces and marinades. The items listed may not be a complete list. Talk with your dietitian about what dietary  choices are right for you. Summary The Mediterranean diet includes both food and lifestyle choices. Eat a variety of fresh fruits and vegetables, beans, nuts, seeds, and whole grains. Limit the amount of red meat and sweets that you eat. Talk with your health care provider about whether it is safe for you to drink red wine in moderation. This means 1 glass a day for nonpregnant women and 2 glasses a day for men. A glass of wine equals 5 oz (150 mL). This information  is not intended to replace advice given to you by your health care provider. Make sure you discuss any questions you have with your health care provider. Document Released: 01/07/2016 Document Revised: 02/09/2016 Document Reviewed: 01/07/2016 Elsevier Interactive Patient Education  2017 [employer].

## 2023-04-10 NOTE — Progress Notes (Signed)
[facility] Neurology Division Clinic Note - Progress note   Date: 04/10/23  Heather Cook MRN: [id number] DOB: 13-May-1961        IMPRESSION/PLAN:  Stroke like symptoms, concern for TIA versus complex migraine, resolved, no recurrence.   MRI brain, CTA head and neck unremarkable. 2 D echo without acute findings. LDL 136, A1C 8.9. No residual symptoms Recommend good control of cardiovascular risk factors.   Recommend secondary stroke prevention with high dose statin, baby ASA daily, BP meds  Recommend good control of her A1C  Increase physical activity Stress reduction is recommended, consider psychotherapy  Check TSH And B12 for memory changes  Follow up in 6 months to see if any new cognitive changes arise to entertain further workup.       History of Present Illness:  Heather Cook is a  delightful 62 y.o. R-handed female with history of DM2, GERD, hypertension, hyperlipidemia, presenting for evaluation of recent stroke like symptoms.  In review, the patient experienced acute episode of right arm weakness and numbness on 09/29/2022 while driving. Symptoms lasted about 8 hours, then resolved.  For about 2 months, the patient was having intermittent parietal headaches, quickly resolving, and at the time of presentation to the ER, she experienced a brief episode of this type of headaches (which she had contributed to stress).  CT of the head was concerning for acute  infarct in the left basal ganglia but the MRI of the brain was negative for acute CVA. CT angio of the head and neck was negative for stenosis or LVO. Other workup including echo was unremarkable.  Symptomatology, was suspicious for left brain TIA versus complicated migraine after evaluation by neurology at the hospital.  She was placed on aspirin and Plavix for 3 weeks, and then recommended to be on aspirin alone, high intensity statin's, and good control of the blood pressure. Patient  never had a similar episode.  Denies any history of TIA . In the past she may had vertigo, but denied dizziness or vision changes during the hospitalization. Denies dysarthria or dysphagia. No confusion or seizures. Denies any chest pain, or shortness of breath. Denies any fever or chills, had sweat prior to presentation.  No tobacco. No new meds or hormonal supplements. She is now on daily ASA a day and Plavix for 3 weeks, currently on d6. Denies any recent long distance trips or recent surgeries. No sick contacts. 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 She was not on meds. No family history of stroke, but Mother had MI.  No tPA was given, she was out of the window. She is diabetic and her A1C was elevated at 8.9.      Update 04/10/2023  Patient denies any new stroke like symptoms. 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 She is also estranged from daughter although she reports that "it is not a bad thing". She is mow living with her son. She is somewhat concern about her memory. Currently her MoCA is 26/30. Patient is able to participate on his ADLs and to drive without difficulties    How long did patient have memory difficulties? Around the same time of the stroke. "It is more with word retrieval". 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  Denies forgetting conversations , may not recall names, then again" there are new students every semester".  Long-term memory is good. repeats oneself?  Denies  Disoriented when walking into a  room?  Patient denies   Leaving objects in unusual places? Denies.   Wandering behavior?  denies .  Any personality changes?  Denies.   Any history of depression?:  Denies   Hallucinations or paranoia?  Denies   Seizures?  Denies    Any sleep changes? Does not sleep well  Sometimes vivid dreams, denies  REM behavior or sleepwalking   Sleep apnea?  Denies   Any hygiene concerns?  Denies   Independent of bathing and dressing?  Endorsed   Does the patient needs help with medications? Patient is in charge   Who is in charge of the finances? Patient is in charge .     Any changes in appetite?  Denies     Patient have trouble swallowing? Denies.   Does the patient cook? Yes, denies any issues Any kitchen accidents such as leaving the stove on? Denies.   Any history of headaches? In the past, stress related   Chronic pain ? Denies.   Ambulates with difficulty?  Denies.   Recent falls or head injuries? Denies.   Vision changes? Denies.   Unilateral weakness, numbness or tingling? Denies.   Any tremors?   Denies.   Any anosmia?  Denies.   Any incontinence of urine? Denies.   Any bowel dysfunction? Denies.      Patient lives with  grandson History of heavy alcohol intake? Denies.   History of heavy tobacco use? Denies.   Family history of dementia? Mo may have had dementia unknown type  Does patient drive? Yes, denies any issues       04/10/2023    2:00 PM  Montreal Cognitive Assessment   Visuospatial/ Executive (0/5) 5  Naming (0/3) 3  Attention: Read list of digits (0/2) 2  Attention: Read list of letters (0/1) 1  Attention: Serial 7 subtraction starting at 100 (0/3) 3  Language: Repeat phrase (0/2) 2  Language : Fluency (0/1) 1  Abstraction (0/2) 0  Delayed Recall (0/5) 3  Orientation (0/6) 6  Total 26  Adjusted Score (based on education) 26      Lab Results  Component Value Date   HGBA1C 7.6 (A) 03/13/2023      Past Medical History:  Diagnosis Date   Diabetes mellitus without complication (HCC)    GERD (gastroesophageal reflux disease)    H. pylori infection    History of stomach ulcers    Sciatica     Past Surgical History:  Procedure Laterality Date   TUBAL LIGATION       Medications:  Outpatient Encounter Medications as of 04/10/2023  Medication Sig   aspirin EC 81 MG tablet Take 1 tablet (81 mg total) by mouth daily. Swallow whole.   atorvastatin (LIPITOR) 40 MG tablet Take 1 tablet  (40 mg total) by mouth daily.   empagliflozin (JARDIANCE) 25 MG TABS tablet Take 1 tablet (25 mg total) by mouth daily.   pantoprazole (PROTONIX) 40 MG tablet Take 1 tablet (40 mg total) by mouth daily.   cetirizine (ZYRTEC) 10 MG tablet Take 1 tablet (10 mg total) by mouth daily. (Patient not taking: Reported on 04/10/2023)   EPINEPHrine 0.3 mg/0.3 mL IJ SOAJ injection Inject 0.3 mg into the muscle as needed for anaphylaxis. (Patient not taking: Reported on 10/17/2022)   EPINEPHrine 0.3 mg/0.3 mL IJ SOAJ injection Inject 0.3 mg into the muscle as needed for anaphylaxis. (Patient not taking: Reported on 10/17/2022)   No facility-administered encounter medications on file as of 04/10/2023.  Allergies:  Allergies  Allergen Reactions   Penicillins Swelling    Has patient had a PCN reaction causing immediate rash, facial/tongue/throat swelling, SOB or lightheadedness with hypotension: Yes Has patient had a PCN reaction causing severe rash involving mucus membranes or skin necrosis: No Has patient had a PCN reaction that required hospitalization: No Has patient had a PCN reaction occurring within the last 10 years: No If all of the above answers are "NO", then may proceed with Cephalosporin use.    Childhood allergy.   Almond Oil Swelling   Peanuts [Peanut Oil] Swelling    Facial swelling   Morphine And Codeine Nausea And Vomiting   Strawberry Extract Other (See Comments)    Dizziness.    Tramadol Nausea Only    Family History: Family History  Adopted: Yes  Problem Relation Age of Onset   Dementia Mother    Heart attack Mother     Social History: Social History   Tobacco Use   Smoking status: Never   Smokeless tobacco: Never  Substance Use Topics   Alcohol use: No   Drug use: No   Social History   Social History Narrative   Right Handed    Lives in apartment on the 3rd floor. One story floor plan    Vital Signs:  BP 104/67   Pulse 92   Ht 5' 3.5" (1.613 m)   Wt  188 lb (85.3 kg)   LMP 08/12/2014 (Exact Date)   SpO2 100%   BMI 32.78 kg/m    General Medical Exam:    General:  Well appearing, comfortable.   Eyes/ENT: see cranial nerve examination.   Respiratory:  No respiratory distress .   Extremities:  No deformities, edema, or skin discoloration.  Skin:  No rashes or lesions.  Neurological Exam: MENTAL STATUS including orientation to time, place, person, recent and remote memory, attention span and concentration, language, and fund of knowledge is normal.  Speech is not dysarthric.  CRANIAL NERVES: II:  No visual field defects.  Fundi not visualized III-IV-VI: Pupils equal round and reactive to light.  Normal conjugate, extra-ocular eye movements in all directions of gaze.  No nystagmus.  No ptosis .   V:  Normal facial sensation.    VII:  Normal facial symmetry and movements.   VIII:  Normal hearing and vestibular function.   IX-X:  Normal palatal movement.   XI:  Normal shoulder shrug and head rotation.   XII:  Normal tongue strength and range of motion, no deviation or fasciculation.  MOTOR:  No atrophy, fasciculations or abnormal movements.  No pronator drift.    SENSORY:  Normal and symmetric perception of light touch, pinprick, vibration, and proprioception.     COORDINATION/GAIT: Normal finger-to- nose-finger and heel-to-shin.  Intact rapid alternating movements bilaterally.  Able to rise from a chair without using arms.  Gait narrow based and stable. Tandem and stressed gait intact.     Total time spent:41 mins   Thank you for allowing me to participate in patient's care.  If I can answer any additional questions, I would be pleased to do so.    Sincerely,   Marlowe Kays, PA-C

## 2023-04-10 NOTE — Progress Notes (Signed)
B12 on the low side, it can affect memory recommend 1000 mcg daily and follow with PCP. Thyroid is normal

## 2023-04-21 ENCOUNTER — Other Ambulatory Visit: Payer: Self-pay

## 2023-05-15 ENCOUNTER — Ambulatory Visit: Payer: Medicaid Other | Attending: Family Medicine | Admitting: Family Medicine

## 2023-05-15 ENCOUNTER — Encounter: Payer: Self-pay | Admitting: Family Medicine

## 2023-05-15 ENCOUNTER — Other Ambulatory Visit (HOSPITAL_COMMUNITY)
Admission: RE | Admit: 2023-05-15 | Discharge: 2023-05-15 | Disposition: A | Discharge status: Home / Self Care | Payer: Medicaid Other | Source: Ambulatory Visit | Attending: Family Medicine | Admitting: Family Medicine

## 2023-05-15 ENCOUNTER — Other Ambulatory Visit: Payer: Self-pay

## 2023-05-15 VITALS — BP 109/71 | Temp 98.5°F | Resp 16 | Ht 63.5 in | Wt 188.0 lb

## 2023-05-15 DIAGNOSIS — Z0001 Encounter for general adult medical examination with abnormal findings: Secondary | ICD-10-CM

## 2023-05-15 DIAGNOSIS — Z124 Encounter for screening for malignant neoplasm of cervix: Secondary | ICD-10-CM | POA: Diagnosis present

## 2023-05-15 DIAGNOSIS — Z113 Encounter for screening for infections with a predominantly sexual mode of transmission: Secondary | ICD-10-CM

## 2023-05-15 DIAGNOSIS — N898 Other specified noninflammatory disorders of vagina: Secondary | ICD-10-CM | POA: Diagnosis not present

## 2023-05-15 DIAGNOSIS — Z7984 Long term (current) use of oral hypoglycemic drugs: Secondary | ICD-10-CM

## 2023-05-15 DIAGNOSIS — Z1231 Encounter for screening mammogram for malignant neoplasm of breast: Secondary | ICD-10-CM

## 2023-05-15 DIAGNOSIS — Z1211 Encounter for screening for malignant neoplasm of colon: Secondary | ICD-10-CM

## 2023-05-15 DIAGNOSIS — E1165 Type 2 diabetes mellitus with hyperglycemia: Secondary | ICD-10-CM | POA: Diagnosis not present

## 2023-05-15 MED ORDER — CLOTRIMAZOLE 1 % EX CREA
1.0000 | TOPICAL_CREAM | Freq: Two times a day (BID) | CUTANEOUS | 0 refills | Status: AC
  Filled 2023-05-15: qty 30, 15d supply, fill #0

## 2023-05-15 NOTE — Progress Notes (Signed)
Subjective:  Patient ID: Heather Cook, female    DOB: 11-20-1960  Age: 62 y.o. MRN: [id number]  CC: No chief complaint on file.   HPI Heather Cook is a 62 y.o. year old female with a history of GERD, TIA, type 2 diabetes mellitus (A1c 7.6) .  Interval History: Discussed the use of AI scribe software for clinical note transcription with the patient, who gave verbal consent to proceed.   The patient presents for an annual physical, including a Pap smear. She is also due for a mammogram and colonoscopy, which will be scheduled.  The patient reports some physical activity at work and at home, including walking and climbing stairs. However, she is moving to a location with fewer stairs. She reports some consumption of vegetables, but notes that cost is a barrier to a healthier diet. She has been trying to lose weight and thought she had been successful, but her weight has remained stable over the past month.  The patient has a history of diabetes and has not been seeing an eye doctor or dentist regularly. She also reports itching in her genital area, which she attributes to her diabetes. She denies any discharge. She has been using a cream for relief, but it is almost finished.         Past Medical History:  Diagnosis Date   Diabetes mellitus without complication (HCC)    GERD (gastroesophageal reflux disease)    H. pylori infection    History of stomach ulcers    Sciatica     Past Surgical History:  Procedure Laterality Date   TUBAL LIGATION      Family History  Adopted: Yes  Problem Relation Age of Onset   Dementia Mother    Heart attack Mother     Social History   Socioeconomic History   Marital status: Single    Spouse name: Not on file   Number of children: Not on file   Years of education: Not on file   Highest education level: Not on file  Occupational History   Not on file  Tobacco Use   Smoking status: Never   Smokeless tobacco: Never  Vaping Use   Vaping  status: Not on file  Substance and Sexual Activity   Alcohol use: No   Drug use: No   Sexual activity: Never  Other Topics Concern   Not on file  Social History Narrative   Right Handed    Lives in apartment on the 3rd floor. One story floor plan   Social Drivers of Health   Financial Resource Strain: High Risk (05/15/2023)   Overall Financial Resource Strain (CARDIA)    Difficulty of Paying Living Expenses: Very hard  Food Insecurity: Food Insecurity Present (05/15/2023)   Hunger Vital Sign    Worried About Running Out of Food in the Last Year: Sometimes true    Ran Out of Food in the Last Year: Sometimes true  Transportation Needs: No Transportation Needs (03/14/2023)   PRAPARE - Administrator, Civil Service (Medical): No    Lack of Transportation (Non-Medical): No  Physical Activity: Inactive (03/14/2023)   Exercise Vital Sign    Days of Exercise per Week: 0 days    Minutes of Exercise per Session: 0 min  Stress: No Stress Concern Present (05/15/2023)   [employer] of Occupational Health - Occupational Stress Questionnaire    Feeling of Stress : Only a little  Social Connections: Moderately Integrated (03/14/2023)   Social Connection  and Isolation Panel [NHANES]    Frequency of Communication with Friends and Family: Once a week    Frequency of Social Gatherings with Friends and Family: More than three times a week    Attends Religious Services: 1 to 4 times per year    Active Member of [employer] or Organizations: Yes    Attends Banker Meetings: 1 to 4 times per year    Marital Status: Never married    Allergies  Allergen Reactions   Penicillins Swelling    Has patient had a PCN reaction causing immediate rash, facial/tongue/throat swelling, SOB or lightheadedness with hypotension: Yes Has patient had a PCN reaction causing severe rash involving mucus membranes or skin necrosis: No Has patient had a PCN reaction that required  hospitalization: No Has patient had a PCN reaction occurring within the last 10 years: No If all of the above answers are "NO", then may proceed with Cephalosporin use.    Childhood allergy.   Almond Oil Swelling   Peanuts [Peanut Oil] Swelling    Facial swelling   Morphine And Codeine Nausea And Vomiting   Strawberry Extract Other (See Comments)    Dizziness.    Tramadol Nausea Only    Outpatient Medications Prior to Visit  Medication Sig Dispense Refill   aspirin EC 81 MG tablet Take 1 tablet (81 mg total) by mouth daily. Swallow whole. 90 tablet 1   cetirizine (ZYRTEC) 10 MG tablet Take 1 tablet (10 mg total) by mouth daily. 30 tablet 0   empagliflozin (JARDIANCE) 25 MG TABS tablet Take 1 tablet (25 mg total) by mouth daily. 90 tablet 1   EPINEPHrine 0.3 mg/0.3 mL IJ SOAJ injection Inject 0.3 mg into the muscle as needed for anaphylaxis. 2 each 1   atorvastatin (LIPITOR) 40 MG tablet Take 1 tablet (40 mg total) by mouth daily. 90 tablet 1   pantoprazole (PROTONIX) 40 MG tablet Take 1 tablet (40 mg total) by mouth daily. 30 tablet 0   EPINEPHrine 0.3 mg/0.3 mL IJ SOAJ injection Inject 0.3 mg into the muscle as needed for anaphylaxis. (Patient not taking: Reported on 10/17/2022) 2 each 1   No facility-administered medications prior to visit.     ROS Review of Systems  Constitutional:  Negative for activity change and appetite change.  HENT:  Negative for sinus pressure and sore throat.   Respiratory:  Negative for chest tightness, shortness of breath and wheezing.   Cardiovascular:  Negative for chest pain and palpitations.  Gastrointestinal:  Negative for abdominal distention, abdominal pain and constipation.  Genitourinary: Negative.   Musculoskeletal: Negative.   Psychiatric/Behavioral:  Negative for behavioral problems and dysphoric mood.     Objective:  BP 109/71 (BP Location: Right Arm, Patient Position: Sitting)   Temp 98.5 F (36.9 C) (Oral)   Resp 16   Ht 5'  3.5" (1.613 m)   Wt 188 lb (85.3 kg)   LMP 08/12/2014 (Exact Date)   SpO2 99%   BMI 32.78 kg/m      05/15/2023    3:19 PM 04/10/2023   12:49 PM 03/13/2023    3:38 PM  BP/Weight  Systolic BP 109 104 127  Diastolic BP 71 67 72  Wt. (Lbs) 188 188 188.4  BMI 32.78 kg/m2 32.78 kg/m2 33.37 kg/m2      Physical Exam Exam conducted with a chaperone present.  Constitutional:      General: She is not in acute distress.    Appearance: She is well-developed. She is  not diaphoretic.  HENT:     Head: Normocephalic.     Right Ear: External ear normal.     Left Ear: External ear normal.     Nose: Nose normal.  Eyes:     Conjunctiva/sclera: Conjunctivae normal.     Pupils: Pupils are equal, round, and reactive to light.  Neck:     Vascular: No JVD.  Cardiovascular:     Rate and Rhythm: Normal rate and regular rhythm.     Heart sounds: Normal heart sounds. No murmur heard.    No gallop.  Pulmonary:     Effort: Pulmonary effort is normal. No respiratory distress.     Breath sounds: Normal breath sounds. No wheezing or rales.  Chest:     Chest wall: No tenderness.  Breasts:    Right: Normal. No mass, nipple discharge or tenderness.     Left: Normal. No mass, nipple discharge or tenderness.  Abdominal:     General: Bowel sounds are normal. There is no distension.     Palpations: Abdomen is soft. There is no mass.     Tenderness: There is no abdominal tenderness.     Hernia: There is no hernia in the left inguinal area or right inguinal area.  Genitourinary:    General: Normal vulva.     Pubic Area: No rash.      Labia:        Right: No rash.        Left: No rash.      Vagina: Normal.     Cervix: Normal.     Uterus: Normal.      Adnexa: Right adnexa normal and left adnexa normal.       Right: No tenderness.         Left: No tenderness.    Musculoskeletal:        General: No tenderness. Normal range of motion.     Cervical back: Normal range of motion. No tenderness.   Lymphadenopathy:     Upper Body:     Right upper body: No supraclavicular or axillary adenopathy.     Left upper body: No supraclavicular or axillary adenopathy.  Skin:    General: Skin is warm and dry.  Neurological:     Mental Status: She is alert and oriented to person, place, and time.     Deep Tendon Reflexes: Reflexes are normal and symmetric.        Latest Ref Rng & Units 03/13/2023    4:25 PM 10/13/2022    6:28 PM 09/30/2022    9:41 AM  CMP  Glucose 70 - 99 mg/dL 161  096  045   BUN 8 - 27 mg/dL 9  10  7    Creatinine 0.57 - 1.00 mg/dL 4.09  8.11  9.14   Sodium 134 - 144 mmol/L 142  138  134   Potassium 3.5 - 5.2 mmol/L 4.2  3.5  3.9   Chloride 96 - 106 mmol/L 104  102  104   CO2 20 - 29 mmol/L 22  25  23    Calcium 8.7 - 10.3 mg/dL 9.6  9.6  8.7   Total Protein 6.0 - 8.5 g/dL 7.3     Total Bilirubin 0.0 - 1.2 mg/dL 0.3     Alkaline Phos 44 - 121 IU/L 105     AST 0 - 40 IU/L 20     ALT 0 - 32 IU/L 17       Lipid Panel  Component Value Date/Time   CHOL 198 09/30/2022 0941   TRIG 94 09/30/2022 0941   HDL 43 09/30/2022 0941   CHOLHDL 4.6 09/30/2022 0941   VLDL 19 09/30/2022 0941   LDLCALC 136 (H) 09/30/2022 0941    CBC    Component Value Date/Time   WBC 7.9 10/13/2022 1828   RBC 5.20 (H) 10/13/2022 1828   HGB 13.6 10/13/2022 1828   HCT 43.9 10/13/2022 1828   PLT 267 10/13/2022 1828   MCV 84.4 10/13/2022 1828   MCH 26.2 10/13/2022 1828   MCHC 31.0 10/13/2022 1828   RDW 13.3 10/13/2022 1828   LYMPHSABS 2.1 10/13/2022 1828   MONOABS 0.5 10/13/2022 1828   EOSABS 0.2 10/13/2022 1828   BASOSABS 0.0 10/13/2022 1828    Lab Results  Component Value Date   HGBA1C 7.6 (A) 03/13/2023    Assessment & Plan:      Annual Physical Exam Routine physical exam with Pap smear performed. Noted some spotting post-procedure. Limited physical activity and inconsistent intake of fruits and vegetables due to financial constraints. -Encouraged to aim for 150 minutes  of exercise per week and incorporate more fruits and vegetables into diet when possible.  Screening for breast cancer -Order mammogram    Screening for colon cancer  -colonoscopy ordered  Screening for cervical cancer -Results of Pap smear to be communicated by end of the week.   Genital Itching Reports of genital itching, no discharge. Possible yeast infection secondary to diabetes. -Prescribe antifungal cream for symptomatic relief.  Follow-up Await results of Pap smear. Follow-up after mammogram and colonoscopy have been completed.          Meds ordered this encounter  Medications   clotrimazole (LOTRIMIN) 1 % cream    Sig: Apply 1 Application topically 2 (two) times daily.    Dispense:  30 g    Refill:  0    Follow-up: Return in about 3 months (around 08/13/2023) for Chronic medical conditions.       Hoy Register, MD, FAAFP. [facility] [place], [place] [phone]   05/15/2023, 4:50 PM

## 2023-05-15 NOTE — Patient Instructions (Addendum)

## 2023-05-16 LAB — CYTOLOGY - PAP
Comment: NEGATIVE
Diagnosis: NEGATIVE
High risk HPV: NEGATIVE

## 2023-05-17 LAB — MICROALBUMIN / CREATININE URINE RATIO
Creatinine, Urine: 57.8 mg/dL
Microalb/Creat Ratio: 7 mg/g{creat} (ref 0–29)
Microalbumin, Urine: 4 ug/mL

## 2023-05-23 ENCOUNTER — Other Ambulatory Visit: Payer: Self-pay

## 2023-05-25 ENCOUNTER — Ambulatory Visit (HOSPITAL_COMMUNITY)
Admission: EM | Admit: 2023-05-25 | Discharge: 2023-05-25 | Disposition: A | Discharge status: Home / Self Care | Payer: Medicaid Other | Attending: Internal Medicine | Admitting: Internal Medicine

## 2023-05-25 ENCOUNTER — Other Ambulatory Visit: Payer: Self-pay

## 2023-05-25 ENCOUNTER — Encounter (HOSPITAL_COMMUNITY): Payer: Self-pay | Admitting: Emergency Medicine

## 2023-05-25 DIAGNOSIS — J069 Acute upper respiratory infection, unspecified: Secondary | ICD-10-CM | POA: Diagnosis not present

## 2023-05-25 DIAGNOSIS — U071 COVID-19: Secondary | ICD-10-CM

## 2023-05-25 HISTORY — DX: Cerebral infarction, unspecified: I63.9

## 2023-05-25 LAB — POC COVID19/FLU A&B COMBO
Covid Antigen, POC: POSITIVE — AB
Influenza A Antigen, POC: NEGATIVE
Influenza B Antigen, POC: NEGATIVE

## 2023-05-25 MED ORDER — BENZONATATE 100 MG PO CAPS
200.0000 mg | ORAL_CAPSULE | Freq: Three times a day (TID) | ORAL | 0 refills | Status: DC | PRN
  Filled 2023-05-25: qty 30, 5d supply, fill #0

## 2023-05-25 MED ORDER — PAXLOVID (300/100) 20 X 150 MG & 10 X 100MG PO TBPK
3.0000 | ORAL_TABLET | Freq: Two times a day (BID) | ORAL | 0 refills | Status: AC
  Filled 2023-05-25: qty 30, 5d supply, fill #0

## 2023-05-25 MED ORDER — GUAIFENESIN ER 600 MG PO TB12
600.0000 mg | ORAL_TABLET | Freq: Two times a day (BID) | ORAL | 0 refills | Status: DC
  Filled 2023-05-25: qty 20, 10d supply, fill #0

## 2023-05-25 NOTE — Discharge Instructions (Addendum)
Please maintain adequate hydration Please take medications as directed If you have worsening symptoms please return to urgent care to be reevaluated COVID test is positive.

## 2023-05-25 NOTE — ED Triage Notes (Addendum)
Pt c/o cough, SOB, mucous, and chest discomfort for 2 days. States she feels her heart was fluttering last night

## 2023-05-25 NOTE — ED Provider Notes (Addendum)
[facility]    CSN: [id number] Arrival date & time: 05/25/23  1331      History   Chief Complaint Chief Complaint  Patient presents with   Cough   Shortness of Breath    HPI Heather Cook is a 62 y.o. female comes to the urgent care with a 2-day history of cough, shortness of breath and chest discomfort.  Patient's symptoms started insidiously and has been persistent.  No fever or chills.  No postnasal drainage.  No chest pain.  Patient is not a smoker.  No dizziness, near-syncope or syncopal episodes.  She endorses some sore throat.  No wheezing.  No dizziness, near-syncope or syncopal episodes.  No sick contacts.Marland Kitchen   HPI  Past Medical History:  Diagnosis Date   Diabetes mellitus without complication (HCC)    GERD (gastroesophageal reflux disease)    H. pylori infection    History of stomach ulcers    Sciatica    Stroke [facility])     Patient Active Problem List   Diagnosis Date Noted   Memory impairment 04/10/2023   Stroke-like symptoms 10/07/2022   TIA (transient ischemic attack) 09/29/2022   Acute CVA (cerebrovascular accident) (HCC) 09/29/2022   Controlled type 2 diabetes mellitus without complication, without long-term current use of insulin (HCC) 09/29/2022   GERD (gastroesophageal reflux disease) 09/29/2022    Past Surgical History:  Procedure Laterality Date   TUBAL LIGATION      OB History     Gravida  3   Para  2   Term  2   Preterm      AB  1   Living         SAB  1   IAB      Ectopic      Multiple      Live Births               Home Medications    Prior to Admission medications   Medication Sig Start Date End Date Taking? Authorizing Provider  benzonatate (TESSALON) 100 MG capsule Take 2 capsules (200 mg total) by mouth 3 (three) times daily as needed for cough. 05/25/23  Yes Nathaneil Feagans, Britta Mccreedy, MD  guaiFENesin (MUCINEX) 600 MG 12 hr tablet Take 1 tablet (600 mg total) by mouth 2 (two) times daily. 05/25/23  Yes  Larone Kliethermes, Britta Mccreedy, MD  nirmatrelvir/ritonavir (PAXLOVID, 300/100,) 20 x 150 MG & 10 x 100MG  TBPK Take 3 tablets by mouth 2 (two) times daily for 5 days. Patient GFR is 60. Take nirmatrelvir (150 mg) two tablets twice daily for 5 days and ritonavir (100 mg) one tablet twice daily for 5 days. 05/25/23 05/30/23 Yes Milliani Herrada, Britta Mccreedy, MD  aspirin EC 81 MG tablet Take 1 tablet (81 mg total) by mouth daily. Swallow whole. 03/13/23   Hoy Register, MD  atorvastatin (LIPITOR) 40 MG tablet Take 1 tablet (40 mg total) by mouth daily. 03/13/23   Hoy Register, MD  cetirizine (ZYRTEC) 10 MG tablet Take 1 tablet (10 mg total) by mouth daily. 01/06/23   Hoy Register, MD  clotrimazole (LOTRIMIN) 1 % cream Apply 1 Application topically 2 (two) times daily. 05/15/23   Hoy Register, MD  empagliflozin (JARDIANCE) 25 MG TABS tablet Take 1 tablet (25 mg total) by mouth daily. 03/13/23   Hoy Register, MD  EPINEPHrine 0.3 mg/0.3 mL IJ SOAJ injection Inject 0.3 mg into the muscle as needed for anaphylaxis. 10/13/22   Anders Simmonds, PA-C  pantoprazole (PROTONIX) 40  MG tablet Take 1 tablet (40 mg total) by mouth daily. 01/06/23   Hoy Register, MD    Family History Family History  Adopted: Yes  Problem Relation Age of Onset   Dementia Mother    Heart attack Mother     Social History Social History   Tobacco Use   Smoking status: Never   Smokeless tobacco: Never  Substance Use Topics   Alcohol use: No   Drug use: No     Allergies   Penicillins, Almond oil, Peanuts [peanut oil], Morphine and codeine, Strawberry extract, and Tramadol   Review of Systems Review of Systems As per HPI  Physical Exam Triage Vital Signs ED Triage Vitals  Encounter Vitals Group     BP 05/25/23 1342 113/72     Systolic BP Percentile --      Diastolic BP Percentile --      Pulse Rate 05/25/23 1342 91     Resp 05/25/23 1342 20     Temp 05/25/23 1342 99.4 F (37.4 C)     Temp Source 05/25/23 1342 Oral      SpO2 05/25/23 1342 97 %     Weight --      Height --      Head Circumference --      Peak Flow --      Pain Score 05/25/23 1339 0     Pain Loc --      Pain Education --      Exclude from Growth Chart --    No data found.  Updated Vital Signs BP 113/72   Pulse 91   Temp 99.4 F (37.4 C) (Oral)   Resp 20   LMP 08/12/2014 (Exact Date)   SpO2 97%   Visual Acuity Right Eye Distance:   Left Eye Distance:   Bilateral Distance:    Right Eye Near:   Left Eye Near:    Bilateral Near:     Physical Exam Vitals and nursing note reviewed.  Constitutional:      General: She is not in acute distress.    Appearance: She is ill-appearing.  Cardiovascular:     Rate and Rhythm: Normal rate and regular rhythm.  Pulmonary:     Effort: Pulmonary effort is normal. No tachypnea, accessory muscle usage or respiratory distress.     Breath sounds: Normal breath sounds. No decreased breath sounds, wheezing, rhonchi or rales.  Abdominal:     Palpations: Abdomen is soft.  Musculoskeletal:     Cervical back: Normal range of motion.  Neurological:     Mental Status: She is alert.      UC Treatments / Results  Labs (all labs ordered are listed, but only abnormal results are displayed) Labs Reviewed  POC COVID19/FLU A&B COMBO - Abnormal; Notable for the following components:      Result Value   Covid Antigen, POC Positive (*)    All other components within normal limits    EKG   Radiology No results found.  Procedures Procedures (including critical care time)  Medications Ordered in UC Medications - No data to display  Initial Impression / Assessment and Plan / UC Course  I have reviewed the triage vital signs and the nursing notes.  Pertinent labs & imaging results that were available during my care of the patient were reviewed by me and considered in my medical decision making (see chart for details).     1.  Viral URI with cough: COVID is positive Paxlovid for 5  days Tessalon Perles 200 mg every 8 hours as needed for cough Mucinex 100 mg twice daily as needed for cough Adequate hydration recommended Return precautions given. Final Clinical Impressions(s) / UC Diagnoses   Final diagnoses:  COVID-19 virus infection     Discharge Instructions      Please maintain adequate hydration Please take medications as directed If you have worsening symptoms please return to urgent care to be reevaluated COVID test is positive.    ED Prescriptions     Medication Sig Dispense Auth. Provider   benzonatate (TESSALON) 100 MG capsule Take 2 capsules (200 mg total) by mouth 3 (three) times daily as needed for cough. 30 capsule Nashayla Telleria, Britta Mccreedy, MD   guaiFENesin (MUCINEX) 600 MG 12 hr tablet Take 1 tablet (600 mg total) by mouth 2 (two) times daily. 20 tablet Angie Piercey, Britta Mccreedy, MD   nirmatrelvir/ritonavir (PAXLOVID, 300/100,) 20 x 150 MG & 10 x 100MG  TBPK Take 3 tablets by mouth 2 (two) times daily for 5 days. Patient GFR is 60. Take nirmatrelvir (150 mg) two tablets twice daily for 5 days and ritonavir (100 mg) one tablet twice daily for 5 days. 30 tablet Satish Hammers, Britta Mccreedy, MD      PDMP not reviewed this encounter.   Merrilee Jansky, MD 05/25/23 1516    Merrilee Jansky, MD 05/25/23 [phone]

## 2023-06-21 ENCOUNTER — Other Ambulatory Visit: Payer: Self-pay

## 2023-06-21 ENCOUNTER — Ambulatory Visit (AMBULATORY_SURGERY_CENTER): Payer: Medicaid Other

## 2023-06-21 VITALS — Ht 63.5 in | Wt 188.0 lb

## 2023-06-21 DIAGNOSIS — Z1211 Encounter for screening for malignant neoplasm of colon: Secondary | ICD-10-CM

## 2023-06-21 MED ORDER — PEG 3350-KCL-NA BICARB-NACL 420 G PO SOLR
4000.0000 mL | Freq: Once | ORAL | 0 refills | Status: AC
  Filled 2023-06-21: qty 4000, 1d supply, fill #0

## 2023-06-21 NOTE — Progress Notes (Signed)
No egg or soy allergy known to patient  No issues known to pt with past sedation with any surgeries or procedures Patient denies ever being told they had issues or difficulty with intubation  No FH of Malignant Hyperthermia Pt is not on diet pills Pt is not on  home 02  Pt is not on blood thinners  Pt denies issues with constipation  No A fib or A flutter Have any cardiac testing pending--no LOA: independent  Prep: Golytely   Patient's chart reviewed by Cathlyn Parsons CNRA prior to previsit and patient appropriate for the [facility].  Previsit completed and red dot placed by patient's name on their procedure day (on provider's schedule).     PV completed with patient. Prep instructions sent via mychart.

## 2023-06-23 ENCOUNTER — Other Ambulatory Visit: Payer: Self-pay

## 2023-06-23 ENCOUNTER — Encounter (HOSPITAL_COMMUNITY): Payer: Self-pay | Admitting: *Deleted

## 2023-06-23 ENCOUNTER — Ambulatory Visit (HOSPITAL_COMMUNITY)
Admission: EM | Admit: 2023-06-23 | Discharge: 2023-06-23 | Disposition: A | Discharge status: Home / Self Care | Payer: Medicaid Other | Attending: Internal Medicine | Admitting: Internal Medicine

## 2023-06-23 ENCOUNTER — Telehealth (HOSPITAL_COMMUNITY): Payer: Self-pay | Admitting: *Deleted

## 2023-06-23 DIAGNOSIS — S39012A Strain of muscle, fascia and tendon of lower back, initial encounter: Secondary | ICD-10-CM

## 2023-06-23 MED ORDER — BACLOFEN 10 MG PO TABS: 10.0000 mg | ORAL_TABLET | Freq: Three times a day (TID) | ORAL | 0 refills | Status: AC

## 2023-06-23 MED ORDER — BACLOFEN 10 MG PO TABS
10.0000 mg | ORAL_TABLET | Freq: Three times a day (TID) | ORAL | 0 refills | Status: DC
  Filled 2023-06-23: qty 30, 10d supply, fill #0

## 2023-06-23 NOTE — Discharge Instructions (Signed)
Your pain is likely due to a muscle strain which will improve on its own with time.   - You may take over the counter medicines to help with pain.  - You may also take the prescribed muscle relaxer as directed as needed for muscle aches/spasm.  Do not take this medication and drive or drink alcohol as it can make you sleepy.  Mainly use this medicine at nighttime as needed. - Apply heat 20 minutes on then 20 minutes off and perform gentle range of motion exercises to the area of greatest pain to prevent muscle stiffness and provide further pain relief.   Red flag symptoms to watch out for are numbness/tingling to the legs, weakness, loss of bowel/bladder control, and/or worsening pain that does not respond well to medicines. Follow-up with your primary care provider or return to urgent care if your symptoms do not improve in the next 3 to 4 days with medications and interventions recommended today. If your symptoms are severe (red flag), please go to the emergency room.

## 2023-06-23 NOTE — ED Provider Notes (Signed)
[facility]    CSN: [id number] Arrival date & time: 06/23/23  1443      History   Chief Complaint Chief Complaint  Patient presents with   Back Pain    HPI Heather Cook is a 63 y.o. female.   Patient presents to urgent care for evaluation of pain to the bilateral lower back that started after she picked up a heavy container full of clothing this morning.  Pain started immediately after picking up a heavy container by either side of the box.  Pain is localized to the bilateral lower back and does not radiate to the legs or abdomen. No fall, trauma, numbness or tingling, saddle paresthesia, changes to bowel or urinary habits, extremity weakness, radicular symptoms.  Movement makes pain worse, rest makes pain better.  She tried putting ice to the low back which did not help very much.  She has also had 1 pill of Tylenol which also did not provide much relief.   Back Pain   Past Medical History:  Diagnosis Date   Diabetes mellitus without complication (HCC)    GERD (gastroesophageal reflux disease)    H. pylori infection    History of stomach ulcers    Sciatica    Stroke [facility])     Patient Active Problem List   Diagnosis Date Noted   Memory impairment 04/10/2023   Stroke-like symptoms 10/07/2022   TIA (transient ischemic attack) 09/29/2022   Acute CVA (cerebrovascular accident) (HCC) 09/29/2022   Controlled type 2 diabetes mellitus without complication, without long-term current use of insulin (HCC) 09/29/2022   GERD (gastroesophageal reflux disease) 09/29/2022    Past Surgical History:  Procedure Laterality Date   TUBAL LIGATION      OB History     Gravida  3   Para  2   Term  2   Preterm      AB  1   Living         SAB  1   IAB      Ectopic      Multiple      Live Births               Home Medications    Prior to Admission medications   Medication Sig Start Date End Date Taking? Authorizing Provider  aspirin EC 81 MG tablet  Take 1 tablet (81 mg total) by mouth daily. Swallow whole. 03/13/23  Yes Hoy Register, MD  atorvastatin (LIPITOR) 40 MG tablet Take 1 tablet (40 mg total) by mouth daily. 03/13/23  Yes Hoy Register, MD  cetirizine (ZYRTEC) 10 MG tablet Take 1 tablet (10 mg total) by mouth daily. 01/06/23  Yes Hoy Register, MD  clotrimazole (LOTRIMIN) 1 % cream Apply 1 Application topically 2 (two) times daily. 05/15/23  Yes Hoy Register, MD  empagliflozin (JARDIANCE) 25 MG TABS tablet Take 1 tablet (25 mg total) by mouth daily. 03/13/23  Yes Newlin, Odette Horns, MD  pantoprazole (PROTONIX) 40 MG tablet Take 1 tablet (40 mg total) by mouth daily. 01/06/23  Yes Hoy Register, MD  vitamin B-12 (CYANOCOBALAMIN) 50 MCG tablet Take 50 mcg by mouth daily.   Yes [provider]  baclofen (LIORESAL) 10 MG tablet Take 1 tablet (10 mg total) by mouth 3 (three) times daily. 06/23/23   Carlisle Beers, FNP  EPINEPHrine 0.3 mg/0.3 mL IJ SOAJ injection Inject 0.3 mg into the muscle as needed for anaphylaxis. 10/13/22   Anders Simmonds, PA-C  polyethylene glycol-electrolytes (NULYTELY) 420  g solution Take 4,000 mLs by mouth once for 1 dose. 06/21/23 06/24/23  Imogene Burn, MD    Family History Family History  Adopted: Yes  Problem Relation Age of Onset   Dementia Mother    Heart attack Mother    Stomach cancer Sister 50 - 62    Social History Social History   Tobacco Use   Smoking status: Never   Smokeless tobacco: Never  Substance Use Topics   Alcohol use: No   Drug use: No     Allergies   Penicillins, Almond oil, Peanuts [peanut oil], Morphine, Morphine and codeine, Strawberry extract, and Tramadol   Review of Systems Review of Systems  Musculoskeletal:  Positive for back pain.  Per HPI   Physical Exam Triage Vital Signs ED Triage Vitals  Encounter Vitals Group     BP 06/23/23 1540 (!) 122/55     Systolic BP Percentile --      Diastolic BP Percentile --      Pulse Rate  06/23/23 1540 92     Resp 06/23/23 1540 18     Temp 06/23/23 1540 98.2 F (36.8 C)     Temp Source 06/23/23 1540 Oral     SpO2 06/23/23 1540 98 %     Weight --      Height --      Head Circumference --      Peak Flow --      Pain Score 06/23/23 1539 10     Pain Loc --      Pain Education --      Exclude from Growth Chart --    No data found.  Updated Vital Signs BP (!) 122/55 (BP Location: Right Arm)   Pulse 92   Temp 98.2 F (36.8 C) (Oral)   Resp 18   LMP 08/12/2014 (Exact Date)   SpO2 98%   Visual Acuity Right Eye Distance:   Left Eye Distance:   Bilateral Distance:    Right Eye Near:   Left Eye Near:    Bilateral Near:     Physical Exam Vitals and nursing note reviewed.  Constitutional:      Appearance: She is not ill-appearing or toxic-appearing.  HENT:     Head: Normocephalic and atraumatic.     Right Ear: Hearing and external ear normal.     Left Ear: Hearing and external ear normal.     Nose: Nose normal.     Mouth/Throat:     Lips: Pink.  Eyes:     General: Lids are normal. Vision grossly intact. Gaze aligned appropriately.     Extraocular Movements: Extraocular movements intact.     Conjunctiva/sclera: Conjunctivae normal.  Cardiovascular:     Rate and Rhythm: Normal rate and regular rhythm.     Heart sounds: Normal heart sounds, S1 normal and S2 normal.  Pulmonary:     Effort: Pulmonary effort is normal. No respiratory distress.     Breath sounds: Normal breath sounds and air entry.  Musculoskeletal:     Cervical back: Normal and neck supple.     Thoracic back: Normal.     Lumbar back: Spasms and tenderness (TTP to the bilateral lumbar paraspinous muscles and slight tenderness to the midline lumbar spine without step-off or crepitus.) present. No swelling, edema, deformity, signs of trauma, lacerations or bony tenderness. Decreased range of motion (Decreased range of motion at the lumbar spine secondary to pain). Negative right straight leg raise  test and negative left straight  leg raise test. No scoliosis.  Skin:    General: Skin is warm and dry.     Capillary Refill: Capillary refill takes less than 2 seconds.     Findings: No rash.  Neurological:     General: No focal deficit present.     Mental Status: She is alert and oriented to person, place, and time. Mental status is at baseline.     Cranial Nerves: No dysarthria or facial asymmetry.  Psychiatric:        Mood and Affect: Mood normal.        Speech: Speech normal.        Behavior: Behavior normal.        Thought Content: Thought content normal.        Judgment: Judgment normal.      UC Treatments / Results  Labs (all labs ordered are listed, but only abnormal results are displayed) Labs Reviewed - No data to display  EKG   Radiology No results found.  Procedures Procedures (including critical care time)  Medications Ordered in UC Medications - No data to display  Initial Impression / Assessment and Plan / UC Course  I have reviewed the triage vital signs and the nursing notes.  Pertinent labs & imaging results that were available during my care of the patient were reviewed by me and considered in my medical decision making (see chart for details).   1.  Strain of lumbar spine Evaluation suggests pain is musculoskeletal in nature. Will manage this with rest, gentle ROM exercises, heat therapy, Tylenol as needed for pain, and as needed use of muscle relaxer. Drowsiness precautions discussed regarding muscle relaxer use. Imaging: no indication for imaging based on stable musculoskeletal exam findings May follow-up with orthopedics as needed.  Counseled patient on potential for adverse effects with medications prescribed/recommended today, strict ER and return-to-clinic precautions discussed, patient verbalized understanding.    Final Clinical Impressions(s) / UC Diagnoses   Final diagnoses:  Strain of lumbar region, initial encounter     Discharge  Instructions      Your pain is likely due to a muscle strain which will improve on its own with time.   - You may take over the counter medicines to help with pain.  - You may also take the prescribed muscle relaxer as directed as needed for muscle aches/spasm.  Do not take this medication and drive or drink alcohol as it can make you sleepy.  Mainly use this medicine at nighttime as needed. - Apply heat 20 minutes on then 20 minutes off and perform gentle range of motion exercises to the area of greatest pain to prevent muscle stiffness and provide further pain relief.   Red flag symptoms to watch out for are numbness/tingling to the legs, weakness, loss of bowel/bladder control, and/or worsening pain that does not respond well to medicines. Follow-up with your primary care provider or return to urgent care if your symptoms do not improve in the next 3 to 4 days with medications and interventions recommended today. If your symptoms are severe (red flag), please go to the emergency room.       ED Prescriptions     Medication Sig Dispense Auth. Provider   baclofen (LIORESAL) 10 MG tablet Take 1 tablet (10 mg total) by mouth 3 (three) times daily. 30 each Carlisle Beers, FNP      PDMP not reviewed this encounter.   Carlisle Beers, [place] 06/23/23 [phone]

## 2023-06-23 NOTE — ED Triage Notes (Signed)
Pt states that she has back pain after picking up something heavy this morning. She took one tylenol and iced ice on the area but no relief.

## 2023-07-03 ENCOUNTER — Telehealth: Payer: Self-pay | Admitting: Internal Medicine

## 2023-07-03 ENCOUNTER — Encounter: Payer: Medicaid Other | Admitting: Internal Medicine

## 2023-07-03 DIAGNOSIS — Z1211 Encounter for screening for malignant neoplasm of colon: Secondary | ICD-10-CM

## 2023-07-03 NOTE — Telephone Encounter (Signed)
Returned the patient's  phone call. She was rescheduled for 08/15/23. Will send new prep instructions to patient.

## 2023-07-03 NOTE — Telephone Encounter (Signed)
Patient called and stated that she is needing to reschedule her colonoscopy procedure. Patient was not aware she needed to take the day off of work and was not aware how long the procedure would be. Patient stated she is needing the nurse to explain how the prep medication works as well. Patient is requesting a call back. Please advise.

## 2023-08-14 ENCOUNTER — Other Ambulatory Visit: Payer: Self-pay

## 2023-08-14 ENCOUNTER — Telehealth: Payer: Self-pay | Admitting: Internal Medicine

## 2023-08-14 NOTE — Telephone Encounter (Signed)
 PT called about prep medication and instructions. She feels that what she picked up is not the same as what was prescribed to her and she would like to gain clarity on that. She would also like to have new instructions added to mychart. She would really like to have them emailed to her because she cannot access mychart due to her phone being broke. She also stated she does not have an address to send it to at the moment either. Please advise.

## 2023-08-14 NOTE — Telephone Encounter (Signed)
 Attempting to return patient's call.  Going straight to VM.  Left VM advising I would call back in 15 minutes.   Attempted two additional times.  Straight to VM.  Requested patient call back and ask to [facility] nurse for further assistance.

## 2023-08-15 ENCOUNTER — Encounter: Payer: Medicaid Other | Admitting: Internal Medicine

## 2023-08-21 ENCOUNTER — Ambulatory Visit: Payer: Medicaid Other | Attending: Family Medicine | Admitting: Family Medicine

## 2023-08-21 ENCOUNTER — Telehealth: Payer: Self-pay

## 2023-08-21 ENCOUNTER — Other Ambulatory Visit: Payer: Self-pay

## 2023-08-21 ENCOUNTER — Encounter: Payer: Self-pay | Admitting: Family Medicine

## 2023-08-21 VITALS — BP 125/75 | HR 81 | Ht 63.5 in | Wt 190.8 lb

## 2023-08-21 DIAGNOSIS — F419 Anxiety disorder, unspecified: Secondary | ICD-10-CM

## 2023-08-21 DIAGNOSIS — Z8673 Personal history of transient ischemic attack (TIA), and cerebral infarction without residual deficits: Secondary | ICD-10-CM

## 2023-08-21 DIAGNOSIS — E119 Type 2 diabetes mellitus without complications: Secondary | ICD-10-CM | POA: Diagnosis not present

## 2023-08-21 DIAGNOSIS — Z7984 Long term (current) use of oral hypoglycemic drugs: Secondary | ICD-10-CM

## 2023-08-21 DIAGNOSIS — F32A Depression, unspecified: Secondary | ICD-10-CM

## 2023-08-21 DIAGNOSIS — E1165 Type 2 diabetes mellitus with hyperglycemia: Secondary | ICD-10-CM

## 2023-08-21 LAB — POCT GLYCOSYLATED HEMOGLOBIN (HGB A1C): HbA1c, POC (controlled diabetic range): 7.8 % — AB (ref 0.0–7.0)

## 2023-08-21 MED ORDER — EMPAGLIFLOZIN 25 MG PO TABS
25.0000 mg | ORAL_TABLET | Freq: Every day | ORAL | 1 refills | Status: DC
  Filled 2023-08-21 – 2023-09-06 (×2): qty 90, 90d supply, fill #0

## 2023-08-21 MED ORDER — ATORVASTATIN CALCIUM 40 MG PO TABS
40.0000 mg | ORAL_TABLET | Freq: Every day | ORAL | 1 refills | Status: DC
  Filled 2023-08-21 – 2023-09-06 (×2): qty 90, 90d supply, fill #0
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
## 2023-08-21 NOTE — Telephone Encounter (Signed)
 At the request of Dr Alvis Lemmings, I met with the patient when she was in the clinic today. She explained that she has been trying to secure permanent housing since 04/2023 and has been running into issues with the landlord getting the home ready for her to move in.  She said there was a problem with the gas but that has been resolved but there are 2 other issues that the landlord needs to address.  She has not signed a lease yet, but has paid a deposit. I offered to make a referral to [employer] [facility] [place] for assistance with working with the landlord but she did not want to pursue that with the hope that the issues with the home would be corrected shortly.  She is staying with friends from her church until [place], 3/ 27/2025 and she has been staying in motels prior to that but it became very expensive.  I asked her if she was interested in other housing resources but she said she does not think she will be eligible for other housing because of her credit.  She is really hoping to get this house because she will have room for her 5 grandchildren when they come to stay with her. She said this is not public housing.  She spoke about being under a lot of stress but did not elaborate on the specific issues.  She was in agreement to placing a VBCI referral for assistance/counseling.

## 2023-08-21 NOTE — Patient Instructions (Signed)
 VISIT SUMMARY:  During today's visit, we discussed several important health concerns including emotional distress, diabetes management, and general health maintenance. You have been experiencing significant stress due to housing instability, bereavement, and work-related challenges. We also addressed your concerns about weight gain and its impact on your diabetes. Additionally, we reviewed your general health maintenance needs, including overdue screenings.  YOUR PLAN:  -ANXIETY AND DEPRESSION: Depression is a mental health condition characterized by persistent feelings of sadness and loss of interest. Your depression is linked to recent stressful life events. We discussed the option of counseling and prescribed hydroxyzine to help manage your anxiety. Please be cautious as it may cause drowsiness.  -DIABETES MELLITUS: Diabetes Mellitus is a condition where your blood sugar levels are higher than normal. Your A1c has increased to 7.8%, indicating that your diabetes is not well-controlled. We encourage you to adhere to your current medication, Jardiance, and we will discuss lifestyle changes once your living situation stabilizes.  -GENERAL HEALTH MAINTENANCE: General health maintenance includes routine screenings and tests to ensure overall well-being. We have ordered a mammogram and will reschedule your colonoscopy. Please follow the preparation instructions provided.  INSTRUCTIONS:  Please follow up with the counseling resources provided and coordinate with your case manager for housing assistance. Continue taking hydroxyzine as needed for anxiety, but be aware of potential drowsiness. Adhere to your diabetes medication, Jardiance, and we will revisit lifestyle changes once your living situation is stable. Ensure you complete the mammogram and follow the preparation instructions for your rescheduled colonoscopy.

## 2023-08-22 LAB — CMP14+EGFR
ALT: 14 IU/L (ref 0–32)
AST: 14 IU/L (ref 0–40)
Albumin: 4.6 g/dL (ref 3.9–4.9)
Alkaline Phosphatase: 105 IU/L (ref 44–121)
BUN/Creatinine Ratio: 13 (ref 12–28)
BUN: 10 mg/dL (ref 8–27)
Bilirubin Total: 0.3 mg/dL (ref 0.0–1.2)
CO2: 24 mmol/L (ref 20–29)
Calcium: 10.1 mg/dL (ref 8.7–10.3)
Chloride: 101 mmol/L (ref 96–106)
Creatinine, Ser: 0.77 mg/dL (ref 0.57–1.00)
Globulin, Total: 3.4 g/dL (ref 1.5–4.5)
Glucose: 145 mg/dL — ABNORMAL HIGH (ref 70–99)
Potassium: 4.9 mmol/L (ref 3.5–5.2)
Sodium: 141 mmol/L (ref 134–144)
Total Protein: 8 g/dL (ref 6.0–8.5)
eGFR: 87 mL/min/{1.73_m2} (ref >59)

## 2023-08-22 LAB — LP+NON-HDL CHOLESTEROL
Cholesterol, Total: 225 mg/dL — ABNORMAL HIGH (ref 100–199)
HDL: 57 mg/dL (ref >39)
LDL Chol Calc (NIH): 149 mg/dL — ABNORMAL HIGH (ref 0–99)
Total Non-HDL-Chol (LDL+VLDL): 168 mg/dL — ABNORMAL HIGH (ref 0–129)
Triglycerides: 107 mg/dL (ref 0–149)
VLDL Cholesterol Cal: 19 mg/dL (ref 5–40)

## 2023-08-23 ENCOUNTER — Telehealth: Payer: Self-pay | Admitting: *Deleted

## 2023-08-23 ENCOUNTER — Encounter: Payer: Self-pay | Admitting: Family Medicine

## 2023-08-23 NOTE — Progress Notes (Signed)
 Complex Care Management Note Care Guide Note  08/23/2023 Name: Heather Cook MRN: [id number] DOB: 07-07-1960   Complex Care Management Outreach Attempts: An unsuccessful telephone outreach was attempted today to offer the patient information about available complex care management services.  Follow Up Plan:  Additional outreach attempts will be made to offer the patient complex care management information and services.   Encounter Outcome:  No Answer  Burman Nieves, CMA, Care Guide [facility]  [facility], [facility] Guide Direct Dial: [phone]  Fax: [phone] Website: [email or web address]

## 2023-08-24 NOTE — Progress Notes (Signed)
 Complex Care Management Note Care Guide Note  08/24/2023 Name: Heather Cook MRN: [id number] DOB: December 25, 1960   Complex Care Management Outreach Attempts: A second unsuccessful outreach was attempted today to offer the patient with information about available complex care management services.  Follow Up Plan:  Additional outreach attempts will be made to offer the patient complex care management information and services.   Encounter Outcome:  Patient Request to Call Back  Burman Nieves, CMA [place]  [facility], [facility] Guide Direct Dial: [phone]  Fax: [phone] Website: [email or web address]

## 2023-08-28 NOTE — Progress Notes (Signed)
 Complex Care Management Note  Care Guide Note 08/28/2023 Name: Blimy Napoleon MRN: [id number] DOB: 25-Dec-1960  Neomia Herbel is a 63 y.o. year old female who sees Hoy Register, MD for primary care. I reached out to Khushboo Chuck by phone today to offer complex care management services.  Ms. Jimenez was given information about Complex Care Management services today including:   The Complex Care Management services include support from the care team which includes your Nurse Care Manager, Clinical Social Worker, or Pharmacist.  The Complex Care Management team is here to help remove barriers to the health concerns and goals most important to you. Complex Care Management services are voluntary, and the patient may decline or stop services at any time by request to their care team member.   Complex Care Management Consent Status: Patient agreed to services and verbal consent obtained.   Follow up plan:  Telephone appointment with complex care management team member scheduled for:  09/06/2023  Encounter Outcome:  Patient Scheduled  Burman Nieves, CMA [place]  [facility], [facility] Guide Direct Dial: [phone]  Fax: [phone] Website: [email or web address]

## 2023-08-31 ENCOUNTER — Other Ambulatory Visit: Payer: Self-pay

## 2023-09-06 ENCOUNTER — Other Ambulatory Visit: Payer: Self-pay

## 2023-09-06 ENCOUNTER — Ambulatory Visit: Payer: Self-pay | Admitting: Licensed Clinical Social Worker

## 2023-09-06 NOTE — Patient Outreach (Signed)
 Complex Care Management   Visit Note  09/06/2023  Name:  Heather Cook MRN: [id number] DOB: 1960/07/16  Situation: Referral received for Complex Care Management related to  housing needs of client, counseling needs of client  I obtained verbal consent from client.  Visit completed with client  on the phone  Background:   Past Medical History:  Diagnosis Date   Diabetes mellitus without complication (HCC)    GERD (gastroesophageal reflux disease)    H. pylori infection    History of stomach ulcers    Sciatica    [facility]roke [facility])     Assessment: Patient Reported Symptoms:  Cognitive  Some memory impairment  Neurological  Hx TIA; hx of CVA    HEENT    No problems noted  Cardiovascular  No problems mentioned by client    Respiratory  No problems noted    Endocrine    Unable to assess  Gastrointestinal    GERD  Genitourinary    Unable to assess  Integumentary    Unable to assess  Musculoskeletal    No movement issues noted. Client said she is walking well  Psychosocial  Stressor experienced with housing, food, finances. Job stress     There were no vitals filed for this visit.  Client did not mention any BP issues. She is 5'3" and weighs 190 pounds  Medications Reviewed Today     Reviewed by Michiel Cowboy (Social Worker) on 09/06/23 at [phone]  Med List Status: <None>   Medication Order Taking? Sig Documenting Provider Last Dose Status Informant  aspirin EC 81 MG tablet [id number] No Take 1 tablet (81 mg total) by mouth daily. Swallow whole. Hoy Register, MD Taking Active   atorvastatin (LIPITOR) 40 MG tablet [id number]  Take 1 tablet (40 mg total) by mouth daily. Hoy Register, MD  Active   baclofen (LIORESAL) 10 MG tablet [id number] No Take 1 tablet (10 mg total) by mouth 3 (three) times daily. Carlisle Beers, FNP Taking Active   cetirizine (ZYRTEC) 10 MG tablet [id number] No Take 1 tablet (10 mg total) by mouth daily. Hoy Register, MD Taking Active    clotrimazole (LOTRIMIN) 1 % cream [id number] No Apply 1 Application topically 2 (two) times daily. Hoy Register, MD Taking Active   empagliflozin (JARDIANCE) 25 MG TABS tablet [id number]  Take 1 tablet (25 mg total) by mouth daily. Hoy Register, MD  Active   EPINEPHrine 0.3 mg/0.3 mL IJ SOAJ injection [id number] No Inject 0.3 mg into the muscle as needed for anaphylaxis. Anders Simmonds, PA-C Taking Active   vitamin B-12 (CYANOCOBALAMIN) 50 MCG tablet [id number] No Take 50 mcg by mouth daily. [provider] Taking Active             Recommendation:   Client to attend scheduled medical appointments Client to communicate with landlord of home where she hopes to live to inquire about repairs and inspections dates Client to consider counseling support services in area. LCSW provided her names of 2 counseling agencies in area for her to consider Client to take medications as prescribed Client to practice self care with adequate rest, scheduled meals, taking meds as prescribed Client to speak via phone with her sister for support  Follow Up Plan:   Telephone follow-up 09/27/23 at 10:00 AM   Lorna Few  MSW, LCSW [place]/Value Based Care [facility] Licensed Clinical Social Worker Direct Dial:  [phone] Fax:  [phone] Website:  [email or web address]

## 2023-09-06 NOTE — Patient Instructions (Signed)
 Visit Information  Thank you for taking time to visit with me today. Please don't hesitate to contact me if I can be of assistance to you before our next scheduled appointment.  Our next appointment is by telephone on 09/27/23 at 10:00 AM  Please call the care guide team at [phone] if you need to cancel or reschedule your appointment.   Following is a copy of your care plan:   Goals Addressed             This Visit's Progress    VBCI Social Work Care Plan       Problems:   Housing issues; looking for new residence             Biomedical scientist             Family support (sister is in [place]).              Job Stress issues             Interested in counseling support   CSW Clinical Goal(s):   Over the next 30 days the Patient will  attend scheduled medical appointments AEB patient report and documentation in EPIC record .              Over next 30 days the Patient will work with Herschel Senegal of house where she has applied to live to finalize housing plans for client AEB client report.  Interventions:  Discussed client needs and status             Client has put deposit on home in area and is waiting for final inspections on repairs to be able to move into home.  Discussed housing needs with client             Provided counseling support              Active Listening to allow client to share her needs and concerns             Discussed medication procurement              Informed client of [facility] in [place], [place] for counseling support. Informed client of [facility] in [place], [place]) for counseling support, outpatient. She said she was familiar with that agency and had a relative that had received support at that agency               Discussed pain issues. Discussed sleeping issues. Discussed financial stressors.Discussed support from church friends               Discussed food pantries such as Chiropodist support. Discussed food support with produce with local [employer]               Informed of program support with RN, LCSW, Pharmacist              Discussed client transport needs               Discussed client employment. She teaches classes at [employer]               Discussed family support. Her sister resides in [place]. She talks often via phone with her sister              Encouraged client to call LCSW for SW support at [phone]  Thanked client for phone call with LCSW today Patient Goals/Self-Care Activities:  Attend scheduled medical appointments            Take medications as prescribed            Communicate often with landlord of home where she wants to reside soon to inquire about repairs needed at home and when needed inspections will be completed              Practice self care with getting adequate rest, eating meals on schedule and following daily routine  Plan:   LCSW to call client on 09/27/23 at 10:00 AM        Please go to [facility] [address], [place] [phone]) if you are experiencing a Mental Health or Behavioral Health Crisis or need someone to talk to.  The patient verbalized understanding of instructions, educational materials, and care plan provided today and DECLINED offer to receive copy of patient instructions, educational materials, and care plan.   Patient was provided information on contacting care team for support. LCSW provided client with LCSW name and phone number for client to call for SW support   Lorna Few  MSW, LCSW [place]/[facility] Licensed Clinical Social Worker Direct Dial:  [phone] Fax:  [phone] Website:  [email or web address]

## 2023-09-21 ENCOUNTER — Telehealth: Payer: Self-pay | Admitting: Internal Medicine

## 2023-09-21 NOTE — Telephone Encounter (Signed)
 Inbound call from patient requesting to speak with nurse regarding questions she has for 5/1 colonoscopy. Please advise, thank you.

## 2023-09-21 NOTE — Telephone Encounter (Signed)
 Attempt to reach pt to help with any concerns or questions. LM with call bck #

## 2023-09-26 ENCOUNTER — Encounter (HOSPITAL_COMMUNITY): Payer: Self-pay | Admitting: Emergency Medicine

## 2023-09-26 ENCOUNTER — Emergency Department (HOSPITAL_COMMUNITY): Payer: Worker's Compensation

## 2023-09-26 ENCOUNTER — Emergency Department (HOSPITAL_COMMUNITY)
Admission: EM | Admit: 2023-09-26 | Discharge: 2023-09-26 | Disposition: A | Discharge status: Home / Self Care | Payer: Worker's Compensation | Attending: Emergency Medicine | Admitting: Emergency Medicine

## 2023-09-26 ENCOUNTER — Other Ambulatory Visit: Payer: Self-pay

## 2023-09-26 ENCOUNTER — Emergency Department (HOSPITAL_COMMUNITY)

## 2023-09-26 DIAGNOSIS — W108XXA Fall (on) (from) other stairs and steps, initial encounter: Secondary | ICD-10-CM | POA: Diagnosis not present

## 2023-09-26 DIAGNOSIS — M25561 Pain in right knee: Secondary | ICD-10-CM | POA: Diagnosis present

## 2023-09-26 DIAGNOSIS — Y990 Civilian activity done for income or pay: Secondary | ICD-10-CM | POA: Insufficient documentation

## 2023-09-26 DIAGNOSIS — Z7982 Long term (current) use of aspirin: Secondary | ICD-10-CM | POA: Diagnosis not present

## 2023-09-26 DIAGNOSIS — E119 Type 2 diabetes mellitus without complications: Secondary | ICD-10-CM | POA: Insufficient documentation

## 2023-09-26 DIAGNOSIS — Z91010 Allergy to peanuts: Secondary | ICD-10-CM | POA: Insufficient documentation

## 2023-09-26 DIAGNOSIS — W19XXXA Unspecified fall, initial encounter: Secondary | ICD-10-CM

## 2023-09-26 MED ORDER — ACETAMINOPHEN 500 MG PO TABS
1000.0000 mg | ORAL_TABLET | Freq: Once | ORAL | Status: AC
  Administered 2023-09-26: 1000 mg via ORAL
  Filled 2023-09-26: qty 2

## 2023-09-26 MED ORDER — OXYCODONE-ACETAMINOPHEN 5-325 MG PO TABS
1.0000 | ORAL_TABLET | Freq: Once | ORAL | Status: DC
  Filled 2023-09-26: qty 1

## 2023-09-26 NOTE — ED Notes (Addendum)
 Pt upset asking to speak to charge because she is in a hall bed and other ems patients have went to rooms. I assured pt that hall a is a real bed assignment and she will be treated there. Pt still upset. Pt assisted into wheelchair to go to the bathroom.

## 2023-09-26 NOTE — Discharge Instructions (Signed)
 Your x-rays did not show any bony injuries.  You may have soft tissue injuries to your right knee.  You can put weight on it within the tolerance of your pain.  You should take ibuprofen  and Tylenol  as needed for pain and elevate your leg when possible.  Call telephone number below to schedule a follow-up appointment with an orthopedic doctor.  Return to the emergency department for any new or worsening symptoms of concern.

## 2023-09-26 NOTE — ED Provider Notes (Signed)
 [place] EMERGENCY DEPARTMENT AT [facility] Provider Note   CSN: [id number] Arrival date & time: 09/26/23  1521     History  Chief Complaint  Patient presents with   Heather Cook    Heather Cook is a 63 y.o. female.  HPI Patient presents for follow-up.  Medical history includes CVA, PUD, GERD, DM.  Short of prior to arrival, she was walking down some steps.  She stepped wrong on the last step and this caused her to fall.  She denies striking her head.  She has had right knee pain since the fall.  She tried to walk on it but with difficulty.  She has some mild right elbow pain as well.  She denies any other areas of discomfort.    Home Medications Prior to Admission medications   Medication Sig Start Date End Date Taking? Authorizing Provider  aspirin  EC 81 MG tablet Take 1 tablet (81 mg total) by mouth daily. Swallow whole. 03/13/23   Newlin, Enobong, MD  atorvastatin  (LIPITOR) 40 MG tablet Take 1 tablet (40 mg total) by mouth daily. 08/21/23   Newlin, Enobong, MD  baclofen  (LIORESAL ) 10 MG tablet Take 1 tablet (10 mg total) by mouth 3 (three) times daily. 06/23/23   Starlene Eaton, FNP  cetirizine  (ZYRTEC ) 10 MG tablet Take 1 tablet (10 mg total) by mouth daily. 01/06/23   Newlin, Enobong, MD  clotrimazole  (LOTRIMIN ) 1 % cream Apply 1 Application topically 2 (two) times daily. 05/15/23   Newlin, Enobong, MD  empagliflozin  (JARDIANCE ) 25 MG TABS tablet Take 1 tablet (25 mg total) by mouth daily. 08/21/23   Newlin, Enobong, MD  EPINEPHrine  0.3 mg/0.3 mL IJ SOAJ injection Inject 0.3 mg into the muscle as needed for anaphylaxis. 10/13/22   Hassie Lint, PA-C  vitamin B-12 (CYANOCOBALAMIN ) 50 MCG tablet Take 50 mcg by mouth daily.    [provider]      Allergies    Penicillins, Almond oil, Peanuts [peanut oil], Morphine , Morphine  and codeine, Strawberry extract, and Tramadol     Review of Systems   Review of Systems  Musculoskeletal:  Positive for arthralgias.   All other systems reviewed and are negative.   Physical Exam Updated Vital Signs BP (!) 171/63   Pulse 71   Temp 98.5 F (36.9 C) (Oral)   Resp 18   LMP 08/12/2014 (Exact Date)   SpO2 100%  Physical Exam Vitals and nursing note reviewed.  Constitutional:      General: She is not in acute distress.    Appearance: Normal appearance. She is well-developed. She is not ill-appearing, toxic-appearing or diaphoretic.  HENT:     Head: Normocephalic and atraumatic.     Right Ear: External ear normal.     Left Ear: External ear normal.     Mouth/Throat:     Mouth: Mucous membranes are moist.  Eyes:     Extraocular Movements: Extraocular movements intact.     Conjunctiva/sclera: Conjunctivae normal.  Cardiovascular:     Rate and Rhythm: Normal rate and regular rhythm.     Pulses: Normal pulses.     Heart sounds: No murmur heard. Pulmonary:     Effort: Pulmonary effort is normal. No respiratory distress.     Breath sounds: Normal breath sounds. No wheezing or rales.  Chest:     Chest wall: No tenderness.  Abdominal:     General: There is no distension.     Palpations: Abdomen is soft.     Tenderness: There  is no abdominal tenderness.  Musculoskeletal:        General: Tenderness and signs of injury present. No swelling.     Cervical back: Normal range of motion and neck supple.  Skin:    General: Skin is warm and dry.     Coloration: Skin is not jaundiced or pale.  Neurological:     General: No focal deficit present.     Mental Status: She is alert and oriented to person, place, and time.  Psychiatric:        Mood and Affect: Mood normal.        Behavior: Behavior normal.     ED Results / Procedures / Treatments   Labs (all labs ordered are listed, but only abnormal results are displayed) Labs Reviewed - No data to display  EKG EKG Interpretation Date/Time:  Tuesday September 26 2023 19:42:36 EDT Ventricular Rate:  76 PR Interval:  153 QRS Duration:  96 QT  Interval:  400 QTC Calculation: 450 R Axis:   90  Text Interpretation: Sinus rhythm Borderline right axis deviation Borderline T abnormalities, anterior leads Confirmed by Iva Mariner [phone]) on 09/26/2023 7:44:22 PM  Radiology DG Foot 2 Views Left Result Date: 09/26/2023 CLINICAL DATA:  Left dorsal foot pain, fell down steps EXAM: LEFT FOOT - 2 VIEW COMPARISON:  None Available. FINDINGS: Frontal and lateral views of the left foot are obtained. No fracture, subluxation, or dislocation. Mild midfoot osteoarthritis. Small superior and inferior calcaneal spurs. Mild diffuse subcutaneous edema, greatest in the forefoot. IMPRESSION: 1. No acute displaced fracture. 2. Mild midfoot osteoarthritis. 3. Diffuse soft tissue swelling. Electronically Signed   By: Bobbye Burrow M.D.   On: 09/26/2023 19:20   DG Pelvis 1-2 Views Result Date: 09/26/2023 CLINICAL DATA:  Pain and swelling after a fall EXAM: PELVIS - 1-2 VIEW COMPARISON:  CT abdomen and pelvis 09/06/2020 FINDINGS: Degenerative changes in the lower lumbar spine and hips. Mild valgus orientation of the right hip is unchanged since prior study. Pelvis, sacrum, and hips appear intact. No acute displaced fractures are identified. No focal bone lesion or bone destruction. SI joints and symphysis pubis are not displaced. Soft tissues are unremarkable. IMPRESSION: No acute bony abnormalities.  Mild degenerative changes. Electronically Signed   By: Boyce Byes M.D.   On: 09/26/2023 17:26   DG Knee Complete 4 Views Right Result Date: 09/26/2023 CLINICAL DATA:  Pain and swelling after fall down steps. EXAM: RIGHT KNEE - COMPLETE 4+ VIEW COMPARISON:  None Available. FINDINGS: No evidence of fracture, dislocation, or joint effusion. Sclerosis in the distal femoral shaft likely representing benign bone island. No evidence of arthropathy or other focal bone abnormality. Soft tissues are unremarkable. IMPRESSION: No acute bony abnormalities. Electronically Signed    By: Boyce Byes M.D.   On: 09/26/2023 17:24   DG Elbow 2 Views Right Result Date: 09/26/2023 CLINICAL DATA:  Pain and swelling after fall down steps. EXAM: RIGHT ELBOW - 2 VIEW COMPARISON:  None Available. FINDINGS: There is no evidence of fracture, dislocation, or joint effusion. There is no evidence of arthropathy or other focal bone abnormality. Soft tissues are unremarkable. IMPRESSION: Negative. Electronically Signed   By: Boyce Byes M.D.   On: 09/26/2023 17:24   DG Chest 1 View Result Date: 09/26/2023 CLINICAL DATA:  Marvell Slider down steps. EXAM: CHEST  1 VIEW COMPARISON:  10/13/2022 FINDINGS: Normal heart size and pulmonary vascularity. No focal airspace disease or consolidation in the lungs. No blunting of costophrenic angles. No pneumothorax.  Mediastinal contours appear intact. Degenerative changes in the spine. IMPRESSION: No active disease. Electronically Signed   By: Boyce Byes M.D.   On: 09/26/2023 17:23    Procedures Procedures    Medications Ordered in ED Medications  oxyCODONE -acetaminophen  (PERCOCET/ROXICET) 5-325 MG per tablet 1 tablet (1 tablet Oral Not Given 09/26/23 1930)    ED Course/ Medical Decision Making/ A&P                                 Medical Decision Making Amount and/or Complexity of Data Reviewed Radiology: ordered.  Risk Prescription drug management.   Patient presents after mechanical fall.  During this fall, she did injure her right knee.  She has some mild elbow pain as well.  On exam, she is overall well-appearing.  She has no chest or abdominal tenderness.  She denies striking her head.  Right knee is notable for a mild abrasion to the anterior lower aspect.  There is no significant swelling or deformity.  Range of motion is limited by pain.  On right elbow, there is no evidence of swelling, deformity, or injury.  She has good range of motion.  She declines any pain medication.  X-ray imaging was ordered.  Initial imaging was negative.   Ace wrap was applied to right knee.  Patient now endorsing left foot pain.  Additional x-ray ordered.  Left foot x-ray was also negative.  Patient was discharged in stable condition.        Final Clinical Impression(s) / ED Diagnoses Final diagnoses:  Fall, initial encounter  Acute pain of right knee    Rx / DC Orders ED Discharge Orders     None         Iva Mariner, MD 09/26/23 1944

## 2023-09-26 NOTE — ED Triage Notes (Signed)
 Patient presents post fall down the last step. She now complains of right knee swelling, right knee and elbow pain. She did not hit her head, is not on blood thinners and did not lose consciousness.    EMS vitals: 176/94 BP 80 P 100% SPO2 on room air  164 CBG

## 2023-09-26 NOTE — ED Notes (Addendum)
 Patient is upset that she is being see in a hall bed. When RN approached she was agitated and expressed irritation with being in the fall and feeling as though no one was attending to her. Patient already had orders for xrays at that time. She inquired why EMS was told to take her to the hall bed instead of triage, believing going to triage first instead would have been better. RN explained that taking her straight to a hall bed decreased delay in care. She was still upset that she was being seen in the hall. RN explained that in the hall bed she was able to start care vs. waiting in the lobby for a room to become available for her and then be seen. She then asked why some people are seen in rooms and others in the hall. The RN explained that is often based on the chief complaint and what resources were needed. Patient was still upset and felt that she was not being seen and care was not progressing as fast as it should. Patient had been here less than 30 mins at that point. Patient continued to complain about her complete experience thus far, including believing she was attached to the equipment too long while RN was documenting her vitals. Patient also expressed with other staff members her discontent, in some cases to the point they were unable to carry out needed tasks. Charge also addressed the patient's complaints and explained to her why she was being seen in the hall as well.

## 2023-09-26 NOTE — Progress Notes (Signed)
 Orthopedic Tech Progress Note Patient Details:  Heather Cook [birth year] [id number]  Ortho Devices Type of Ortho Device: Crutches Ortho Device/Splint Interventions: Ordered, Application, Adjustment   Post Interventions Patient Tolerated: Well Instructions Provided: Adjustment of device  [name] 09/26/2023, 7:49 PM

## 2023-09-27 ENCOUNTER — Other Ambulatory Visit: Payer: Self-pay | Admitting: Licensed Clinical Social Worker

## 2023-09-27 NOTE — Patient Instructions (Signed)
 Visit Information  Thank you for taking time to visit with me today. Please don't hesitate to contact me if I can be of assistance to you before our next scheduled appointment.  Our next appointment is by telephone on 11/20/23 at 2:30 PM   Please call the care guide team at [phone] if you need to cancel or reschedule your appointment.   Following is a copy of your care plan:   Goals Addressed             This Visit's Progress    VBCI Social Work Care Plan       Problems:   Recent fall with injury to right knee             Pain issues            Currently using crutches to help her ambulate             Transport needs             Housing challenges  CSW Clinical Goal(s):   Over the next 30 days the Patient will attend all scheduled medical appointments as evidenced by patient report and care team review of appointment completion in electronic MEDICAL RECORD NUMBER  .             Over next 30 days, the Patient will access support from friends and family to help her complete ADLs daily AEB patient report of completing needed ADLs on daily basis   Interventions:  Discussed medication procurement             Discussed pain issues faced             Discussed client recent fall and treatment for her fall              Discussed housing challenges for client             Discussed client support from friends and family. Encouraged client to access support from friends and family in next 30 days (due to her current needs)             Encouraged [name] to call LCSW as needed for SW support  Patient Goals/Self-Care Activities:   Client to attend scheduled medical appointments            Client to access support from friends and family related to meeting her current    needs             Client to call LCSW as needed for SW support               Client to seek transport help from friends and family as needed              Client to take medications as prescribed               Client to  allow extra time for ADLs completion  Plan:   Telephone follow up appointment with care management team member scheduled for:  11/20/23 at 2:30 PM         Please go to [facility] [address] [place] [id number]) if you are experiencing a Mental Health or Behavioral Health Crisis or need someone to talk to.  The patient verbalized understanding of instructions, educational materials, and care plan provided today and DECLINED offer to receive copy of patient instructions, educational materials, and care plan.   Patient received information on how to contact  Care Team as needed. LCSW provided client with LCSW name and phone number and encouraged her to call LCSW as needed for SW support   Heather Cook  MSW, LCSW /[facility] Licensed Clinical Social Worker Direct Dial:  [phone] Fax:  [phone] Website:  [email or web address]

## 2023-09-27 NOTE — Patient Outreach (Signed)
 Complex Care Management   Visit Note  09/27/2023  Name:  Heather Cook MRN: [id number] DOB: Oct 23, 1960  Situation: Referral received for Complex Care Management related to  housing needs and management of medical needs faced  I obtained verbal consent from Patient.  Visit completed with patient  on the phone  Background:   Past Medical History:  Diagnosis Date   Diabetes mellitus without complication (HCC)    GERD (gastroesophageal reflux disease)    H. pylori infection    History of stomach ulcers    Sciatica    Stroke [facility])     Assessment: Patient Reported Symptoms:  Cognitive    Memory impairment    Neurological  Stress issues; memory impairment; pain issues from fall    HEENT    No issues noted    Cardiovascular  CVA; TIA    Respiratory  No issues noted    Endocrine    DM  Gastrointestinal    GERD    Genitourinary  No issues noted    Integumentary    Unable to access  Musculoskeletal    Recent fall with injury to right knee; using crutches to ambulate; needs extra time to complete ADLs; pain issues      Psychosocial    Housing challenges; financial challenges; transport needs   Quality of Family Relationships: supportive Do you feel physically threatened by others?: No      09/27/2023    9:12 AM  Depression screen PHQ 2/9  Decreased Interest 1  Down, Depressed, Hopeless 1  PHQ - 2 Score 2  Altered sleeping 1  Tired, decreased energy 1  Change in appetite 0  Feeling bad or failure about yourself  1  Trouble concentrating 1  Moving slowly or fidgety/restless 1  Suicidal thoughts 0  PHQ-9 Score 7  Difficult doing work/chores Somewhat difficult    Vitals:  Client did not mention any BP issues  Medications Reviewed Today     Reviewed by Afton Horse (Social Worker) on 09/27/23 at 0909  Med List Status: <None>   Medication Order Taking? Sig Documenting Provider Last Dose Status Informant  aspirin  EC 81 MG tablet [id number] No Take 1  tablet (81 mg total) by mouth daily. Swallow whole. Newlin, Enobong, MD Taking Active   atorvastatin  (LIPITOR) 40 MG tablet [id number]  Take 1 tablet (40 mg total) by mouth daily. Newlin, Enobong, MD  Active   baclofen  (LIORESAL ) 10 MG tablet [id number] No Take 1 tablet (10 mg total) by mouth 3 (three) times daily. Starlene Eaton, FNP Taking Active   cetirizine  (ZYRTEC ) 10 MG tablet [id number] No Take 1 tablet (10 mg total) by mouth daily. Newlin, Enobong, MD Taking Active   clotrimazole  (LOTRIMIN ) 1 % cream [id number] No Apply 1 Application topically 2 (two) times daily. Newlin, Enobong, MD Taking Active   empagliflozin  (JARDIANCE ) 25 MG TABS tablet [id number]  Take 1 tablet (25 mg total) by mouth daily. Newlin, Enobong, MD  Active   EPINEPHrine  0.3 mg/0.3 mL IJ SOAJ injection [id number] No Inject 0.3 mg into the muscle as needed for anaphylaxis. Hassie Lint, PA-C Taking Active   vitamin B-12 (CYANOCOBALAMIN ) 50 MCG tablet [id number] No Take 50 mcg by mouth daily. [provider] Taking Active             Recommendation:   Client to attend scheduled medical appointments and to take medications as prescribed Client to access support from friends and family in next 30 days related to client meals,  transport, daily support Client to allow time for ADLs completion Client to allow time for self care  Follow Up Plan:   Telephone follow up appointment date/time:  11/20/23 at 2:30 PM    Alexandria Angel  MSW, LCSW [place]/Value Based [facility] Licensed Clinical Social Worker Direct Dial:  [phone] Fax:  [phone] Website:  [email or web address]

## 2023-09-28 ENCOUNTER — Encounter: Admitting: Internal Medicine

## 2023-10-03 ENCOUNTER — Other Ambulatory Visit: Payer: Self-pay

## 2023-10-03 MED ORDER — DICLOFENAC SODIUM 1 % EX GEL
2.0000 g | CUTANEOUS | 0 refills | Status: AC
  Filled 2023-10-03: qty 100, 16d supply, fill #0

## 2023-10-04 ENCOUNTER — Ambulatory Visit (AMBULATORY_SURGERY_CENTER)

## 2023-10-04 ENCOUNTER — Other Ambulatory Visit: Payer: Self-pay

## 2023-10-04 VITALS — Ht 63.5 in | Wt 198.0 lb

## 2023-10-04 DIAGNOSIS — Z1211 Encounter for screening for malignant neoplasm of colon: Secondary | ICD-10-CM

## 2023-10-04 MED ORDER — PEG 3350-KCL-NA BICARB-NACL 420 G PO SOLR
4000.0000 mL | Freq: Once | ORAL | 0 refills | Status: AC
  Filled 2023-10-04: qty 4000, 1d supply, fill #0

## 2023-10-04 NOTE — Progress Notes (Signed)
 No egg or soy allergy known to patient  No issues known to pt with past sedation with any surgeries or procedures Patient denies ever being told they had issues or difficulty with intubation  No FH of Malignant Hyperthermia Pt is not on diet pills Pt is not on  home 02  Pt is not on blood thinners  Pt denies issues with constipation  No A fib or A flutter Have any cardiac testing pending--no Pt can ambulate with assistantance Pt denies use of chewing tobacco Discussed diabetic I weight loss medication holds Discussed NSAID holds Checked BMI Pt instructed to use Single[employer] or GoodRx for a price reduction on prep  Patient's chart reviewed by Rogena Class CNRA prior to previsit and patient appropriate for the [facility].  Pre visit completed and red dot placed by patient's name on their procedure day (on provider's schedule).

## 2023-10-10 ENCOUNTER — Ambulatory Visit: Payer: Medicaid Other | Admitting: Physician Assistant

## 2023-10-12 ENCOUNTER — Other Ambulatory Visit: Payer: Self-pay

## 2023-10-17 NOTE — Progress Notes (Incomplete)
 Assessment/Plan:   Dementia likely due to ***  Heather Cook is a delightful 63 y.o. RH female with a history of***seen today in follow up for memory loss. Patient is currently on***.      Follow up in   months. Continue*** Recommend good control of her cardiovascular risk factors Continue to control mood as per PCP     Subjective:    This patient is accompanied in the office by *** who supplements the history.  Previous records as well as any outside records available were reviewed prior to todays visit. Patient was last seen on 03/2023, MoCA of 26/30***   Any changes in memory since last visit? ". Denies difficulty remembering recent conversations, but she has some difficulty with names of people. LTM is good  repeats oneself?  Endorsed Disoriented when walking into a room? Denies ***  Leaving objects?  May misplace things but not in unusual places***  Wandering behavior?  denies   Any personality changes since last visit?  Denies.   Any worsening depression?:  Denies.   Hallucinations or paranoia?  Denies.   Seizures? denies    Any sleep changes?  Denies vivid dreams, REM behavior or sleepwalking   Sleep apnea?   Denies.   Any hygiene concerns? Denies.  Independent of bathing and dressing?  Endorsed  Does the patient needs help with medications?   is in charge *** Who is in charge of the finances?   is in charge   *** Any changes in appetite?  denies ***   Patient have trouble swallowing? Denies.   Does the patient cook? No Any headaches?   denies   Any vision changes?*** Chronic back pain  denies   Ambulates with difficulty? Denies.  *** Recent falls or head injuries?On 09/26/23 she sustained a mechanical fall when she was walking down the steps and hit her R knee and elbow, no fracture, no head injury, no LOC   Stroke like symptoms? No further events  Any tremors?  Denies   Any anosmia?  Denies   Any incontinence of urine?  Endorsed***  Any bowel dysfunction?    Denies      Patient lives   *** Does the patient drive? Yes, denies any issues. ***  History o present illness 03/2023  How long did patient have memory difficulties? Around the same time of the stroke. "It is more with word retrieval". 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  Denies forgetting conversations , may not recall names, then again" there are new students every semester".  Long-term memory is good. repeats oneself?  Denies  Disoriented when walking into a room?  Patient denies   Leaving objects in unusual places? Denies.   Wandering behavior?  denies .  Any personality changes?  Denies.   Any history of depression?:  Denies   Hallucinations or paranoia?  Denies   Seizures?  Denies    Any sleep changes? Does not sleep well  Sometimes vivid dreams, denies  REM behavior or sleepwalking   Sleep apnea?  Denies   Any hygiene concerns?  Denies   Independent of bathing and dressing?  Endorsed  Does the patient needs help with medications? Patient is in charge   Who is in charge of the finances? Patient is in charge .     Any changes in appetite?  Denies     Patient have trouble swallowing? Denies.   Does the patient cook? Yes, denies any issues Any kitchen accidents such as leaving the  stove on? Denies.   Any history of headaches? In the past, stress related   Chronic pain ? Denies.   Ambulates with difficulty?  Denies.   Recent falls or head injuries? Denies.   Vision changes? Denies.   Unilateral weakness, numbness or tingling? Denies.   Any tremors?   Denies.   Any anosmia?  Denies.   Any incontinence of urine? Denies.   Any bowel dysfunction? Denies.      Patient lives with  grandson History of heavy alcohol intake? Denies.   History of heavy tobacco use? Denies.   Family history of dementia? Mo may have had dementia unknown type  Does patient drive? Yes, denies any issues     PREVIOUS MEDICATIONS:   CURRENT MEDICATIONS:  Outpatient Encounter Medications as of 10/18/2023   Medication Sig   aspirin  EC 81 MG tablet Take 1 tablet (81 mg total) by mouth daily. Swallow whole. (Patient not taking: Reported on 10/04/2023)   atorvastatin  (LIPITOR) 40 MG tablet Take 1 tablet (40 mg total) by mouth daily.   baclofen  (LIORESAL ) 10 MG tablet Take 1 tablet (10 mg total) by mouth 3 (three) times daily.   cetirizine  (ZYRTEC ) 10 MG tablet Take 1 tablet (10 mg total) by mouth daily.   clotrimazole  (LOTRIMIN ) 1 % cream Apply 1 Application topically 2 (two) times daily.   diclofenac  Sodium (VOLTAREN ) 1 % GEL Apply 2 grams to the affected area(s) topically 2 to 3 times a day.   empagliflozin  (JARDIANCE ) 25 MG TABS tablet Take 1 tablet (25 mg total) by mouth daily.   EPINEPHrine  0.3 mg/0.3 mL IJ SOAJ injection Inject 0.3 mg into the muscle as needed for anaphylaxis.   vitamin B-12 (CYANOCOBALAMIN ) 50 MCG tablet Take 50 mcg by mouth daily. (Patient not taking: Reported on 10/04/2023)   No facility-administered encounter medications on file as of 10/18/2023.        No data to display            04/10/2023    2:00 PM  Montreal Cognitive Assessment   Visuospatial/ Executive (0/5) 5  Naming (0/3) 3  Attention: Read list of digits (0/2) 2  Attention: Read list of letters (0/1) 1  Attention: Serial 7 subtraction starting at 100 (0/3) 3  Language: Repeat phrase (0/2) 2  Language : Fluency (0/1) 1  Abstraction (0/2) 0  Delayed Recall (0/5) 3  Orientation (0/6) 6  Total 26  Adjusted Score (based on education) 26    Objective:     PHYSICAL EXAMINATION:    VITALS:  There were no vitals filed for this visit.  GEN:  The patient appears stated age and is in NAD. HEENT:  Normocephalic, atraumatic.   Neurological examination:  General: NAD, well-groomed, appears stated age. Orientation: The patient is alert. Oriented to person, place and date Cranial nerves: There is good facial symmetry.The speech is fluent and clear. No aphasia or dysarthria. Fund of knowledge is  appropriate. Recent and remote memory are impaired. Attention and concentration are reduced. Able to name objects and repeat phrases.  Hearing is intact to conversational tone. *** Sensation: Sensation is intact to light touch throughout Motor: Strength is at least antigravity x4. DTR's 2/4 in UE/LE     Movement examination: Tone: There is normal tone in the UE/LE Abnormal movements:  no tremor.  No myoclonus.  No asterixis.   Coordination:  There is no decremation with RAM's. Normal finger to nose  Gait and Station: The patient has no*** difficulty arising out  of a deep-seated chair without the use of the hands. The patient's stride length is good.  Gait is cautious and narrow.    Thank you for allowing us  the opportunity to participate in the care of this nice patient. Please do not hesitate to contact us  for any questions or concerns.   Total time spent on today's visit was *** minutes dedicated to this patient today, preparing to see patient, examining the patient, ordering tests and/or medications and counseling the patient, documenting clinical information in the EHR or other health record, independently interpreting results and communicating results to the patient/family, discussing treatment and goals, answering patient's questions and coordinating care.  Cc:  Joaquin Mulberry, MD  Tex Filbert 10/17/2023 7:12 PM

## 2023-10-18 ENCOUNTER — Ambulatory Visit: Admitting: Physician Assistant

## 2023-10-19 ENCOUNTER — Ambulatory Visit: Admitting: Physician Assistant

## 2023-10-20 ENCOUNTER — Encounter: Payer: Self-pay | Admitting: Internal Medicine

## 2023-10-20 ENCOUNTER — Ambulatory Visit (AMBULATORY_SURGERY_CENTER): Admitting: Internal Medicine

## 2023-10-20 VITALS — BP 150/80 | HR 83 | Temp 97.9°F | Resp 14 | Ht 63.5 in | Wt 198.0 lb

## 2023-10-20 DIAGNOSIS — K6389 Other specified diseases of intestine: Secondary | ICD-10-CM

## 2023-10-20 DIAGNOSIS — D122 Benign neoplasm of ascending colon: Secondary | ICD-10-CM

## 2023-10-20 DIAGNOSIS — Z1211 Encounter for screening for malignant neoplasm of colon: Secondary | ICD-10-CM

## 2023-10-20 DIAGNOSIS — K648 Other hemorrhoids: Secondary | ICD-10-CM

## 2023-10-20 DIAGNOSIS — K5730 Diverticulosis of large intestine without perforation or abscess without bleeding: Secondary | ICD-10-CM

## 2023-10-20 MED ORDER — SODIUM CHLORIDE 0.9 % IV SOLN: 500.0000 mL | Freq: Once | INTRAVENOUS | Status: DC

## 2023-10-20 NOTE — Progress Notes (Signed)
 Pt's states no medical or surgical changes since previsit or office visit.

## 2023-10-20 NOTE — Progress Notes (Signed)
 Pt sedate, gd SR's, VSS, report to RN

## 2023-10-20 NOTE — Patient Instructions (Signed)
 Resume previous diet and medications. Awaiting pathology results. Repeat Colonoscopy date to be determined based on pathology results. Handouts provided on polyps, Diverticulosis and Hemorrhoids  YOU HAD AN ENDOSCOPIC PROCEDURE TODAY AT THE [facility]:   Refer to the procedure report that was given to you for any specific questions about what was found during the examination.  If the procedure report does not answer your questions, please call your gastroenterologist to clarify.  If you requested that your care partner not be given the details of your procedure findings, then the procedure report has been included in a sealed envelope for you to review at your convenience later.  YOU SHOULD EXPECT: Some feelings of bloating in the abdomen. Passage of more gas than usual.  Walking can help get rid of the air that was put into your GI tract during the procedure and reduce the bloating. If you had a lower endoscopy (such as a colonoscopy or flexible sigmoidoscopy) you may notice spotting of blood in your stool or on the toilet paper. If you underwent a bowel prep for your procedure, you may not have a normal bowel movement for a few days.  Please Note:  You might notice some irritation and congestion in your nose or some drainage.  This is from the oxygen used during your procedure.  There is no need for concern and it should clear up in a day or so.  SYMPTOMS TO REPORT IMMEDIATELY:  Following lower endoscopy (colonoscopy or flexible sigmoidoscopy):  Excessive amounts of blood in the stool  Significant tenderness or worsening of abdominal pains  Swelling of the abdomen that is new, acute  Fever of 100F or higher  For urgent or emergent issues, a gastroenterologist can be reached at any hour by calling [phone]. Do not use MyChart messaging for urgent concerns.    DIET:  We do recommend a small meal at first, but then you may proceed to your regular diet.  Drink plenty of fluids  but you should avoid alcoholic beverages for 24 hours.  ACTIVITY:  You should plan to take it easy for the rest of today and you should NOT DRIVE or use heavy machinery until tomorrow (because of the sedation medicines used during the test).    FOLLOW UP: Our staff will call the number listed on your records the next business day following your procedure.  We will call around 7:15- 8:00 am to check on you and address any questions or concerns that you may have regarding the information given to you following your procedure. If we do not reach you, we will leave a message.     If any biopsies were taken you will be contacted by phone or by letter within the next 1-3 weeks.  Please call us  at [phone] if you have not heard about the biopsies in 3 weeks.    SIGNATURES/CONFIDENTIALITY: You and/or your care partner have signed paperwork which will be entered into your electronic medical record.  These signatures attest to the fact that that the information above on your After Visit Summary has been reviewed and is understood.  Full responsibility of the confidentiality of this discharge information lies with you and/or your care-partner.

## 2023-10-20 NOTE — Op Note (Signed)
 [facility] Patient Name: Heather Cook Procedure Date: 10/20/2023 3:35 PM MRN: [id number] Endoscopist: Pedro Bourgeois , , [id number] Age: 63 Referring MD:  Date of Birth: Nov 04, 1960 Gender: Female Account #: [id number] Procedure:                Colonoscopy Indications:              Screening for colorectal malignant neoplasm, This                            is the patient's first colonoscopy Medicines:                Monitored Anesthesia Care Procedure:                Pre-Anesthesia Assessment:                           - Prior to the procedure, a History and Physical                            was performed, and patient medications and                            allergies were reviewed. The patient's tolerance of                            previous anesthesia was also reviewed. The risks                            and benefits of the procedure and the sedation                            options and risks were discussed with the patient.                            All questions were answered, and informed consent                            was obtained. Prior Anticoagulants: The patient has                            taken no anticoagulant or antiplatelet agents. ASA                            Grade Assessment: III - A patient with severe                            systemic disease. After reviewing the risks and                            benefits, the patient was deemed in satisfactory                            condition to undergo the procedure.  After obtaining informed consent, the colonoscope                            was passed under direct vision. Throughout the                            procedure, the patient's blood pressure, pulse, and                            oxygen saturations were monitored continuously. The                            CF [id number] #[id number] was introduced through the anus                            and advanced to  the the cecum, identified by                            appendiceal orifice and ileocecal valve. The                            colonoscopy was performed without difficulty. The                            patient tolerated the procedure well. The quality                            of the bowel preparation was excellent. The                            terminal ileum, ileocecal valve, appendiceal                            orifice, and rectum were photographed. Scope In: 3:49:31 PM Scope Out: 4:07:04 PM Scope Withdrawal Time: 0 hours 10 minutes 42 seconds  Total Procedure Duration: 0 hours 17 minutes 33 seconds  Findings:                 The terminal ileum appeared normal.                           A 3 mm polyp was found in the ascending colon. The                            polyp was sessile. The polyp was removed with a                            cold snare. Resection and retrieval were complete.                           A few diverticula were found in the sigmoid colon.                           Non-bleeding internal hemorrhoids were found during  retroflexion. Complications:            No immediate complications. Estimated Blood Loss:     Estimated blood loss was minimal. Impression:               - The examined portion of the ileum was normal.                           - One 3 mm polyp in the ascending colon, removed                            with a cold snare. Resected and retrieved.                           - Diverticulosis in the sigmoid colon.                           - Non-bleeding internal hemorrhoids. Recommendation:           - Discharge patient to home (with escort).                           - Await pathology results.                           - The findings and recommendations were discussed                            with the patient. Dr Pedro Bourgeois "Anastacio Balm" Rosaline Coma,  10/20/2023 4:09:56 PM

## 2023-10-20 NOTE — Progress Notes (Signed)
 Called to room to assist during endoscopic procedure.  Patient ID and intended procedure confirmed with present staff. Received instructions for my participation in the procedure from the performing physician.

## 2023-10-20 NOTE — Progress Notes (Signed)
 GASTROENTEROLOGY PROCEDURE H&P NOTE   Primary Care Physician: Joaquin Mulberry, MD    Reason for Procedure:   Colon cancer screening  Plan:    Colonoscopy  Patient is appropriate for endoscopic procedure(s) in the ambulatory ([facility]) setting.  The nature of the procedure, as well as the risks, benefits, and alternatives were carefully and thoroughly reviewed with the patient. Ample time for discussion and questions allowed. The patient understood, was satisfied, and agreed to proceed.     HPI: Heather Cook is a 63 y.o. female who presents for colonoscopy for colon cancer screening. Denies blood in stools, changes in bowel habits, or unintentional weight loss. Denies family history of colon cancer.  Past Medical History:  Diagnosis Date   Arthritis    Blood transfusion without reported diagnosis    at age 20   Diabetes mellitus without complication (HCC)    GERD (gastroesophageal reflux disease)    H. pylori infection    History of stomach ulcers    Hyperlipidemia    Sciatica    Stroke [facility])     Past Surgical History:  Procedure Laterality Date   TUBAL LIGATION      Prior to Admission medications   Medication Sig Start Date End Date Taking? Authorizing Provider  aspirin  EC 81 MG tablet Take 1 tablet (81 mg total) by mouth daily. Swallow whole. Patient not taking: Reported on 10/04/2023 03/13/23   Newlin, Enobong, MD  atorvastatin  (LIPITOR) 40 MG tablet Take 1 tablet (40 mg total) by mouth daily. 08/21/23   Newlin, Enobong, MD  baclofen  (LIORESAL ) 10 MG tablet Take 1 tablet (10 mg total) by mouth 3 (three) times daily. 06/23/23   Starlene Eaton, FNP  cetirizine  (ZYRTEC ) 10 MG tablet Take 1 tablet (10 mg total) by mouth daily. 01/06/23   Newlin, Enobong, MD  clotrimazole  (LOTRIMIN ) 1 % cream Apply 1 Application topically 2 (two) times daily. 05/15/23   Newlin, Enobong, MD  diclofenac  Sodium (VOLTAREN ) 1 % GEL Apply 2 grams to the affected area(s) topically 2 to 3 times a  day. 10/02/23     empagliflozin  (JARDIANCE ) 25 MG TABS tablet Take 1 tablet (25 mg total) by mouth daily. 08/21/23   Newlin, Enobong, MD  EPINEPHrine  0.3 mg/0.3 mL IJ SOAJ injection Inject 0.3 mg into the muscle as needed for anaphylaxis. 10/13/22   Hassie Lint, PA-C  vitamin B-12 (CYANOCOBALAMIN ) 50 MCG tablet Take 50 mcg by mouth daily. Patient not taking: Reported on 10/04/2023    [provider]    Current Outpatient Medications  Medication Sig Dispense Refill   aspirin  EC 81 MG tablet Take 1 tablet (81 mg total) by mouth daily. Swallow whole. (Patient not taking: Reported on 10/04/2023) 90 tablet 1   atorvastatin  (LIPITOR) 40 MG tablet Take 1 tablet (40 mg total) by mouth daily. 90 tablet 1   baclofen  (LIORESAL ) 10 MG tablet Take 1 tablet (10 mg total) by mouth 3 (three) times daily. 30 each 0   cetirizine  (ZYRTEC ) 10 MG tablet Take 1 tablet (10 mg total) by mouth daily. 30 tablet 0   clotrimazole  (LOTRIMIN ) 1 % cream Apply 1 Application topically 2 (two) times daily. 30 g 0   diclofenac  Sodium (VOLTAREN ) 1 % GEL Apply 2 grams to the affected area(s) topically 2 to 3 times a day. 100 g 0   empagliflozin  (JARDIANCE ) 25 MG TABS tablet Take 1 tablet (25 mg total) by mouth daily. 90 tablet 1   EPINEPHrine  0.3 mg/0.3 mL IJ SOAJ  injection Inject 0.3 mg into the muscle as needed for anaphylaxis. 2 each 1   vitamin B-12 (CYANOCOBALAMIN ) 50 MCG tablet Take 50 mcg by mouth daily. (Patient not taking: Reported on 10/04/2023)     Current Facility-Administered Medications  Medication Dose Route Frequency Provider Last Rate Last Admin   0.9 %  sodium chloride  infusion  500 mL Intravenous Once Daina Drum, MD        Allergies as of 10/20/2023 - Review Complete 10/20/2023  Allergen Reaction Noted   Penicillins Swelling 01/10/2012   Almond oil Swelling 03/21/2014   Peanuts [peanut oil] Swelling 03/14/2013   Morphine  Other (See Comments) 03/28/2014   Morphine  and codeine Nausea And Vomiting  07/06/2017   Strawberry extract Other (See Comments) 03/14/2013   Tramadol  Nausea Only 07/06/2017    Family History  Adopted: Yes  Problem Relation Age of Onset   Dementia Mother    Heart attack Mother    Stomach cancer Sister 75 - 76   Colon cancer Neg Hx    Colon polyps Neg Hx     Social History   Socioeconomic History   Marital status: Single    Spouse name: Not on file   Number of children: Not on file   Years of education: Not on file   Highest education level: Not on file  Occupational History   Not on file  Tobacco Use   Smoking status: Never   Smokeless tobacco: Never  Vaping Use   Vaping status: Not on file  Substance and Sexual Activity   Alcohol use: No   Drug use: No   Sexual activity: Not Currently  Other Topics Concern   Not on file  Social History Narrative   Right Handed    Lives in apartment on the 3rd floor. One story floor plan   Social Drivers of Health   Financial Resource Strain: High Risk (05/15/2023)   Overall Financial Resource Strain (CARDIA)    Difficulty of Paying Living Expenses: Very hard  Food Insecurity: Food Insecurity Present (09/27/2023)   Hunger Vital Sign    Worried About Running Out of Food in the Last Year: Sometimes true    Ran Out of Food in the Last Year: Sometimes true  Transportation Needs: No Transportation Needs (09/27/2023)   PRAPARE - Administrator, Civil Service (Medical): No    Lack of Transportation (Non-Medical): No  Physical Activity: Inactive (09/27/2023)   Exercise Vital Sign    Days of Exercise per Week: 0 days    Minutes of Exercise per Session: 0 min  Stress: Stress Concern Present (09/27/2023)   [employer] of Occupational Health - Occupational Stress Questionnaire    Feeling of Stress : Rather much  Social Connections: Moderately Integrated (03/14/2023)   Social Connection and Isolation Panel [NHANES]    Frequency of Communication with Friends and Family: Once a week    Frequency  of Social Gatherings with Friends and Family: More than three times a week    Attends Religious Services: 1 to 4 times per year    Active Member of [employer] or Organizations: Yes    Attends Banker Meetings: 1 to 4 times per year    Marital Status: Never married  Intimate Partner Violence: Not At Risk (09/27/2023)   Humiliation, Afraid, Rape, and Kick questionnaire    Fear of Current or Ex-Partner: No    Emotionally Abused: No    Physically Abused: No    Sexually Abused: No  Physical Exam: Vital signs in last 24 hours: LMP 08/12/2014 (Exact Date)  GEN: NAD EYE: Sclerae anicteric ENT: MMM CV: Non-tachycardic Pulm: No increased work of breathing GI: Soft, NT/ND NEURO:  Alert & Oriented   Regino Caprio, MD [facility]  10/20/2023 3:03 PM

## 2023-10-24 ENCOUNTER — Ambulatory Visit: Admitting: Physician Assistant

## 2023-10-24 ENCOUNTER — Telehealth: Payer: Self-pay

## 2023-10-24 ENCOUNTER — Encounter: Payer: Self-pay | Admitting: Physician Assistant

## 2023-10-24 VITALS — BP 138/65 | HR 82 | Resp 18 | Ht 63.0 in | Wt 198.0 lb

## 2023-10-24 DIAGNOSIS — R413 Other amnesia: Secondary | ICD-10-CM

## 2023-10-24 NOTE — Progress Notes (Signed)
 Assessment/Plan:   Memory Impairment of unclear etiology   Heather Cook is a delightful 63 y.o. RH female with a history of DM2, GERD, hypertension, hyperlipidemia, history of complex migraines, seen today in follow up for memory loss. Memory is stable. Patient is able to participate on ADLs and to drive without difficulties. She remains stressed, we discussed again the role of psychotherapy.       Follow up in 6 months. Continue to replenish B12 (293) Recommend good control of her cardiovascular risk factors, secondary stroke prevention Recommend good control of her A1c Increase physical activity Stress reduction is recommended, consider psychotherapy Continue to control mood as per PCP     Subjective:    This patient is here alone who supplements the history.  Previous records as well as any outside records available were reviewed prior to todays visit. Patient was last seen on 03/2023, MoCA of 26/30    Any changes in memory since last visit? "About the same". Denies difficulty remembering recent conversations, but she has some difficulty with names of people, word retrieval. LTM is good. She is now retired from her job as Programmer, systems as her trouble remembering her student's name was causing added stress.   repeats oneself?  Endorsed Disoriented when walking into a room? Denies   Leaving objects?  May misplace things but not in unusual places  Wandering behavior?  denies   Any personality changes since last visit?  She has been significant amount of stress due to her current living situation. Her home is being repaired, and she has to live with other [employer]  members who "have medical problems of their own". "I would like to live by myself".  Any worsening depression?:  Denies.   Hallucinations or paranoia?  Denies.   Seizures? denies    Any sleep changes?  Denies vivid dreams, REM behavior or sleepwalking   Sleep apnea?   Denies.   Any hygiene concerns? Denies.  Independent of  bathing and dressing?  Endorsed  Does the patient needs help with medications? Patient is in charge  Who is in charge of the finances? Patient  is in charge    Any changes in appetite?  Denies. Drinks enough water     Patient have trouble swallowing? Denies.   Does the patient cook? Yes," but I do in someone's house, so it is limited". Any headaches?   denies   Any vision changes? Denies  Chronic back pain  denies   Ambulates with difficulty? Denies.    Recent falls or head injuries? On 09/26/23 she sustained a mechanical fall when she was walking down the steps and hit her R knee and elbow, L ankle, no fracture, no head injury, no LOC   Stroke like symptoms? No further events.  Any tremors?  Denies   Any anosmia?  Denies   Any incontinence of urine? Denies  Any bowel dysfunction?   Denies      Patient lives with some [employer] members, after living for about 2 months with her son in a hotel.  Does the patient drive? Yes, denies any issues.    History o present illness 03/2023  How long did patient have memory difficulties? Around the same time of the stroke. "It is more with word retrieval". 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  Denies forgetting conversations , may not recall names, then again" there are new students every semester".  Long-term memory is good. repeats oneself?  Denies  Disoriented when walking into a room?  Patient denies   Leaving objects in unusual places? Denies.   Wandering behavior?  denies .  Any personality changes?  Denies.   Any history of depression?:  Denies   Hallucinations or paranoia?  Denies   Seizures?  Denies    Any sleep changes? Does not sleep well  Sometimes vivid dreams, denies  REM behavior or sleepwalking   Sleep apnea?  Denies   Any hygiene concerns?  Denies   Independent of bathing and dressing?  Endorsed  Does the patient needs help with medications? Patient is in charge   Who is in charge of the finances? Patient is in charge .     Any changes in  appetite?  Denies     Patient have trouble swallowing? Denies.   Does the patient cook? Yes, denies any issues Any kitchen accidents such as leaving the stove on? Denies.   Any history of headaches? In the past, stress related   Chronic pain ? Denies.   Ambulates with difficulty?  Denies.   Recent falls or head injuries? Denies.   Vision changes? Denies.   Unilateral weakness, numbness or tingling? Denies.   Any tremors?   Denies.   Any anosmia?  Denies.   Any incontinence of urine? Denies.   Any bowel dysfunction? Denies.      Patient lives with  grandson History of heavy alcohol intake? Denies.   History of heavy tobacco use? Denies.   Family history of dementia? Mo may have had dementia unknown type  Does patient drive? Yes, denies any issues     PREVIOUS MEDICATIONS:   CURRENT MEDICATIONS:  Outpatient Encounter Medications as of 10/24/2023  Medication Sig   atorvastatin  (LIPITOR) 40 MG tablet Take 1 tablet (40 mg total) by mouth daily.   baclofen  (LIORESAL ) 10 MG tablet Take 1 tablet (10 mg total) by mouth 3 (three) times daily.   cetirizine  (ZYRTEC ) 10 MG tablet Take 1 tablet (10 mg total) by mouth daily.   clotrimazole  (LOTRIMIN ) 1 % cream Apply 1 Application topically 2 (two) times daily.   diclofenac  Sodium (VOLTAREN ) 1 % GEL Apply 2 grams to the affected area(s) topically 2 to 3 times a day.   empagliflozin  (JARDIANCE ) 25 MG TABS tablet Take 1 tablet (25 mg total) by mouth daily.   EPINEPHrine  0.3 mg/0.3 mL IJ SOAJ injection Inject 0.3 mg into the muscle as needed for anaphylaxis.   aspirin  EC 81 MG tablet Take 1 tablet (81 mg total) by mouth daily. Swallow whole. (Patient not taking: Reported on 10/04/2023)   vitamin B-12 (CYANOCOBALAMIN ) 50 MCG tablet Take 50 mcg by mouth daily. (Patient not taking: Reported on 10/20/2023)   No facility-administered encounter medications on file as of 10/24/2023.        No data to display            04/10/2023    2:00 PM  [facility]  Cognitive Assessment   Visuospatial/ Executive (0/5) 5  Naming (0/3) 3  Attention: Read list of digits (0/2) 2  Attention: Read list of letters (0/1) 1  Attention: Serial 7 subtraction starting at 100 (0/3) 3  Language: Repeat phrase (0/2) 2  Language : Fluency (0/1) 1  Abstraction (0/2) 0  Delayed Recall (0/5) 3  Orientation (0/6) 6  Total 26  Adjusted Score (based on education) 26    Objective:     PHYSICAL EXAMINATION:    VITALS:   Vitals:   10/24/23 1514  BP: 138/65  Pulse: 82  Resp: 18  SpO2: 100%  Weight: 198 lb (89.8 kg)  Height: 5\' 3"  (1.6 m)    GEN:  The patient appears stated age and is in NAD. HEENT:  Normocephalic, atraumatic.   Neurological examination:  General: NAD, well-groomed, appears stated age. Orientation: The patient is alert. Oriented to person, place and date Cranial nerves: There is good facial symmetry.The speech is fluent and clear. No aphasia or dysarthria. Fund of knowledge is appropriate. Recent memory impaired, remote memory normal. . Attention and concentration are reduced. Able to name objects and repeat phrases.  Hearing is intact to conversational tone.   Sensation: Sensation is intact to light touch throughout Motor: Strength is at least antigravity x4. DTR's 2/4 in UE/LE     Movement examination: Tone: There is normal tone in the UE/LE Abnormal movements:  no tremor.  No myoclonus.  No asterixis.   Coordination:  There is no decremation with RAM's. Normal finger to nose  Gait and Station: The patient has no difficulty arising out of a deep-seated chair without the use of the hands. The patient's stride length is good.  Gait is cautious and narrow.    Thank you for allowing us  the opportunity to participate in the care of this nice patient. Please do not hesitate to contact us  for any questions or concerns.   Total time spent on today's visit was 30 minutes dedicated to this patient today, preparing to see patient, examining the  patient, ordering tests and/or medications and counseling the patient, documenting clinical information in the EHR or other health record, independently interpreting results and communicating results to the patient/family, discussing treatment and goals, answering patient's questions and coordinating care.  Cc:  Joaquin Mulberry, MD  Tex Filbert 10/24/2023 3:37 PM

## 2023-10-24 NOTE — Patient Instructions (Signed)
 It was a pleasure to see you today at our office.   Recommendations:   Replenish B12  Follow up in 6  months Consider psychotherapy for stress management   For psychiatric meds, mood meds: Please have your primary care physician manage these medications.  If you have any severe symptoms of a stroke, or other severe issues such as confusion,severe chills or fever, etc call 911 or go to the ER as you may need to be evaluated further     For assessment of decision of mental capacity and competency:  Call Dr. Laverne Potter, geriatric psychiatrist at [phone]  Counseling regarding caregiver distress, including caregiver depression, anxiety and issues regarding community resources, adult day care programs, adult living facilities, or memory care questions:  please contact your  Primary Doctor's Social Worker   Whom to call: Memory  decline, memory medications: Call our office [phone]    [email or web address]   RECOMMENDATIONS FOR ALL PATIENTS WITH MEMORY PROBLEMS: 1. Continue to exercise (Recommend 30 minutes of walking everyday, or 3 hours every week) 2. Increase social interactions - continue going to [place] and enjoy social gatherings with friends and family 3. Eat healthy, avoid fried foods and eat more fruits and vegetables 4. Maintain adequate blood pressure, blood sugar, and blood cholesterol level. Reducing the risk of stroke and cardiovascular disease also helps promoting better memory. 5. Avoid stressful situations. Live a simple life and avoid aggravations. Organize your time and prepare for the next day in anticipation. 6. Sleep well, avoid any interruptions of sleep and avoid any distractions in the bedroom that may interfere with adequate sleep quality 7. Avoid sugar, avoid sweets as there is a strong link between excessive sugar intake, diabetes, and cognitive impairment We discussed the Mediterranean diet, which  has been shown to help patients reduce the risk of progressive memory disorders and reduces cardiovascular risk. This includes eating fish, eat fruits and green leafy vegetables, nuts like almonds and hazelnuts, walnuts, and also use olive oil. Avoid fast foods and fried foods as much as possible. Avoid sweets and sugar as sugar use has been linked to worsening of memory function.  There is always a concern of gradual progression of memory problems. If this is the case, then we may need to adjust level of care according to patient needs. Support, both to the patient and caregiver, should then be put into place.      You have been referred for a neuropsychological evaluation (i.e., evaluation of memory and thinking abilities). Please bring someone with you to this appointment if possible, as it is helpful for the doctor to hear from both you and another adult who knows you well. Please bring eyeglasses and hearing aids if you wear them.    The evaluation will take approximately 3 hours and has two parts:   The first part is a clinical interview with the neuropsychologist (Dr. Kitty Perkins or Dr. Annette Barters). During the interview, the neuropsychologist will speak with you and the individual you brought to the appointment.    The second part of the evaluation is testing with the doctor's technician Bernabe Brew or Burdette Carolin). During the testing, the technician will ask you to remember different types of material, solve problems, and answer some questionnaires. Your family member will not be present for this portion of the evaluation.   Please note: We must reserve several hours of the neuropsychologist's time and the psychometrician's time for your evaluation appointment. As such, there is a No-Show fee of $100. If you  are unable to attend any of your appointments, please contact our office as soon as possible to reschedule.      DRIVING: Regarding driving, in patients with progressive memory problems, driving will be  impaired. We advise to have someone else do the driving if trouble finding directions or if minor accidents are reported. Independent driving assessment is available to determine safety of driving.   If you are interested in the driving assessment, you can contact the following:  [facility] in [place] [phone]  [facility] [phone]  [facility] [phone]  [facility] [phone] or [phone]   FALL PRECAUTIONS: Be cautious when walking. Scan the area for obstacles that may increase the risk of trips and falls. When getting up in the mornings, sit up at the edge of the bed for a few minutes before getting out of bed. Consider elevating the bed at the head end to avoid drop of blood pressure when getting up. Walk always in a well-lit room (use night lights in the walls). Avoid area rugs or power cords from appliances in the middle of the walkways. Use a walker or a cane if necessary and consider physical therapy for balance exercise. Get your eyesight checked regularly.  FINANCIAL OVERSIGHT: Supervision, especially oversight when making financial decisions or transactions is also recommended.  HOME SAFETY: Consider the safety of the kitchen when operating appliances like stoves, microwave oven, and blender. Consider having supervision and share cooking responsibilities until no longer able to participate in those. Accidents with firearms and other hazards in the house should be identified and addressed as well.   ABILITY TO BE LEFT ALONE: If patient is unable to contact 911 operator, consider using LifeLine, or when the need is there, arrange for someone to stay with patients. Smoking is a fire hazard, consider supervision or cessation. Risk of wandering should be assessed by caregiver and if detected at any point, supervision and safe proof recommendations should be instituted.  MEDICATION SUPERVISION: Inability to  self-administer medication needs to be constantly addressed. Implement a mechanism to ensure safe administration of the medications.      Mediterranean Diet A Mediterranean diet refers to food and lifestyle choices that are based on the traditions of countries located on the [employer]. This way of eating has been shown to help prevent certain conditions and improve outcomes for people who have chronic diseases, like kidney disease and heart disease. What are tips for following this plan? Lifestyle  Cook and eat meals together with your family, when possible. Drink enough fluid to keep your urine clear or pale yellow. Be physically active every day. This includes: Aerobic exercise like running or swimming. Leisure activities like gardening, walking, or housework. Get 7-8 hours of sleep each night. If recommended by your health care provider, drink red wine in moderation. This means 1 glass a day for nonpregnant women and 2 glasses a day for men. A glass of wine equals 5 oz (150 mL). Reading food labels  Check the serving size of packaged foods. For foods such as rice and pasta, the serving size refers to the amount of cooked product, not dry. Check the total fat in packaged foods. Avoid foods that have saturated fat or trans fats. Check the ingredients list for added sugars, such as corn syrup. Shopping  At the grocery store, buy most of your food from the areas near the walls of the store. This includes: Fresh fruits and vegetables (produce). Grains, beans, nuts, and  seeds. Some of these may be available in unpackaged forms or large amounts (in bulk). Fresh seafood. Poultry and eggs. Low-fat dairy products. Buy whole ingredients instead of prepackaged foods. Buy fresh fruits and vegetables in-season from local farmers markets. Buy frozen fruits and vegetables in resealable bags. If you do not have access to quality fresh seafood, buy precooked frozen shrimp or canned fish, such  as tuna, salmon, or sardines. Buy small amounts of raw or cooked vegetables, salads, or olives from the deli or salad bar at your store. Stock your pantry so you always have certain foods on hand, such as olive oil, canned tuna, canned tomatoes, rice, pasta, and beans. Cooking  Cook foods with extra-virgin olive oil instead of using butter or other vegetable oils. Have meat as a side dish, and have vegetables or grains as your main dish. This means having meat in small portions or adding small amounts of meat to foods like pasta or stew. Use beans or vegetables instead of meat in common dishes like chili or lasagna. Experiment with different cooking methods. Try roasting or broiling vegetables instead of steaming or sauteing them. Add frozen vegetables to soups, stews, pasta, or rice. Add nuts or seeds for added healthy fat at each meal. You can add these to yogurt, salads, or vegetable dishes. Marinate fish or vegetables using olive oil, lemon juice, garlic, and fresh herbs. Meal planning  Plan to eat 1 vegetarian meal one day each week. Try to work up to 2 vegetarian meals, if possible. Eat seafood 2 or more times a week. Have healthy snacks readily available, such as: Vegetable sticks with hummus. Greek yogurt. Fruit and nut trail mix. Eat balanced meals throughout the week. This includes: Fruit: 2-3 servings a day Vegetables: 4-5 servings a day Low-fat dairy: 2 servings a day Fish, poultry, or lean meat: 1 serving a day Beans and legumes: 2 or more servings a week Nuts and seeds: 1-2 servings a day Whole grains: 6-8 servings a day Extra-virgin olive oil: 3-4 servings a day Limit red meat and sweets to only a few servings a month What are my food choices? Mediterranean diet Recommended Grains: Whole-grain pasta. Brown rice. Bulgar wheat. Polenta. Couscous. Whole-wheat bread. Dwyane Glad. Vegetables: Artichokes. Beets. Broccoli. Cabbage. Carrots. Eggplant. Green beans. Chard.  Kale. Spinach. Onions. Leeks. Peas. Squash. Tomatoes. Peppers. Radishes. Fruits: Apples. Apricots. Avocado. Berries. Bananas. Cherries. Dates. Figs. Grapes. Lemons. Melon. Oranges. Peaches. Plums. Pomegranate. Meats and other protein foods: Beans. Almonds. Sunflower seeds. Pine nuts. Peanuts. Cod. Salmon. Scallops. Shrimp. Tuna. Tilapia. Clams. Oysters. Eggs. Dairy: Low-fat milk. Cheese. Greek yogurt. Beverages: Water . Red wine. Herbal tea. Fats and oils: Extra virgin olive oil. Avocado oil. Grape seed oil. Sweets and desserts: [place] yogurt with honey. Baked apples. Poached pears. Trail mix. Seasoning and other foods: Basil. Cilantro. Coriander. Cumin. Mint. Parsley. Sage. Rosemary. Tarragon. Garlic. Oregano. Thyme. Pepper. Balsalmic vinegar. Tahini. Hummus. Tomato sauce. Olives. Mushrooms. Limit these Grains: Prepackaged pasta or rice dishes. Prepackaged cereal with added sugar. Vegetables: Deep fried potatoes (french fries). Fruits: Fruit canned in syrup. Meats and other protein foods: Beef. Pork. Lamb. Poultry with skin. Hot dogs. Helene Loader. Dairy: Ice cream. Sour cream. Whole milk. Beverages: Juice. Sugar-sweetened soft drinks. Beer. Liquor and spirits. Fats and oils: Butter. Canola oil. Vegetable oil. Beef fat (tallow). Lard. Sweets and desserts: Cookies. Cakes. Pies. Candy. Seasoning and other foods: Mayonnaise. Premade sauces and marinades. The items listed may not be a complete list. Talk with your dietitian about what dietary choices  are right for you. Summary The Mediterranean diet includes both food and lifestyle choices. Eat a variety of fresh fruits and vegetables, beans, nuts, seeds, and whole grains. Limit the amount of red meat and sweets that you eat. Talk with your health care provider about whether it is safe for you to drink red wine in moderation. This means 1 glass a day for nonpregnant women and 2 glasses a day for men. A glass of wine equals 5 oz (150 mL). This information  is not intended to replace advice given to you by your health care provider. Make sure you discuss any questions you have with your health care provider. Document Released: 01/07/2016 Document Revised: 02/09/2016 Document Reviewed: 01/07/2016 Elsevier Interactive Patient Education  2017 [employer].

## 2023-10-24 NOTE — Telephone Encounter (Signed)
  Follow up Call-     10/20/2023    2:46 PM  Call back number  Post procedure Call Back phone  # [phone]  Permission to leave phone message Yes     Patient questions:  Do you have a fever, pain , or abdominal swelling? No. Pain Score  0 *  Have you tolerated food without any problems? Yes.    Have you been able to return to your normal activities? Yes.    Do you have any questions about your discharge instructions: Diet   No. Medications  No. Follow up visit  No.  Do you have questions or concerns about your Care? Yes.  Pt is concerned she hasn't moved her bowels since procedure.  I informed pt this is not abnormal and it may take a few days to get back to normal.  Actions: * If pain score is 4 or above: No action needed, pain <4.

## 2023-10-26 ENCOUNTER — Ambulatory Visit: Payer: Self-pay | Admitting: Internal Medicine

## 2023-10-26 LAB — SURGICAL PATHOLOGY

## 2023-10-27 ENCOUNTER — Other Ambulatory Visit: Payer: Self-pay

## 2023-10-27 ENCOUNTER — Ambulatory Visit (HOSPITAL_COMMUNITY)
Admission: EM | Admit: 2023-10-27 | Discharge: 2023-10-27 | Disposition: A | Discharge status: Home / Self Care | Attending: Emergency Medicine | Admitting: Emergency Medicine

## 2023-10-27 ENCOUNTER — Encounter (HOSPITAL_COMMUNITY): Payer: Self-pay

## 2023-10-27 DIAGNOSIS — R051 Acute cough: Secondary | ICD-10-CM

## 2023-10-27 DIAGNOSIS — R519 Headache, unspecified: Secondary | ICD-10-CM | POA: Diagnosis not present

## 2023-10-27 MED ORDER — PREDNISONE 10 MG PO TABS
ORAL_TABLET | ORAL | 0 refills | Status: AC
Start: 2023-10-27 — End: 2023-11-08
  Filled 2023-10-27: qty 42, 12d supply, fill #0

## 2023-10-27 MED ORDER — DEXTROMETHORPHAN HBR 15 MG/5ML PO SYRP
10.0000 mL | ORAL_SOLUTION | Freq: Four times a day (QID) | ORAL | 0 refills | Status: AC | PRN
  Filled 2023-10-27: qty 120, 3d supply, fill #0

## 2023-10-27 NOTE — ED Provider Notes (Signed)
 [facility]    CSN: [id number] Arrival date & time: 10/27/23  1128      History   Chief Complaint No chief complaint on file.   HPI Heather Cook is a 63 y.o. female.   Patient presents today complaining of cough and headache x 3 days.  She states there is a couple that lives in her household that has similar symptoms.  She has been taking over-the-counter Alka-Seltzer with no relief.  Patient denies any shortness of breath no chest pain no fever no nasal congestion.      Past Medical History:  Diagnosis Date   Arthritis    Blood transfusion without reported diagnosis    at age 49   Diabetes mellitus without complication (HCC)    GERD (gastroesophageal reflux disease)    H. pylori infection    History of stomach ulcers    Hyperlipidemia    Sciatica    Stroke [facility])     Patient Active Problem List   Diagnosis Date Noted   Memory impairment 04/10/2023   Stroke-like symptoms 10/07/2022   TIA (transient ischemic attack) 09/29/2022   Acute CVA (cerebrovascular accident) (HCC) 09/29/2022   Controlled type 2 diabetes mellitus without complication, without long-term current use of insulin  (HCC) 09/29/2022   GERD (gastroesophageal reflux disease) 09/29/2022    Past Surgical History:  Procedure Laterality Date   TUBAL LIGATION      OB History     Gravida  3   Para  2   Term  2   Preterm      AB  1   Living         SAB  1   IAB      Ectopic      Multiple      Live Births               Home Medications    Prior to Admission medications   Medication Sig Start Date End Date Taking? Authorizing Provider  dextromethorphan 15 MG/5ML syrup Take 10 mLs (30 mg total) by mouth 4 (four) times daily as needed for cough. 10/27/23  Yes Harden Leyden, NP  predniSONE  (STERAPRED UNI-PAK 21 TAB) 10 MG (21) TBPK tablet Take by mouth daily. Take 6 tabs by mouth daily  for 2 days, then 5 tabs for 2 days, then 4 tabs for 2 days, then 3 tabs for 2  days, 2 tabs for 2 days, then 1 tab by mouth daily for 2 days 10/27/23  Yes Harden Leyden, NP  aspirin  EC 81 MG tablet Take 1 tablet (81 mg total) by mouth daily. Swallow whole. Patient not taking: Reported on 10/04/2023 03/13/23   Newlin, Enobong, MD  atorvastatin  (LIPITOR) 40 MG tablet Take 1 tablet (40 mg total) by mouth daily. 08/21/23   Newlin, Enobong, MD  baclofen  (LIORESAL ) 10 MG tablet Take 1 tablet (10 mg total) by mouth 3 (three) times daily. 06/23/23   Starlene Eaton, FNP  cetirizine  (ZYRTEC ) 10 MG tablet Take 1 tablet (10 mg total) by mouth daily. 01/06/23   Newlin, Enobong, MD  clotrimazole  (LOTRIMIN ) 1 % cream Apply 1 Application topically 2 (two) times daily. 05/15/23   Newlin, Enobong, MD  diclofenac  Sodium (VOLTAREN ) 1 % GEL Apply 2 grams to the affected area(s) topically 2 to 3 times a day. 10/02/23     empagliflozin  (JARDIANCE ) 25 MG TABS tablet Take 1 tablet (25 mg total) by mouth daily. 08/21/23   Newlin, Enobong, MD  EPINEPHrine  0.3 mg/0.3 mL IJ SOAJ injection Inject 0.3 mg into the muscle as needed for anaphylaxis. 10/13/22   Hassie Lint, PA-C  vitamin B-12 (CYANOCOBALAMIN ) 50 MCG tablet Take 50 mcg by mouth daily. Patient not taking: Reported on 10/20/2023    [provider]    Family History Family History  Adopted: Yes  Problem Relation Age of Onset   Dementia Mother    Heart attack Mother    Stomach cancer Sister 48 - 19   Colon cancer Neg Hx    Colon polyps Neg Hx     Social History Social History   Tobacco Use   Smoking status: Never   Smokeless tobacco: Never  Vaping Use   Vaping status: Never Used  Substance Use Topics   Alcohol use: No   Drug use: No     Allergies   Penicillins, Almond oil, Peanuts [peanut oil], Morphine , Morphine  and codeine, Strawberry extract, and Tramadol    Review of Systems Review of Systems  Constitutional:  Negative for chills, fatigue and fever.  HENT:  Positive for sore throat. Negative for  congestion, ear pain, postnasal drip, rhinorrhea, sinus pressure, sinus pain and sneezing.   Respiratory:  Positive for cough. Negative for shortness of breath.   Cardiovascular: Negative.   Gastrointestinal: Negative.   Genitourinary: Negative.   Musculoskeletal: Negative.   Neurological:  Positive for headaches. Negative for dizziness, facial asymmetry and numbness.     Physical Exam Triage Vital Signs ED Triage Vitals [10/27/23 1215]  Encounter Vitals Group     BP 116/70     Systolic BP Percentile      Diastolic BP Percentile      Pulse Rate 76     Resp 20     Temp 98.3 F (36.8 C)     Temp Source Oral     SpO2 95 %     Weight      Height      Head Circumference      Peak Flow      Pain Score 3     Pain Loc      Pain Education      Exclude from Growth Chart    No data found.  Updated Vital Signs BP 116/70 (BP Location: Right Arm)   Pulse 76   Temp 98.3 F (36.8 C) (Oral)   Resp 20   LMP 08/12/2014 (Exact Date)   SpO2 95%   Visual Acuity Right Eye Distance:   Left Eye Distance:   Bilateral Distance:    Right Eye Near:   Left Eye Near:    Bilateral Near:     Physical Exam Constitutional:      Appearance: Normal appearance.  HENT:     Right Ear: Tympanic membrane normal.     Left Ear: Tympanic membrane normal.     Nose: Congestion present.     Mouth/Throat:     Mouth: Mucous membranes are moist.     Pharynx: Oropharynx is clear.  Eyes:     Pupils: Pupils are equal, round, and reactive to light.  Cardiovascular:     Rate and Rhythm: Normal rate.  Pulmonary:     Effort: Pulmonary effort is normal.  Abdominal:     General: Abdomen is flat.  Musculoskeletal:     Cervical back: Normal range of motion.  Neurological:     General: No focal deficit present.     Mental Status: She is alert.      UC Treatments /  Results  Labs (all labs ordered are listed, but only abnormal results are displayed) Labs Reviewed - No data to  display  EKG   Radiology No results found.  Procedures Procedures (including critical care time)  Medications Ordered in UC Medications - No data to display  Initial Impression / Assessment and Plan / UC Course  I have reviewed the triage vital signs and the nursing notes.  Pertinent labs & imaging results that were available during my care of the patient were reviewed by me and considered in my medical decision making (see chart for details).     Discussed with patient symptoms persist with viral illness patient did not feel a chest x-ray or COVID test was warranted today. Patient can take the prescribed medication for cough as needed Continue to take over-the-counter cough and cold medicine as needed avoid decongestants as this may elevate the blood pressure If symptoms persist with a fever greater than 101 and she begins to have shortness of breath or weakness to 1 side of the body she needs to be seen in the emergency room And antibiotic is not needed today expressed that symptoms may persist over several weeks May use a humidifier to help at night  Final Clinical Impressions(s) / UC Diagnoses   Final diagnoses:  Acute cough  Acute nonintractable headache, unspecified headache type     Discharge Instructions      Patient can take the prescribed medication for cough as needed If symptoms persist with a fever greater than 101 and she begins to have shortness of breath or weakness to 1 side of the body she needs to be seen in the emergency room antibiotic is not needed today expressed that symptoms may persist over several weeks May use a humidifier to help at night   ED Prescriptions     Medication Sig Dispense Auth. Provider   dextromethorphan 15 MG/5ML syrup Take 10 mLs (30 mg total) by mouth 4 (four) times daily as needed for cough. 120 mL Horacio Lyell L, NP   predniSONE  (STERAPRED UNI-PAK 21 TAB) 10 MG (21) TBPK tablet Take by mouth daily. Take 6 tabs by  mouth daily  for 2 days, then 5 tabs for 2 days, then 4 tabs for 2 days, then 3 tabs for 2 days, 2 tabs for 2 days, then 1 tab by mouth daily for 2 days 42 tablet Harden Leyden, NP      PDMP not reviewed this encounter.   Harden Leyden, NP 10/27/23 1253

## 2023-10-27 NOTE — Discharge Instructions (Addendum)
 Patient can take the prescribed medication for cough as needed If symptoms persist with a fever greater than 101 and she begins to have shortness of breath or weakness to 1 side of the body she needs to be seen in the emergency room antibiotic is not needed today expressed that symptoms may persist over several weeks May use a humidifier to help at night

## 2023-10-27 NOTE — ED Triage Notes (Signed)
 Pt reports she has had a cough, headache, and sore throat x 3 days   Took cold meds and cough drops but no relief.

## 2023-11-20 ENCOUNTER — Other Ambulatory Visit: Payer: Self-pay | Admitting: Licensed Clinical Social Worker

## 2023-11-20 ENCOUNTER — Other Ambulatory Visit: Payer: Self-pay

## 2023-11-20 NOTE — Patient Outreach (Signed)
 Complex Care Management   Visit Note  11/20/2023  Name:  Heather Cook MRN: [id number] DOB: 1960-12-07  Situation: Referral received for Complex Care Management related to housing needs and related to management of stress issues faced I obtained verbal consent from Patient.  Visit completed with patient   on the phone  Background:   Past Medical History:  Diagnosis Date   Arthritis    Blood transfusion without reported diagnosis    at age 63   Diabetes mellitus without complication (HCC)    GERD (gastroesophageal reflux disease)    H. pylori infection    History of stomach ulcers    Hyperlipidemia    Sciatica    Stroke [facility])     Assessment: Patient Reported Symptoms:  Cognitive Cognitive Status: Alert and oriented to person, place, and time Cognitive/Intellectual Conditions Management [RPT]: None reported or documented in medical history or problem list   Health Maintenance Behaviors: Stress management Health Facilitated by: Rest  Neurological Neurological Review of Symptoms: Headaches, Weakness Neurological Management Strategies: Adequate rest  HEENT HEENT Symptoms Reported: No symptoms reported HEENT Management Strategies: Adequate rest    Cardiovascular Cardiovascular Symptoms Reported: Fatigue Cardiovascular Management Strategies: Adequate rest  Respiratory Respiratory Symptoms Reported: No symptoms reported    Endocrine Patient reports the following symptoms related to hypoglycemia or hyperglycemia : Weakness or fatigue Endocrine Management Strategies: Adequate rest  Gastrointestinal Gastrointestinal Symptoms Reported: No symptoms reported Gastrointestinal Management Strategies: Adequate rest, Coping strategies Nutrition Risk Screen (CP): No indicators present  Genitourinary Genitourinary Symptoms Reported: No symptoms reported Genitourinary Management Strategies: Adequate rest  Integumentary Integumentary Symptoms Reported: No symptoms reported Skin Management  Strategies: Adequate rest, Coping strategies  Musculoskeletal Musculoskelatal Symptoms Reviewed: Weakness, Muscle pain Additional Musculoskeletal Details: injury to left foot; is receiving physical therapy as scheduled Musculoskeletal Conditions: Unsteady gait Musculoskeletal Management Strategies: Adequate rest      Psychosocial Psychosocial Symptoms Reported: Sadness - if selected complete PHQ 2-9, Anxiety - if selected complete GAD Additional Psychological Details: housing issues Behavioral Health Conditions: Depression Behavioral Management Strategies: Adequate rest Major Change/Loss/Stressor/Fears (CP): Medical condition, self Techniques to Cope with Loss/Stress/Change: Counseling Quality of Family Relationships: supportive Do you feel physically threatened by others?: No      11/20/2023    3:05 PM  Depression screen PHQ 2/9  Decreased Interest 1  Down, Depressed, Hopeless 1  PHQ - 2 Score 2  Altered sleeping 1  Tired, decreased energy 1  Change in appetite 1  Feeling bad or failure about yourself  1  Trouble concentrating 1  Moving slowly or fidgety/restless 1  Suicidal thoughts 0  PHQ-9 Score 8  Difficult doing work/chores Somewhat difficult    Vitals:   BP is monitored by PCP of client Medications Reviewed Today     Reviewed by Frances Ozell GORMAN KEN (Social Worker) on 11/20/23 at 1446  Med List Status: <None>   Medication Order Taking? Sig Documenting Provider Last Dose Status Informant  aspirin  EC 81 MG tablet [id number] Not taking  Take 1 tablet (81 mg total) by mouth daily. Swallow whole.  Patient not taking: Reported on 11/20/2023   Newlin, Enobong, MD  Active   atorvastatin  (LIPITOR) 40 MG tablet [id number] Yes Take 1 tablet (40 mg total) by mouth daily. Newlin, Enobong, MD  Active   baclofen  (LIORESAL ) 10 MG tablet [id number] Yes Take 1 tablet (10 mg total) by mouth 3 (three) times daily. Enedelia Dorna HERO, FNP  Active   cetirizine  (ZYRTEC ) 10 MG tablet  [id number] Yes  Take 1 tablet (10 mg total) by mouth daily. Newlin, Enobong, MD  Active   clotrimazole  (LOTRIMIN ) 1 % cream [id number] Yes Apply 1 Application topically 2 (two) times daily. Newlin, Enobong, MD  Active   dextromethorphan  15 MG/5ML syrup [id number] Yes Take 10 mLs (30 mg total) by mouth 4 (four) times daily as needed for cough. Merilee Andrea CROME, NP  Active   diclofenac  Sodium (VOLTAREN ) 1 % GEL [id number] Yes Apply 2 grams to the affected area(s) topically 2 to 3 times a day.   Active   empagliflozin  (JARDIANCE ) 25 MG TABS tablet [id number] Yes Take 1 tablet (25 mg total) by mouth daily. Newlin, Enobong, MD  Active   EPINEPHrine  0.3 mg/0.3 mL IJ SOAJ injection [id number] Yes Inject 0.3 mg into the muscle as needed for anaphylaxis. Danton Slough M, PA-C  Active   vitamin B-12 (CYANOCOBALAMIN ) 50 MCG tablet [id number] Yes Take 50 mcg by mouth daily. [provider]  Active             Recommendation:   PCP Follow-up Continue Current Plan of Care Take medications as prescribed Attend scheduled medical appointments Call LCSW as needed for SW support  Consider legal support as needed to help manage her current needs Allow time for ADLs completion and allow time for rest and relaxation  Follow Up Plan:   Telephone follow up appointment date/time:  12/25/23 at 11:00 AM   Glendia Pear  MSW, LCSW [place]/Value Based Care [facility] Licensed Clinical Social Worker Direct Dial:  [phone] Fax:  [phone] Website:  [email or web address]

## 2023-11-20 NOTE — Patient Instructions (Signed)
 Visit Information  Thank you for taking time to visit with me today. Please don't hesitate to contact me if I can be of assistance to you before our next scheduled appointment.  Our next appointment is by telephone on 12/25/23 at 11:00 AM   Please call the care guide team at [phone] if you need to cancel or reschedule your appointment.   Following is a copy of your care plan:   Goals Addressed             This Visit's Progress    VBCI Social Work Care Plan       Problems:   Housing issues; seeking repairs to her home              Financial challenges: not working due to injury on the job             Family support (sister is in [place] ).              Job Stress issues (she experienced a fall while at work and has been recovering from fall at work. Receives physical therapy weekly as scheduled)            Reduced family support            Car may need repairs (not always reliable for transport)             Anxiety and stress issues  CSW Clinical Goal(s):   Over the next 30 days the Patient will attend scheduled medical appointments AEB patient report and documentation in EPIC record.              Over next 30 days the Patient will work with [employer] of house where she has applied to live to Marketing executive plans for client AEB client report of house nearing readiness for occupancy  Interventions:  Discussed client needs and status             Client has put deposit on home in area and is waiting for final inspections on repairs to be able to move into home.  Discussed housing needs with client             Provided counseling support for client              Active Listening to allow client to share her needs and concerns             Discussed medication procurement              Informed client previously  of [facility] in [place], [place] for counseling support. Informed client of [facility] in [place],  [place]) for counseling support, outpatient. She said she was familiar with that agency and had a relative that had received support at that agency               Discussed pain issues of client left foot. Discussed sleeping issues. Discussed financial stressors.Discussed support from church friends               Discussed food pantries such as [employer] support. Discussed food support with produce with local [employer]               Informed of program support with RN, LCSW, Pharmacist              Discussed client transport needs  Client discussed whether or not she should retain an attorney to help her with her worker's compensation case               Encouraged client to call LCSW for SW support at [phone]               [facility] client for phone call with LCSW today  Patient Goals/Self-Care Activities:  Attend scheduled medical appointments            Take medications as prescribed            Communicate often with landlord of home where she wants to reside soon to inquire about repairs needed at home and when needed inspections will be completed              Practice self care with getting adequate rest, eating meals on schedule and following daily routine              Call LCSW as needed for SW support              Use coping skills to manage stress or anxiety issues faced   Plan:   LCSW to call client on 12/25/23 at 11:00 AM         Please go to [facility] [address], [place] [phone]) if you are experiencing a Mental Health or Behavioral Health Crisis or need someone to talk to.  The patient verbalized understanding of instructions, educational materials, and care plan provided today and DECLINED offer to receive copy of patient instructions, educational materials, and care plan.     Heather Cook  MSW, LCSW [place]/Value Based Care [facility] Licensed Clinical Social Worker Direct  Dial:  [phone] Fax:  [phone] Website:  [email or web address]

## 2023-12-25 ENCOUNTER — Other Ambulatory Visit: Payer: Self-pay | Admitting: Licensed Clinical Social Worker

## 2023-12-25 ENCOUNTER — Other Ambulatory Visit: Payer: Self-pay

## 2023-12-25 NOTE — Patient Outreach (Signed)
 Complex Care Management   Visit Note  12/25/2023  Name:  Heather Cook MRN: [id number] DOB: April 28, 1961  Situation: Referral received for Complex Care Management related to housing needs; anxiety issues I obtained verbal consent from Patient.  Visit completed with patient  on the phone  Background:   Past Medical History:  Diagnosis Date   Arthritis    Blood transfusion without reported diagnosis    at age 63   Diabetes mellitus without complication (HCC)    GERD (gastroesophageal reflux disease)    H. pylori infection    History of stomach ulcers    Hyperlipidemia    Sciatica    Stroke [facility])     Assessment: Patient Reported Symptoms:  Cognitive Cognitive Status: Alert and oriented to person, place, and time Cognitive/Intellectual Conditions Management [RPT]: None reported or documented in medical history or problem list   Health Maintenance Behaviors: Sleep adequate Health Facilitated by: Rest  Neurological Neurological Review of Symptoms: Headaches, Weakness Neurological Management Strategies: Adequate rest  HEENT HEENT Symptoms Reported: No symptoms reported HEENT Management Strategies: Adequate rest    Cardiovascular Cardiovascular Symptoms Reported: Fatigue Cardiovascular Management Strategies: Adequate rest  Respiratory Respiratory Symptoms Reported: No symptoms reported Respiratory Management Strategies: Adequate rest, Coping strategies  Endocrine Endocrine Symptoms Reported: Weakness or fatigue Is patient diabetic?: Yes    Gastrointestinal Gastrointestinal Symptoms Reported: No symptoms reported Gastrointestinal Management Strategies: Adequate rest, Coping strategies    Genitourinary Genitourinary Symptoms Reported: Frequency Genitourinary Management Strategies: Adequate rest  Integumentary Integumentary Symptoms Reported: No symptoms reported Skin Management Strategies: Adequate rest  Musculoskeletal Musculoskelatal Symptoms Reviewed: Muscle pain, Unsteady gait,  Weakness Musculoskeletal Management Strategies: Adequate rest  Receives Outpatient physical therapy one time weekly    Psychosocial Psychosocial Symptoms Reported: Sadness - if selected complete PHQ 2-9, Depression - if selected complete PHQ 2-9 Behavioral Management Strategies: Coping strategies Major Change/Loss/Stressor/Fears (CP): Medical condition, self Techniques to Cope with Loss/Stress/Change: Counseling        12/25/2023   11:59 AM  Depression screen PHQ 2/9  Decreased Interest 1  Down, Depressed, Hopeless 1  PHQ - 2 Score 2  Altered sleeping 1  Tired, decreased energy 1  Change in appetite 1  Feeling bad or failure about yourself  1  Trouble concentrating 1  Moving slowly or fidgety/restless 1  Suicidal thoughts 0  PHQ-9 Score 8  Difficult doing work/chores Somewhat difficult    Vitals:  BP Within normal range, per client  Medications Reviewed Today     Reviewed by Frances Ozell GORMAN KEN (Social Worker) on 12/25/23 at 1151  Med List Status: <None>   Medication Order Taking? Sig Documenting Provider Last Dose Status Informant  aspirin  EC 81 MG tablet [id number] Not taking  Take 1 tablet (81 mg total) by mouth daily. Swallow whole.  Patient not taking: Reported on 12/25/2023   Newlin, Enobong, MD  Active   atorvastatin  (LIPITOR) 40 MG tablet [id number] Yes Take 1 tablet (40 mg total) by mouth daily. Newlin, Enobong, MD  Active   baclofen  (LIORESAL ) 10 MG tablet [id number] Yes Take 1 tablet (10 mg total) by mouth 3 (three) times daily. Enedelia Dorna HERO, FNP  Active   cetirizine  (ZYRTEC ) 10 MG tablet [id number] Yes Take 1 tablet (10 mg total) by mouth daily. Newlin, Enobong, MD  Active   clotrimazole  (LOTRIMIN ) 1 % cream [id number] Yes Apply 1 Application topically 2 (two) times daily. Newlin, Enobong, MD  Active   dextromethorphan  15 MG/5ML syrup [id number] Yes Take 10 mLs (30 mg  total) by mouth 4 (four) times daily as needed for cough. Merilee Andrea CROME, NP  Active    diclofenac  Sodium (VOLTAREN ) 1 % GEL [id number] Yes Apply 2 grams to the affected area(s) topically 2 to 3 times a day.   Active   empagliflozin  (JARDIANCE ) 25 MG TABS tablet [id number] Yes Take 1 tablet (25 mg total) by mouth daily. Newlin, Enobong, MD  Active   EPINEPHrine  0.3 mg/0.3 mL IJ SOAJ injection [id number] Yes Inject 0.3 mg into the muscle as needed for anaphylaxis. Danton Slough M, PA-C  Active   vitamin B-12 (CYANOCOBALAMIN ) 50 MCG tablet [id number] Yes Take 50 mcg by mouth daily. [provider]  Active             Recommendation:   PCP Follow-up Continue Current Plan of Care Call HR rep at her job to discuss worker's compensation claim for client Call LCSW as needed for SW support  Follow Up Plan:   Telephone follow up appointment date/time:  01/31/24 at 10:00 AM   Glendia Pear  MSW, LCSW [place]/Value Based Care [facility] Licensed Clinical Social Worker Direct Dial:  [phone] Fax:  [phone] Website:  [email or web address]

## 2023-12-25 NOTE — Patient Instructions (Signed)
 Visit Information  Thank you for taking time to visit with me today. Please don't hesitate to contact me if I can be of assistance to you before our next scheduled appointment.  Our next appointment is by telephone on 01/31/24 at 10:00 AM   Please call the care guide team at [phone] if you need to cancel or reschedule your appointment.   Following is a copy of your care plan:   Goals Addressed             This Visit's Progress    VBCI Social Work Care Plan       Problems:   Housing issues; seeking repairs to her home              Financial challenges: not working due to injury on the job             Family support (sister is in [place] ).              Job Stress issues (she experienced a fall while at work and has been recovering from fall at work. Receives physical therapy weekly as scheduled)            Reduced family support            Car may need repairs (not always reliable for transport)             Anxiety and stress issues            Ambulation challenges  CSW Clinical Goal(s):   Over the next 30 days the Patient will attend scheduled medical appointments AEB patient report and documentation in EPIC record.              Over next 30 days the Patient will work with [employer] of house where she has applied to live to Marketing executive plans for client AEB client report of house nearing readiness for occupancy  Interventions:  Discussed client needs and status             Discussed housing needs with client             Provided counseling support for client              Active Listening to allow client to share her needs and concerns             Discussed medication procurement of client              Informed client previously  of [facility] in [place], [place] for counseling support. Informed client of [facility] in [place], [place]) for counseling support, outpatient. She said she was familiar with that  agency and had a relative that had received support at that agency               Discussed pain issues of client left foot. Discussed sleeping issues. Discussed financial stressors.Discussed support from church friends               Discussed client transport needs               Client said she had been injured on the job and she plans to call HR at her job to discuss workers compensation claim for client. She said she completed paperwork for such a claim at her job               Discussed physical therapy sessions. Client said she  goes to Outpatient Physical Therapy session one time weekly.              Encouraged client to call LCSW for SW support at [phone]               [facility] client for phone call with LCSW today  Patient Goals/Self-Care Activities:  Attend scheduled medical appointments            Take medications as prescribed            Communicate often with landlord of home where she wants to reside soon to inquire about repairs needed at home and when needed inspections will be completed              Practice self care with getting adequate rest, eating meals on schedule and following daily routine              Call LCSW as needed for SW support              Call HR rep at her job to discuss worker's compensation claim for client              Use coping skills to manage stress or anxiety issues faced   Plan:   LCSW to call client on 01/31/24 at 10:00 AM         Please go to [facility] [address] [place] [phone]) if you are experiencing a Mental Health or Behavioral Health Crisis or need someone to talk to.  The patient verbalized understanding of instructions, educational materials, and care plan provided today and DECLINED offer to receive copy of patient instructions, educational materials, and care plan.    Glendia Pear  MSW, LCSW [facility] Licensed Clinical Social  Worker Direct Dial:  [phone] Fax:  [phone] Website:  [email or web address]

## 2024-01-30 ENCOUNTER — Other Ambulatory Visit: Payer: Self-pay | Admitting: Licensed Clinical Social Worker

## 2024-02-20 ENCOUNTER — Encounter: Payer: Self-pay | Admitting: Family Medicine

## 2024-02-20 ENCOUNTER — Ambulatory Visit: Payer: Self-pay | Attending: Family Medicine | Admitting: Family Medicine

## 2024-02-20 ENCOUNTER — Other Ambulatory Visit: Payer: Self-pay

## 2024-02-20 VITALS — BP 148/80 | HR 89 | Ht 63.0 in | Wt 204.6 lb

## 2024-02-20 DIAGNOSIS — E669 Obesity, unspecified: Secondary | ICD-10-CM

## 2024-02-20 DIAGNOSIS — E1159 Type 2 diabetes mellitus with other circulatory complications: Secondary | ICD-10-CM | POA: Diagnosis not present

## 2024-02-20 DIAGNOSIS — E1169 Type 2 diabetes mellitus with other specified complication: Secondary | ICD-10-CM

## 2024-02-20 DIAGNOSIS — E1165 Type 2 diabetes mellitus with hyperglycemia: Secondary | ICD-10-CM

## 2024-02-20 DIAGNOSIS — Z8673 Personal history of transient ischemic attack (TIA), and cerebral infarction without residual deficits: Secondary | ICD-10-CM

## 2024-02-20 DIAGNOSIS — Z7984 Long term (current) use of oral hypoglycemic drugs: Secondary | ICD-10-CM

## 2024-02-20 DIAGNOSIS — F330 Major depressive disorder, recurrent, mild: Secondary | ICD-10-CM

## 2024-02-20 DIAGNOSIS — E785 Hyperlipidemia, unspecified: Secondary | ICD-10-CM | POA: Diagnosis not present

## 2024-02-20 DIAGNOSIS — T7840XD Allergy, unspecified, subsequent encounter: Secondary | ICD-10-CM

## 2024-02-20 LAB — POCT GLYCOSYLATED HEMOGLOBIN (HGB A1C): HbA1c, POC (controlled diabetic range): 9.4 % — AB (ref 0.0–7.0)

## 2024-02-20 LAB — GLUCOSE, POCT (MANUAL RESULT ENTRY): POC Glucose: 273 mg/dL — AB (ref 70–99)

## 2024-02-20 MED ORDER — ATORVASTATIN CALCIUM 40 MG PO TABS
40.0000 mg | ORAL_TABLET | Freq: Every day | ORAL | 1 refills | Status: AC
Start: 2024-02-20 — End: ?
  Filled 2024-02-20: qty 30, 30d supply, fill #0
  Filled 2024-04-11: qty 30, 30d supply, fill #1
  Filled 2024-05-14: qty 30, 30d supply, fill #2

## 2024-02-20 MED ORDER — EMPAGLIFLOZIN 25 MG PO TABS
25.0000 mg | ORAL_TABLET | Freq: Every day | ORAL | 1 refills | Status: AC
  Filled 2024-02-20: qty 30, 30d supply, fill #0

## 2024-02-20 MED ORDER — GLIPIZIDE ER 5 MG PO TB24
5.0000 mg | ORAL_TABLET | Freq: Every day | ORAL | 1 refills | Status: AC
  Filled 2024-02-20: qty 30, 30d supply, fill #0
  Filled 2024-04-11: qty 30, 30d supply, fill #1
  Filled 2024-05-14: qty 30, 30d supply, fill #2

## 2024-02-20 MED ORDER — CETIRIZINE HCL 10 MG PO TABS
10.0000 mg | ORAL_TABLET | Freq: Every day | ORAL | 0 refills | Status: AC
  Filled 2024-02-20: qty 30, 30d supply, fill #0

## 2024-02-20 NOTE — Progress Notes (Signed)
 Subjective:  Patient ID: Heather Cook, female    DOB: 1961-04-28  Age: 63 y.o. MRN: [id number]  CC: Medical Management of Chronic Issues (Depression/)     Discussed the use of AI scribe software for clinical note transcription with the patient, who gave verbal consent to proceed.  History of Present Illness Heather Cook is a 63 year old female with a history of GERD, TIA, type 2 diabetes mellitus  who presents for follow-up regarding elevated blood sugar levels and weight gain.  She has gained 14 pounds since March, and her A1c has increased from 7.8 to 9.4. She has increased her consumption of sweets due to financial constraints. She is currently taking Jardiance  for diabetes management. She previously tried metformin, which caused gastrointestinal discomfort. She does not monitor her blood sugar at home due to issues with her glucose meter.  She experiences depression and receives monthly counseling sessions, which she finds helpful but insufficient in frequency. She has declined medication for depression, preferring to address situational factors.  She has a history of elevated blood pressure, but recent checks have been normal at home. She undergoes therapy three times a week for left ankle sprain, where her blood pressure is checked. She describes foot pain that worsens with walking, which has been a barrier to exercise. She participates in work conditioning therapy three times a week, which includes exercises that exacerbate her foot pain. She has an upcoming appointment with orthopedics.  She has experienced issues with her Medicaid coverage, which reportedly stopped in September.    Past Medical History:  Diagnosis Date   Arthritis    Blood transfusion without reported diagnosis    at age 46   Diabetes mellitus without complication (HCC)    GERD (gastroesophageal reflux disease)    H. pylori infection    History of stomach ulcers    Hyperlipidemia    Sciatica    Stroke [facility])      Past Surgical History:  Procedure Laterality Date   TUBAL LIGATION      Family History  Adopted: Yes  Problem Relation Age of Onset   Dementia Mother    Heart attack Mother    Stomach cancer Sister 16 - 10   Colon cancer Neg Hx    Colon polyps Neg Hx     Social History   Socioeconomic History   Marital status: Single    Spouse name: Not on file   Number of children: Not on file   Years of education: Not on file   Highest education level: Not on file  Occupational History   Not on file  Tobacco Use   Smoking status: Never   Smokeless tobacco: Never  Vaping Use   Vaping status: Never Used  Substance and Sexual Activity   Alcohol use: No   Drug use: No   Sexual activity: Not Currently  Other Topics Concern   Not on file  Social History Narrative   Right Handed    Lives in apartment on the 3rd floor. One story floor plan   Social Drivers of Health   Financial Resource Strain: High Risk (05/15/2023)   Overall Financial Resource Strain (CARDIA)    Difficulty of Paying Living Expenses: Very hard  Food Insecurity: Food Insecurity Present (12/25/2023)   Hunger Vital Sign    Worried About Running Out of Food in the Last Year: Sometimes true    Ran Out of Food in the Last Year: Sometimes true  Transportation Needs: No Transportation Needs (11/20/2023)  PRAPARE - Administrator, Civil Service (Medical): No    Lack of Transportation (Non-Medical): No  Physical Activity: Inactive (11/20/2023)   Exercise Vital Sign    Days of Exercise per Week: 0 days    Minutes of Exercise per Session: 0 min  Stress: Stress Concern Present (12/25/2023)   [employer] of Occupational Health - Occupational Stress Questionnaire    Feeling of Stress: Rather much  Social Connections: Moderately Integrated (03/14/2023)   Social Connection and Isolation Panel    Frequency of Communication with Friends and Family: Once a week    Frequency of Social Gatherings with Friends  and Family: More than three times a week    Attends Religious Services: 1 to 4 times per year    Active Member of [employer] or Organizations: Yes    Attends Banker Meetings: 1 to 4 times per year    Marital Status: Never married    Allergies  Allergen Reactions   Penicillins Swelling    Has patient had a PCN reaction causing immediate rash, facial/tongue/throat swelling, SOB or lightheadedness with hypotension: Yes Has patient had a PCN reaction causing severe rash involving mucus membranes or skin necrosis: No Has patient had a PCN reaction that required hospitalization: No Has patient had a PCN reaction occurring within the last 10 years: No If all of the above answers are NO, then may proceed with Cephalosporin use.    Childhood allergy.   Almond Oil Swelling   Peanuts [Peanut Oil] Swelling    Facial swelling   Morphine  Other (See Comments)    Other Reaction(s): GI Intolerance   Morphine  And Codeine Nausea And Vomiting   Strawberry Extract Other (See Comments)    Dizziness.    Tramadol  Nausea Only    Outpatient Medications Prior to Visit  Medication Sig Dispense Refill   baclofen  (LIORESAL ) 10 MG tablet Take 1 tablet (10 mg total) by mouth 3 (three) times daily. 30 each 0   clotrimazole  (LOTRIMIN ) 1 % cream Apply 1 Application topically 2 (two) times daily. 30 g 0   dextromethorphan  15 MG/5ML syrup Take 10 mLs (30 mg total) by mouth 4 (four) times daily as needed for cough. 120 mL 0   diclofenac  Sodium (VOLTAREN ) 1 % GEL Apply 2 grams to the affected area(s) topically 2 to 3 times a day. 100 g 0   EPINEPHrine  0.3 mg/0.3 mL IJ SOAJ injection Inject 0.3 mg into the muscle as needed for anaphylaxis. 2 each 1   vitamin B-12 (CYANOCOBALAMIN ) 50 MCG tablet Take 50 mcg by mouth daily.     atorvastatin  (LIPITOR) 40 MG tablet Take 1 tablet (40 mg total) by mouth daily. 90 tablet 1   cetirizine  (ZYRTEC ) 10 MG tablet Take 1 tablet (10 mg total) by mouth daily. 30 tablet 0    empagliflozin  (JARDIANCE ) 25 MG TABS tablet Take 1 tablet (25 mg total) by mouth daily. 90 tablet 1   aspirin  EC 81 MG tablet Take 1 tablet (81 mg total) by mouth daily. Swallow whole. (Patient not taking: Reported on 02/20/2024) 90 tablet 1   No facility-administered medications prior to visit.     ROS Review of Systems  Constitutional:  Negative for activity change and appetite change.  HENT:  Negative for sinus pressure and sore throat.   Respiratory:  Negative for chest tightness, shortness of breath and wheezing.   Cardiovascular:  Negative for chest pain and palpitations.  Gastrointestinal:  Negative for abdominal distention, abdominal pain and  constipation.  Genitourinary: Negative.   Musculoskeletal: Negative.   Psychiatric/Behavioral:  Positive for dysphoric mood. Negative for behavioral problems.     Objective:  BP (!) 148/80   Pulse 89   Ht 5' 3 (1.6 m)   Wt 204 lb 9.6 oz (92.8 kg)   LMP 08/12/2014 (Exact Date)   SpO2 97%   BMI 36.24 kg/m      02/20/2024   10:35 AM 02/20/2024    9:51 AM 12/25/2023   11:56 AM  BP/Weight  Systolic BP 148 141 --  Diastolic BP 80 83 --  Wt. (Lbs)  204.6   BMI  36.24 kg/m2     Wt Readings from Last 3 Encounters:  02/20/24 204 lb 9.6 oz (92.8 kg)  10/24/23 198 lb (89.8 kg)  10/20/23 198 lb (89.8 kg)     Physical Exam Constitutional:      Appearance: She is well-developed.  Cardiovascular:     Rate and Rhythm: Normal rate.     Heart sounds: Normal heart sounds. No murmur heard. Pulmonary:     Effort: Pulmonary effort is normal.     Breath sounds: Normal breath sounds. No wheezing or rales.  Chest:     Chest wall: No tenderness.  Abdominal:     General: Bowel sounds are normal. There is no distension.     Palpations: Abdomen is soft. There is no mass.     Tenderness: There is no abdominal tenderness.  Musculoskeletal:        General: Normal range of motion.     Right lower leg: No edema.     Left lower leg: No  edema.  Neurological:     Mental Status: She is alert and oriented to person, place, and time.  Psychiatric:        Mood and Affect: Mood normal.        Latest Ref Rng & Units 08/21/2023   12:11 PM 03/13/2023    4:25 PM 10/13/2022    6:28 PM  CMP  Glucose 70 - 99 mg/dL 854  876  850   BUN 8 - 27 mg/dL 10  9  10    Creatinine 0.57 - 1.00 mg/dL 9.22  9.24  8.93   Sodium 134 - 144 mmol/L 141  142  138   Potassium 3.5 - 5.2 mmol/L 4.9  4.2  3.5   Chloride 96 - 106 mmol/L 101  104  102   CO2 20 - 29 mmol/L 24  22  25    Calcium  8.7 - 10.3 mg/dL 89.8  9.6  9.6   Total Protein 6.0 - 8.5 g/dL 8.0  7.3    Total Bilirubin 0.0 - 1.2 mg/dL 0.3  0.3    Alkaline Phos 44 - 121 IU/L 105  105    AST 0 - 40 IU/L 14  20    ALT 0 - 32 IU/L 14  17      Lipid Panel     Component Value Date/Time   CHOL 225 (H) 08/21/2023 1211   TRIG 107 08/21/2023 1211   HDL 57 08/21/2023 1211   CHOLHDL 4.6 09/30/2022 0941   VLDL 19 09/30/2022 0941   LDLCALC 149 (H) 08/21/2023 1211    CBC    Component Value Date/Time   WBC 7.9 10/13/2022 1828   RBC 5.20 (H) 10/13/2022 1828   HGB 13.6 10/13/2022 1828   HCT 43.9 10/13/2022 1828   PLT 267 10/13/2022 1828   MCV 84.4 10/13/2022 1828   MCH 26.2  10/13/2022 1828   MCHC 31.0 10/13/2022 1828   RDW 13.3 10/13/2022 1828   LYMPHSABS 2.1 10/13/2022 1828   MONOABS 0.5 10/13/2022 1828   EOSABS 0.2 10/13/2022 1828   BASOSABS 0.0 10/13/2022 1828    Lab Results  Component Value Date   HGBA1C 9.4 (A) 02/20/2024    Lab Results  Component Value Date   HGBA1C 9.4 (A) 02/20/2024   HGBA1C 7.8 (A) 08/21/2023   HGBA1C 7.6 (A) 03/13/2023       Assessment & Plan Type 2 diabetes mellitus with hyperglycemia A1c increased to 9.4, indicating poor glycemic control. Jardiance  insufficient. Metformin intolerance due to gastrointestinal side effects. Glipizide  considered with hypoglycemia risk. - Initiate glipizide  5 mg daily with breakfast. - Instruct to bring glucose  meter to clinic for demonstration and ensure it is functioning. - Check blood glucose level in clinic to determine appropriate glipizide  dose -diabetes 273 (random blood glucose) - Educate on signs of hypoglycemia and importance of monitoring blood glucose at home. -Counseled on Diabetic diet, the healthy plate, 849 minutes of moderate intensity exercise/week Blood sugar logs with fasting goals of 80-120 mg/dl, random of less than 819 and in the event of sugars less than 60 mg/dl or greater than 599 mg/dl encouraged to notify the clinic. Advised on the need for annual eye exams, annual foot exams, Pneumonia vaccine.   Obesity Weight gain of 14 pounds since March, contributing to poor glycemic control. Advised to work on reducing caloric intake  Hypertension associated with type 2 diabetes mellitus Blood pressure slightly elevated at 148/80. - Recheck blood pressure at next visit. - No regimen changes today as she states blood pressures at home have been controlled  Depression Ongoing depression, receiving monthly counseling. Declines medication, prefers addressing situational factors. - Continue monthly counseling sessions. - Encourage discussion with therapist about increasing frequency of counseling sessions.   Hyperlipidemia LDL of 149 above goal of less than 70 Will need to check lipid panel and adjust regimen if still above goal Continue statin   History of TIA Secondary risk factor modification Blood pressure, glycemic control and lipid control are imperative    Meds ordered this encounter  Medications   atorvastatin  (LIPITOR) 40 MG tablet    Sig: Take 1 tablet (40 mg total) by mouth daily.    Dispense:  90 tablet    Refill:  1   cetirizine  (ZYRTEC ) 10 MG tablet    Sig: Take 1 tablet (10 mg total) by mouth daily.    Dispense:  30 tablet    Refill:  0   glipiZIDE  (GLUCOTROL  XL) 5 MG 24 hr tablet    Sig: Take 1 tablet (5 mg total) by mouth daily with breakfast.     Dispense:  90 tablet    Refill:  1   empagliflozin  (JARDIANCE ) 25 MG TABS tablet    Sig: Take 1 tablet (25 mg total) by mouth daily.    Dispense:  90 tablet    Refill:  1    Dose increase    Follow-up: Return in about 3 months (around 05/21/2024) for Chronic medical conditions.       Corrina Sabin, MD, FAAFP. [facility] [place], [place] [phone]   02/20/2024, 12:41 PM

## 2024-02-20 NOTE — Patient Instructions (Signed)
 VISIT SUMMARY:  Today, we discussed your elevated blood sugar levels and weight gain. We reviewed your current medications and made some adjustments to better manage your diabetes. We also talked about your foot pain, depression, and blood pressure. Additionally, we addressed your recent weight gain and its impact on your health.  YOUR PLAN:  -TYPE 2 DIABETES MELLITUS WITH HYPERGLYCEMIA: Your blood sugar levels are higher than desired, and your A1c has increased to 9.4. We will start you on a new medication, glipizide , at 5 mg daily with breakfast. Please bring your glucose meter to the clinic so we can ensure it is working properly and show you how to use it. We will also check your blood sugar level in the clinic to determine the right dose for you. It's important to monitor your blood sugar at home and be aware of the signs of low blood sugar.  -OBESITY: You have gained 14 pounds since March, which is affecting your blood sugar control. We will work together to address this through diet and exercise.  -ELEVATED BLOOD PRESSURE: Your blood pressure was slightly elevated at 148/80. We will recheck it at your next visit to ensure it is under control.  -FOOT PAIN: You are experiencing significant foot pain that worsens with walking, which is affecting your ability to exercise. You are currently undergoing work conditioning therapy and have an upcoming appointment with orthopedics to address this issue.  -EDEMA: You have mild swelling, which may be related to dietary changes and stress. We will monitor this condition.  -DEPRESSION: You are experiencing ongoing depression and currently receive monthly counseling sessions. You have chosen not to take medication for depression. Please consider discussing with your therapist about increasing the frequency of your counseling sessions.  INSTRUCTIONS:  Please bring your glucose meter to your next clinic visit for a demonstration. We will recheck your blood  pressure at your next visit. Continue with your monthly counseling sessions and discuss the possibility of increasing their frequency with your therapist. Follow up with orthopedics for your foot pain as scheduled.

## 2024-02-21 ENCOUNTER — Ambulatory Visit: Payer: Self-pay | Admitting: Family Medicine

## 2024-02-21 LAB — CMP14+EGFR
ALT: 19 IU/L (ref 0–32)
AST: 16 IU/L (ref 0–40)
Albumin: 4.1 g/dL (ref 3.9–4.9)
Alkaline Phosphatase: 103 IU/L (ref 49–135)
BUN/Creatinine Ratio: 13 (ref 12–28)
BUN: 12 mg/dL (ref 8–27)
Bilirubin Total: <0.2 mg/dL (ref 0.0–1.2)
CO2: 23 mmol/L (ref 20–29)
Calcium: 9.7 mg/dL (ref 8.7–10.3)
Chloride: 101 mmol/L (ref 96–106)
Creatinine, Ser: 0.9 mg/dL (ref 0.57–1.00)
Globulin, Total: 3.2 g/dL (ref 1.5–4.5)
Glucose: 251 mg/dL — ABNORMAL HIGH (ref 70–99)
Potassium: 4.5 mmol/L (ref 3.5–5.2)
Sodium: 139 mmol/L (ref 134–144)
Total Protein: 7.3 g/dL (ref 6.0–8.5)
eGFR: 72 mL/min/1.73 (ref >59)

## 2024-02-26 ENCOUNTER — Other Ambulatory Visit: Payer: Self-pay

## 2024-02-27 ENCOUNTER — Other Ambulatory Visit: Payer: Self-pay

## 2024-02-28 ENCOUNTER — Other Ambulatory Visit: Payer: Self-pay

## 2024-02-29 ENCOUNTER — Other Ambulatory Visit: Payer: Self-pay

## 2024-03-02 ENCOUNTER — Ambulatory Visit (HOSPITAL_COMMUNITY): Admission: EM | Admit: 2024-03-02 | Discharge: 2024-03-02 | Disposition: A | Discharge status: Home / Self Care

## 2024-03-02 ENCOUNTER — Encounter (HOSPITAL_COMMUNITY): Payer: Self-pay

## 2024-03-02 DIAGNOSIS — M1712 Unilateral primary osteoarthritis, left knee: Secondary | ICD-10-CM

## 2024-03-02 MED ORDER — ACETAMINOPHEN 325 MG PO TABS
975.0000 mg | ORAL_TABLET | Freq: Once | ORAL | Status: AC
  Administered 2024-03-02: 975 mg via ORAL

## 2024-03-02 MED ORDER — ACETAMINOPHEN 325 MG PO TABS
ORAL_TABLET | ORAL | Status: AC
  Filled 2024-03-02: qty 3

## 2024-03-02 NOTE — ED Provider Notes (Signed)
 [facility]    CSN: [id number] Arrival date & time: 03/02/24  1129      History   Chief Complaint No chief complaint on file.   HPI Heather Cook is a 63 y.o. female.   Patient with history of arthritis presents today due to 10 out of 10 left knee pain that started on Thursday after riding a bike at physical therapy.  Patient denies use of any medication for her symptoms.  Patient states that her pain is worse with bearing weight and bending motions, like walking upstairs.  The history is provided by the patient.    Past Medical History:  Diagnosis Date   Arthritis    Blood transfusion without reported diagnosis    at age 57   Diabetes mellitus without complication (HCC)    GERD (gastroesophageal reflux disease)    H. pylori infection    History of stomach ulcers    Hyperlipidemia    Sciatica    Stroke [facility])     Patient Active Problem List   Diagnosis Date Noted   Memory impairment 04/10/2023   Stroke-like symptoms 10/07/2022   TIA (transient ischemic attack) 09/29/2022   Acute CVA (cerebrovascular accident) (HCC) 09/29/2022   Controlled type 2 diabetes mellitus without complication, without long-term current use of insulin  (HCC) 09/29/2022   GERD (gastroesophageal reflux disease) 09/29/2022    Past Surgical History:  Procedure Laterality Date   TUBAL LIGATION      OB History     Gravida  3   Para  2   Term  2   Preterm      AB  1   Living         SAB  1   IAB      Ectopic      Multiple      Live Births               Home Medications    Prior to Admission medications   Medication Sig Start Date End Date Taking? Authorizing Provider  aspirin  EC 81 MG tablet Take 1 tablet (81 mg total) by mouth daily. Swallow whole. 03/13/23  Yes Newlin, Enobong, MD  atorvastatin  (LIPITOR) 40 MG tablet Take 1 tablet (40 mg total) by mouth daily. 02/20/24  Yes Newlin, Enobong, MD  baclofen  (LIORESAL ) 10 MG tablet Take 1 tablet (10 mg total)  by mouth 3 (three) times daily. 06/23/23  Yes Enedelia Dorna HERO, FNP  cetirizine  (ZYRTEC ) 10 MG tablet Take 1 tablet (10 mg total) by mouth daily. 02/20/24  Yes Newlin, Enobong, MD  clotrimazole  (LOTRIMIN ) 1 % cream Apply 1 Application topically 2 (two) times daily. 05/15/23  Yes Newlin, Enobong, MD  diclofenac  Sodium (VOLTAREN ) 1 % GEL Apply 2 grams to the affected area(s) topically 2 to 3 times a day. 10/02/23  Yes   empagliflozin  (JARDIANCE ) 25 MG TABS tablet Take 1 tablet (25 mg total) by mouth daily. 02/20/24  Yes Newlin, Enobong, MD  EPINEPHrine  0.3 mg/0.3 mL IJ SOAJ injection Inject 0.3 mg into the muscle as needed for anaphylaxis. 10/13/22  Yes Danton Slough M, PA-C  glipiZIDE  (GLUCOTROL  XL) 5 MG 24 hr tablet Take 1 tablet (5 mg total) by mouth daily with breakfast. 02/20/24  Yes Newlin, Enobong, MD  vitamin B-12 (CYANOCOBALAMIN ) 50 MCG tablet Take 50 mcg by mouth daily.   Yes [provider]  dextromethorphan  15 MG/5ML syrup Take 10 mLs (30 mg total) by mouth 4 (four) times daily as needed for cough.  10/27/23   Merilee Andrea CROME, NP    Family History Family History  Adopted: Yes  Problem Relation Age of Onset   Dementia Mother    Heart attack Mother    Stomach cancer Sister 75 - 59   Colon cancer Neg Hx    Colon polyps Neg Hx     Social History Social History   Tobacco Use   Smoking status: Never   Smokeless tobacco: Never  Vaping Use   Vaping status: Never Used  Substance Use Topics   Alcohol use: No   Drug use: No     Allergies   Penicillins, Almond oil, Peanuts [peanut oil], Morphine , Morphine  and codeine, Strawberry extract, and Tramadol    Review of Systems Review of Systems   Physical Exam Triage Vital Signs ED Triage Vitals  Encounter Vitals Group     BP 03/02/24 1223 127/60     Girls Systolic BP Percentile --      Girls Diastolic BP Percentile --      Boys Systolic BP Percentile --      Boys Diastolic BP Percentile --      Pulse Rate  03/02/24 1223 94     Resp 03/02/24 1223 18     Temp 03/02/24 1223 98.1 F (36.7 C)     Temp Source 03/02/24 1223 Oral     SpO2 03/02/24 1223 97 %     Weight --      Height --      Head Circumference --      Peak Flow --      Pain Score 03/02/24 1251 10     Pain Loc --      Pain Education --      Exclude from Growth Chart --    No data found.  Updated Vital Signs BP 127/60 (BP Location: Left Arm)   Pulse 94   Temp 98.1 F (36.7 C) (Oral)   Resp 18   LMP 08/12/2014 (Exact Date)   SpO2 97%   Visual Acuity Right Eye Distance:   Left Eye Distance:   Bilateral Distance:    Right Eye Near:   Left Eye Near:    Bilateral Near:     Physical Exam Vitals and nursing note reviewed.  Constitutional:      General: She is not in acute distress.    Appearance: Normal appearance. She is not ill-appearing, toxic-appearing or diaphoretic.  Eyes:     General: No scleral icterus. Cardiovascular:     Rate and Rhythm: Normal rate and regular rhythm.     Heart sounds: Normal heart sounds.  Pulmonary:     Effort: Pulmonary effort is normal. No respiratory distress.     Breath sounds: Normal breath sounds. No wheezing or rhonchi.  Musculoskeletal:     Left knee: Swelling (mild) and crepitus present. Decreased range of motion. Tenderness present over the patellar tendon.     Comments: Reduced flexion and extension  Skin:    General: Skin is warm.  Neurological:     Mental Status: She is alert and oriented to person, place, and time.  Psychiatric:        Mood and Affect: Mood normal.        Behavior: Behavior normal.      UC Treatments / Results  Labs (all labs ordered are listed, but only abnormal results are displayed) Labs Reviewed - No data to display  EKG   Radiology No results found.  Procedures Procedures (including critical care time)  Medications Ordered in UC Medications  acetaminophen  (TYLENOL ) tablet 975 mg (975 mg Oral Given 03/02/24 1251)    Initial  Impression / Assessment and Plan / UC Course  I have reviewed the triage vital signs and the nursing notes.  Pertinent labs & imaging results that were available during my care of the patient were reviewed by me and considered in my medical decision making (see chart for details).     Arthritis of left knee-relief was achieved in office with use of Tylenol  and ice.  Patient advised to continue supportive treatments. Final Clinical Impressions(s) / UC Diagnoses   Final diagnoses:  Arthritis of left knee     Discharge Instructions      Today you have been diagnosed with a musculoskeletal injury.  Adults may use  1000 mg of Tylenol  together every 6 hours as needed for pain control, no more than 4000 mg daily.  Children may take ibuprofen  and Tylenol  as directed on medication packaging.  You should use ice on affected area for 20 minutes at a time a couple times a day for the first 24 hours then you may switch to heat in the same intervals.  Be sure to put a barrier between ice or heat source and skin to prevent burns.  May also wrap affected area and Ace bandage if tolerated and appropriate, and elevate above the level of the heart to help reduce swelling.  Do not wrap Ace bandages around neck or torso as wrapping too tight can restrict air movement inability to breathe.  If symptoms do not seem to be improving in 3 to 5 days after following these instructions we need to follow-up with orthopedist or PCP.     ED Prescriptions   None    PDMP not reviewed this encounter.   Andra Corean BROCKS, PA-C 03/02/24 1334

## 2024-03-02 NOTE — Discharge Instructions (Signed)
 Today you have been diagnosed with a musculoskeletal injury.  Adults may use  1000 mg of Tylenol  together every 6 hours as needed for pain control, no more than 4000 mg daily.  Children may take ibuprofen  and Tylenol  as directed on medication packaging.  You should use ice on affected area for 20 minutes at a time a couple times a day for the first 24 hours then you may switch to heat in the same intervals.  Be sure to put a barrier between ice or heat source and skin to prevent burns.  May also wrap affected area and Ace bandage if tolerated and appropriate, and elevate above the level of the heart to help reduce swelling.  Do not wrap Ace bandages around neck or torso as wrapping too tight can restrict air movement inability to breathe.  If symptoms do not seem to be improving in 3 to 5 days after following these instructions we need to follow-up with orthopedist or PCP.

## 2024-03-02 NOTE — ED Triage Notes (Signed)
 Patient presents with left knee pain after riding the exercise bike. Patient states she physical therapy a couple times per week.

## 2024-03-04 ENCOUNTER — Other Ambulatory Visit: Payer: Self-pay | Admitting: Licensed Clinical Social Worker

## 2024-03-04 NOTE — Patient Instructions (Signed)
 Visit Information  Thank you for taking time to visit with me today. Please don't hesitate to contact me if I can be of assistance to you before our next scheduled appointment.  Our next appointment is by telephone on 04/16/24 at 3:30 PM   Please call the care guide team at [phone] if you need to cancel or reschedule your appointment.   Following is a copy of your care plan:   Goals Addressed             This Visit's Progress    VBCI Social Work Care Plan       Problems:   Housing issues; seeking repairs to her home              Financial challenges: not working due to injury on the job             Family support (sister is in [place] ).              Job Stress issues (she experienced a fall while at work and has been recovering from fall at work. Receives physical therapy weekly as scheduled)            Reduced family support            Car may need repairs (not always reliable for transport)             Anxiety and stress issues            Ambulation challenges  CSW Clinical Goal(s):   Over the next 30 days the Patient will attend scheduled medical appointments AEB patient report and documentation in EPIC record.              Over next 30 days the Patient will research housing options in the area  AEB client report of same  Interventions:  Discussed client needs and status             Discussed housing needs with client             Provided counseling support for client              Active Listening to allow client to share her needs and concerns             Discussed medication procurement of client              Informed client previously  of [facility] in [place], [place] for counseling support. Informed client of [facility] in [place], [place]) for counseling support, outpatient. She said she was familiar with that agency and had a relative that had received support at that agency               Discussed pain  issues of client left foot. Discussed sleeping issues. Discussed financial stressors.Discussed support from church friends               Discussed client transport needs               Discussed physical therapy sessions. Client said PT sessions for her are now complete. She goes to [facility].                   Provided counseling support               Encouraged client to call LCSW for SW support at [phone]  Thanked client for phone call with LCSW today  Patient Goals/Self-Care Activities:  Attend scheduled medical appointments            Take medications as prescribed             Practice self care with getting adequate rest, eating meals on schedule and following daily routine              Call LCSW as needed for SW support              Call HR rep at her job to discuss worker's compensation claim for client              Use coping skills to manage stress or anxiety issues faced   Plan:   LCSW to call client on 04/16/24 at 3:30 PM         Please go to [facility] [address], [place] [phone]) if you are experiencing a Mental Health or Behavioral Health Crisis or need someone to talk to.  The patient verbalized understanding of instructions, educational materials, and care plan provided today and DECLINED offer to receive copy of patient instructions, educational materials, and care plan.     Glendia Pear  MSW, LCSW /[facility] Licensed Clinical Social Worker Direct Dial:  [phone] Fax:  [phone] Website:  [email or web address]

## 2024-03-04 NOTE — Patient Outreach (Signed)
 Complex Care Management   Visit Note  03/04/2024  Name:  Heather Cook MRN: [id number] DOB: [birth year]  Situation: Referral received for Complex Care Management related to SDOH needs; anxiety issues I obtained verbal consent from Patient.  Visit completed with Patient  on the phone  Background:   Past Medical History:  Diagnosis Date   Arthritis    Blood transfusion without reported diagnosis    at age 63   Diabetes mellitus without complication (HCC)    GERD (gastroesophageal reflux disease)    H. pylori infection    History of stomach ulcers    Hyperlipidemia    Sciatica    Stroke [facility])     Assessment: Patient Reported Symptoms:  Cognitive Cognitive Status: Alert and oriented to person, place, and time Cognitive/Intellectual Conditions Management [RPT]: None reported or documented in medical history or problem list   Health Maintenance Behaviors: Sleep adequate, Annual physical exam  Neurological Neurological Review of Symptoms: Headaches, Weakness Neurological Management Strategies: Adequate rest  HEENT HEENT Symptoms Reported: No symptoms reported HEENT Management Strategies: Adequate rest    Cardiovascular Cardiovascular Symptoms Reported: Fatigue Cardiovascular Management Strategies: Adequate rest  Respiratory Respiratory Symptoms Reported: Dry cough Other Respiratory Symptoms: cough Respiratory Management Strategies: Adequate rest  Endocrine Endocrine Symptoms Reported: Weakness or fatigue, Headaches Is patient diabetic?: Yes    Gastrointestinal Gastrointestinal Symptoms Reported: No symptoms reported Gastrointestinal Management Strategies: Adequate rest    Genitourinary Genitourinary Symptoms Reported: Frequency Genitourinary Management Strategies: Adequate rest  Integumentary Integumentary Symptoms Reported: No symptoms reported    Musculoskeletal Musculoskelatal Symptoms Reviewed: Muscle pain, Unsteady gait, Weakness Musculoskeletal Management Strategies:  Adequate rest      Psychosocial Psychosocial Symptoms Reported: Sadness - if selected complete PHQ 2-9, Depression - if selected complete PHQ 2-9, Anxiety - if selected complete GAD Additional Psychological Details: housing issues Behavioral Management Strategies: Coping strategies Major Change/Loss/Stressor/Fears (CP): Medical condition, self Techniques to Cope with Loss/Stress/Change: Counseling Quality of Family Relationships: supportive    03/04/2024    PHQ2-9 Depression Screening   Little interest or pleasure in doing things Several days  Feeling down, depressed, or hopeless More than half the days  PHQ-2 - Total Score 3  Trouble falling or staying asleep, or sleeping too much Several days  Feeling tired or having little energy Several days  Poor appetite or overeating  Several days  Feeling bad about yourself - or that you are a failure or have let yourself or your family down Several days  Trouble concentrating on things, such as reading the newspaper or watching television Several days  Moving or speaking so slowly that other people could have noticed.  Or the opposite - being so fidgety or restless that you have been moving around a lot more than usual Several days  Thoughts that you would be better off dead, or hurting yourself in some way Not at all  PHQ2-9 Total Score 9  If you checked off any problems, how difficult have these problems made it for you to do your work, take care of things at home, or get along with other people Somewhat difficult  Depression Interventions/Treatment Counseling    Vitals:   BP level fluctuates per client information  Medications Reviewed Today     Reviewed by Frances Ozell GORMAN KEN (Social Worker) on 03/04/24 at [phone]  Med List Status: <None>   Medication Order Taking? Sig Documenting Provider Last Dose Status Informant  aspirin  EC 81 MG tablet [id number] Yes Take 1 tablet (81 mg total) by  mouth daily. Swallow whole. Newlin, Enobong, MD   Active   atorvastatin  (LIPITOR) 40 MG tablet [id number] Yes Take 1 tablet (40 mg total) by mouth daily. Newlin, Enobong, MD  Active   baclofen  (LIORESAL ) 10 MG tablet [id number] Yes Take 1 tablet (10 mg total) by mouth 3 (three) times daily. Enedelia Dorna HERO, FNP  Active   cetirizine  (ZYRTEC ) 10 MG tablet [id number] Yes Take 1 tablet (10 mg total) by mouth daily. Newlin, Enobong, MD  Active   clotrimazole  (LOTRIMIN ) 1 % cream [id number] Not taking  Apply 1 Application topically 2 (two) times daily.  Patient not taking: Reported on 03/04/2024   Newlin, Enobong, MD  Active   dextromethorphan  15 MG/5ML syrup [id number] Unknown  Take 10 mLs (30 mg total) by mouth 4 (four) times daily as needed for cough. Merilee Andrea CROME, NP  Active   diclofenac  Sodium (VOLTAREN ) 1 % GEL [id number] Yes Apply 2 grams to the affected area(s) topically 2 to 3 times a day.   Active   empagliflozin  (JARDIANCE ) 25 MG TABS tablet [id number] Yes Take 1 tablet (25 mg total) by mouth daily. Newlin, Enobong, MD  Active   EPINEPHrine  0.3 mg/0.3 mL IJ SOAJ injection [id number] Yes Inject 0.3 mg into the muscle as needed for anaphylaxis. Danton Slough M, PA-C  Active   glipiZIDE  (GLUCOTROL  XL) 5 MG 24 hr tablet [id number] Yes Take 1 tablet (5 mg total) by mouth daily with breakfast. Newlin, Enobong, MD  Active   vitamin B-12 (CYANOCOBALAMIN ) 50 MCG tablet [id number] Yes Take 50 mcg by mouth daily. [provider]  Active             Recommendation:   PCP Follow-up Continue Current Plan of Care Research housing options in the area  Follow Up Plan:   Telephone follow up appointment date/time:  04/16/24 at 3:30 PM    Glendia Pear  MSW, LCSW [place]/Value Based Care [facility] Licensed Clinical Social Worker Direct Dial:  [phone] Fax:  [phone] Website:  [email or web address]

## 2024-04-11 ENCOUNTER — Other Ambulatory Visit: Payer: Self-pay

## 2024-04-16 ENCOUNTER — Telehealth: Admitting: Licensed Clinical Social Worker

## 2024-04-29 ENCOUNTER — Other Ambulatory Visit: Payer: Self-pay | Admitting: Licensed Clinical Social Worker

## 2024-04-29 DIAGNOSIS — K219 Gastro-esophageal reflux disease without esophagitis: Secondary | ICD-10-CM

## 2024-04-29 NOTE — Patient Outreach (Signed)
 Complex Care Management   Visit Note  04/29/2024  Name:  Heather Cook MRN: [id number] DOB: [birth year]  Situation: Referral received for Complex Care Management related to mental health needs; housing needs I obtained verbal consent from Patient.  Visit completed with Patient  on the phone  Background:   Past Medical History:  Diagnosis Date   Arthritis    Blood transfusion without reported diagnosis    at age 63   Diabetes mellitus without complication (HCC)    GERD (gastroesophageal reflux disease)    H. pylori infection    History of stomach ulcers    Hyperlipidemia    Sciatica    Stroke [facility])     Assessment: Patient Reported Symptoms:  Cognitive Cognitive Status: Alert and oriented to person, place, and time, Able to follow simple commands Cognitive/Intellectual Conditions Management [RPT]: None reported or documented in medical history or problem list   Health Maintenance Behaviors: Stress management Health Facilitated by: Stress management  Neurological Neurological Review of Symptoms: Weakness, Headaches Neurological Management Strategies: Adequate rest  HEENT HEENT Symptoms Reported: No symptoms reported HEENT Management Strategies: Coping strategies    Cardiovascular Cardiovascular Symptoms Reported: Fatigue Cardiovascular Management Strategies: Adequate rest, Coping strategies  Respiratory Respiratory Symptoms Reported: Dry cough Respiratory Management Strategies: Adequate rest, Coping strategies  Endocrine Endocrine Symptoms Reported: Headaches, Weakness or fatigue Is patient diabetic?: Yes    Gastrointestinal Gastrointestinal Symptoms Reported: No symptoms reported Gastrointestinal Management Strategies: Adequate rest    Genitourinary Genitourinary Symptoms Reported: Frequency Genitourinary Management Strategies: Adequate rest, Coping strategies  Integumentary Integumentary Symptoms Reported: No symptoms reported Skin Management Strategies: Coping strategies   Musculoskeletal Musculoskelatal Symptoms Reviewed: Muscle pain, Unsteady gait, Weakness, Difficulty walking Additional Musculoskeletal Details: injury to left foot Musculoskeletal Management Strategies: Coping strategies      Psychosocial Psychosocial Symptoms Reported: Sadness - if selected complete PHQ 2-9, Depression - if selected complete PHQ 2-9 Additional Psychological Details: housing issues; currently staying with friends Behavioral Management Strategies: Coping strategies Major Change/Loss/Stressor/Fears (CP): Medical condition, self Techniques to Cope with Loss/Stress/Change: Diversional activities, Counseling, Support group Quality of Family Relationships: supportive Do you feel physically threatened by others?: No    04/29/2024    PHQ2-9 Depression Screening   Little interest or pleasure in doing things Several days  Feeling down, depressed, or hopeless Several days  PHQ-2 - Total Score 2  Trouble falling or staying asleep, or sleeping too much Several days  Feeling tired or having little energy Several days  Poor appetite or overeating  Several days  Feeling bad about yourself - or that you are a failure or have let yourself or your family down Several days  Trouble concentrating on things, such as reading the newspaper or watching television Several days  Moving or speaking so slowly that other people could have noticed.  Or the opposite - being so fidgety or restless that you have been moving around a lot more than usual Several days  Thoughts that you would be better off dead, or hurting yourself in some way Not at all  PHQ2-9 Total Score 8  If you checked off any problems, how difficult have these problems made it for you to do your work, take care of things at home, or get along with other people Somewhat difficult  Depression Interventions/Treatment Counseling    Today's Vitals  Client did not mention any BP issues during call with LCSW today   Pain Scale:  0-10 Pain Score: 5  Pain Type: Chronic pain Pain Location: Foot  Pain Descriptors / Indicators: Aching Pain Onset: Gradual Patients Stated Pain Goal: 1 Pain Intervention(s): Relaxation  Medications Reviewed Today   Medications were not reviewed in this encounter     Recommendation:   PCP Follow-up Continue Current Plan of Care Client to contact counseling agency of choice to discuss counseling support for client (LCSW has given client information on [facility] and on [facility]) Allow time for ADLs completion Allow time for rest and relaxation activities  Follow Up Plan:   Telephone follow up appointment date/time:  06/03/2024 at 9:00 AM    Glendia Pear  MSW, LCSW [place]/Value Based Care [facility] Licensed Clinical Social Worker Direct Dial:  [phone] Fax:  [phone] Website:  [email or web address]

## 2024-04-29 NOTE — Patient Instructions (Signed)
 Visit Information  Thank you for taking time to visit with me today. Please don't hesitate to contact me if I can be of assistance to you before our next scheduled appointment.  Our next appointment is by telephone on 06/03/2024 at 9:00 AM   Please call the care guide team at [phone] if you need to cancel or reschedule your appointment.   Following is a copy of your care plan:   Goals Addressed             This Visit's Progress    VBCI Social Work Care Plan       Problems:   Housing issues; seeking repairs to her home              Financial challenges: not working due to injury on the job             Family support (sister is in [place] ).              Job Stress issues (she experienced a fall while at work and has been recovering from fall at work.            Reduced family support            Car may need repairs (not always reliable for transport)             Anxiety and stress issues            Ambulation challenges  CSW Clinical Goal(s):   Over the next 30 days the Patient will attend scheduled medical appointments AEB patient report and documentation in EPIC record.              Over next 30 days the Patient will research housing options in the area  AEB client report of same (client said she is currently residing from friends from her church but is interested in locating new residence)  Interventions:  Discussed client needs and status             Discussed housing needs with client             Provided counseling support for client              Active Listening to allow client to share her needs and concerns             Discussed medication procurement of client              Informed client previously  of [facility] in [place], [place] for counseling support. Informed client of [facility] in [place], [place]) for counseling support, outpatient. She said she was familiar with that agency and had a relative  that had received support at that agency               Discussed pain issues of client left foot.  Discussed client injury to her knee. She said she had appointment today with Orthopedic doctor.                  Discussed sleeping issues. Discussed financial stressors.Discussed support from church friends. She said she is still in process of searching for new residence where she can reside                Discussed physical therapy sessions. Client said PT sessions for her are now complete.                Client  said she sometimes has reduced sleep. She said she sometimes has low energy. Completed PHQ 2/9. Completed GAD-7.  LCSW has discussed counseling resources in [place], [place] with client but client has not said she is attending any current counseling support sessions. She did say she had a friend who received support with [facility]. She may be open more to seeking counseling support with [facility]. LCSW has given her information previously about [facility]               Discussed her communication with HR staff at her previous job               Client had low energy during call and was waiting to talk with Orthopedic doctor for appointment today. LCSW encouraged Naija to call LCSW as needed for SW support at [phone]                [facility] client for phone call with LCSW today  Patient Goals/Self-Care Activities:  Attend scheduled medical appointments            Take medications as prescribed             Practice self care with getting adequate rest, eating meals on schedule and following daily routine              Call LCSW as needed for SW support              Call HR rep at her job to discuss worker's compensation claim for client              Use coping skills to manage stress or anxiety issues faced   Plan:   LCSW to call client on 06/03/24 at 9:00 AM          Please go to [facility] [address]  [place] [phone]) if you are experiencing a Mental Health or Behavioral Health Crisis or need someone to talk to.  Patient verbalized understanding of Care plan and visit instructions communicated this visit   Glendia Pear  MSW, LCSW [place]/Value Based Care [facility] Licensed Clinical Social Worker Direct Dial:  [phone] Fax:  [phone] Website:  [email or web address]

## 2024-04-30 ENCOUNTER — Ambulatory Visit: Admitting: Physician Assistant

## 2024-05-14 ENCOUNTER — Telehealth: Payer: Self-pay

## 2024-05-14 ENCOUNTER — Other Ambulatory Visit: Payer: Self-pay

## 2024-05-14 NOTE — Progress Notes (Signed)
° °  Telephone encounter was:  Unsuccessful.  05/14/2024 Name: Heather Cook MRN: [id number] DOB: 12-17-1960  Unsuccessful outbound call made today to assist with:  Financial Difficulties related to financial strain  Outreach Attempt:  1st Attempt  No answer and unable to leave a message    [facility]  [facility] Guide, Phone: [phone] Fax: [phone] Website: [email or web address]

## 2024-05-21 ENCOUNTER — Ambulatory Visit: Payer: Self-pay | Admitting: Family Medicine

## 2024-05-27 ENCOUNTER — Telehealth: Payer: Self-pay

## 2024-05-27 NOTE — Progress Notes (Signed)
" ° °  Telephone encounter was:  Successful.  Complex Care Management Note Care Guide Note  05/27/2024 Name: Heather Cook MRN: [id number] DOB: 03/04/1961  Heather Cook is a 63 y.o. year old female who is a primary care patient of Newlin, Enobong, MD . The community resource team was consulted for assistance with Food Insecurity, Financial Difficulties related to financial strain, and Housing   SDOH screenings and interventions completed:  Yes  Social Drivers of Health From This Encounter   Food Insecurity: Food Insecurity Present (05/27/2024)   Epic    Worried About Programme Researcher, Broadcasting/film/video in the Last Year: Often true    Ran Out of Food in the Last Year: Often true  Housing: High Risk (05/27/2024)   Epic    Unable to Pay for Housing in the Last Year: Yes    Homeless in the Last Year: Yes  Financial Resource Strain: High Risk (05/27/2024)   Overall Financial Resource Strain (CARDIA)    Difficulty of Paying Living Expenses: Very hard  Utilities: At Risk (05/27/2024)   Epic    Threatened with loss of utilities: Yes    SDOH Interventions Today    Flowsheet Row Most Recent Value  SDOH Interventions   Food Insecurity Interventions Community Resources Provided, [place] Resources Referral  Housing Interventions Community Resources Provided, [place] Resource Referral  Utilities Interventions Community Resources Provided, [place] Resource Referral  Financial Strain Interventions Community Resources Provided, [employer] Referral     Care guide performed the following interventions: Patient provided with information about care guide support team and interviewed to confirm resource needs.Pt is needing housing resources for [employer] Pt requested I mail resources to her.   Follow Up Plan:  No further follow up planned at this time. The patient has been provided with needed resources.  Encounter Outcome:  Patient Visit Completed    [facility]  [facility] Guide, Phone: [phone] Fax: [phone] Website: [email or web address]    "

## 2024-06-03 ENCOUNTER — Other Ambulatory Visit: Payer: Self-pay | Admitting: Licensed Clinical Social Worker

## 2024-06-03 NOTE — Patient Outreach (Signed)
 Complex Care Management   Visit Note  06/03/2024  Name:  Heather Cook MRN: [id number] DOB: [birth year]  Situation: Referral received for Complex Care Management related to anxiety issues; sadness. Housing needs I obtained verbal consent from Patient.  Visit completed with Patient  on the phone  Background:   Past Medical History:  Diagnosis Date   Arthritis    Blood transfusion without reported diagnosis    at age 64   Diabetes mellitus without complication (HCC)    GERD (gastroesophageal reflux disease)    H. pylori infection    History of stomach ulcers    Hyperlipidemia    Sciatica    Stroke [facility])     Assessment: Patient Reported Symptoms:  Cognitive Cognitive Status: Alert and oriented to person, place, and time, Able to follow simple commands Cognitive/Intellectual Conditions Management [RPT]: None reported or documented in medical history or problem list   Health Maintenance Behaviors: Stress management Health Facilitated by: Stress management  Neurological Neurological Review of Symptoms: Weakness, Headaches Neurological Management Strategies: Adequate rest, Coping strategies  HEENT HEENT Symptoms Reported: No symptoms reported HEENT Management Strategies: Coping strategies    Cardiovascular Cardiovascular Symptoms Reported: Fatigue Cardiovascular Management Strategies: Coping strategies, Adequate rest  Respiratory Respiratory Symptoms Reported: No symptoms reported Respiratory Management Strategies: Coping strategies  Endocrine Endocrine Symptoms Reported: Headaches, Weakness or fatigue Is patient diabetic?: Yes    Gastrointestinal Gastrointestinal Symptoms Reported: No symptoms reported Gastrointestinal Management Strategies: Adequate rest, Coping strategies    Genitourinary Genitourinary Symptoms Reported: Frequency Genitourinary Management Strategies: Coping strategies  Integumentary Integumentary Symptoms Reported: No symptoms reported Skin Management  Strategies: Coping strategies  Musculoskeletal Musculoskelatal Symptoms Reviewed: Muscle pain, Unsteady gait, Difficulty walking Musculoskeletal Management Strategies: Coping strategies  May have decreased mobility.  She spoke of back pain, muscle pain , left foot pain    Psychosocial Psychosocial Symptoms Reported: Sadness - if selected complete PHQ 2-9, Anxiety - if selected complete GAD Additional Psychological Details: Housing issues; looking for new housing location Behavioral Management Strategies: Coping strategies Major Change/Loss/Stressor/Fears (CP): Medical condition, self Techniques to Cope with Loss/Stress/Change: Diversional activities, Counseling Quality of Family Relationships: unable to assess Do you feel physically threatened by others?: No    06/03/2024    PHQ2-9 Depression Screening   Little interest or pleasure in doing things Several days  Feeling down, depressed, or hopeless Several days  PHQ-2 - Total Score 2  Trouble falling or staying asleep, or sleeping too much Several days  Feeling tired or having little energy Several days  Poor appetite or overeating  Several days  Feeling bad about yourself - or that you are a failure or have let yourself or your family down Several days  Trouble concentrating on things, such as reading the newspaper or watching television Several days  Moving or speaking so slowly that other people could have noticed.  Or the opposite - being so fidgety or restless that you have been moving around a lot more than usual Several days  Thoughts that you would be better off dead, or hurting yourself in some way Not at all  PHQ2-9 Total Score 8  If you checked off any problems, how difficult have these problems made it for you to do your work, take care of things at home, or get along with other people Somewhat difficult  Depression Interventions/Treatment Counseling    Today's Vitals  Client did not mention any BP problems during call today  with LCSW  Pain Scale: 0-10 Pain Score: 5  Pain Type: Chronic pain Pain Location: Foot Pain Orientation: Left Pain Descriptors / Indicators: Aching Patients Stated Pain Goal: 1 Pain Intervention(s): Relaxation  Medications Reviewed Today   Medications were not reviewed in this encounter     Recommendation:   PCP Follow-up Continue Current Plan of Care Call LCSW as needed for SW support Continue to apply for housing locations in area; continue to research housing options in area Allow time for rest and relaxation; allow time for ADLs completion  Follow Up Plan:   LCSW to call client on 07/08/2024 at 9:00 AM   Heather Cook  MSW, LCSW /Value Based Care [facility] Licensed Clinical Social Worker Direct Dial:  [phone] Fax:  [phone] Website:  [email or web address]

## 2024-06-03 NOTE — Patient Instructions (Signed)
 Visit Information  Thank you for taking time to visit with me today. Please don't hesitate to contact me if I can be of assistance to you before our next scheduled appointment.  Our next appointment is by telephone on 07/08/2024 at 9:00 AM   Please call the care guide team at [phone] if you need to cancel or reschedule your appointment.   Following is a copy of your care plan:   Goals Addressed             This Visit's Progress    VBCI Social Work Care Plan   No change    Problems:   Housing issues; seeking repairs to her home              Financial challenges: not working due to injury on the job             Family support (sister is in [place] ).              Job Stress issues (she experienced a fall while at work and has been recovering from fall at work.            Reduced family support            Car may need repairs (not always reliable for transport)             Anxiety and stress issues            Ambulation challenges  CSW Clinical Goal(s):   Over the next 30 days the Patient will attend scheduled medical appointments AEB patient report and documentation in EPIC record.              Over next 30 days the Patient will research housing options in the area  AEB client report of same (client said she is currently residing from friends from her church but is interested in locating new residence)  Interventions:  Discussed client needs and status             Discussed housing needs with client. She said she is in process of applying to several different housing locations. She is still trying to locate a new housing location. Still residing with friends at present.              Provided counseling support for client              Active Listening to allow client to share her needs and concerns             Discussed medication procurement of client            Discussed pain issues of client. She said she recently had a MRI completed .              Discussed support from  church friends.                Discussed physical therapy sessions. Client said PT sessions for her are now complete.                Completed PHQ 2/9. Completed GAD-7.  LCSW has discussed counseling resources in [place], [place] with client but client has not said she is attending any current counseling support sessions. She did say she had a friend who received support with [facility]. She may be open more to seeking counseling support with [facility]. LCSW has given her information previously about [facility].  Discussed family support.               LCSW encouraged [place] to call LCSW as needed for SW support at [phone]                [facility] client for phone call with LCSW today  Patient Goals/Self-Care Activities:  Attend scheduled medical appointments            Take medications as prescribed             Practice self care with getting adequate rest, eating meals on schedule and following daily routine              Call LCSW as needed for SW support              Call HR rep at her job to discuss worker's compensation claim for client              Use coping skills to manage stress or anxiety issues faced   Plan:   LCSW to call client on 07/08/2024 at 9:00 AM          Please go to [facility] [address], [place] [phone]) if you are experiencing a Mental Health or Behavioral Health Crisis or need someone to talk to.  Patient verbalized understanding of Care plan and visit instructions communicated this visit   Glendia Pear  MSW, LCSW [place]/Value Based Care [facility] Licensed Clinical Social Worker Direct Dial:  [phone] Fax:  [phone] Website:  [email or web address]

## 2024-07-08 ENCOUNTER — Telehealth: Admitting: Licensed Clinical Social Worker

## 2024-07-10 ENCOUNTER — Ambulatory Visit: Payer: Self-pay | Admitting: Family Medicine

## 2024-07-15 ENCOUNTER — Ambulatory Visit: Admitting: Physician Assistant
